# Patient Record
Sex: Male | Born: 1955
Health system: Southern US, Community
[De-identification: ages and names within clinical notes are randomized; demographics above are authoritative.]

## PROBLEM LIST (undated history)

## (undated) DIAGNOSIS — K219 Gastro-esophageal reflux disease without esophagitis: Secondary | ICD-10-CM

## (undated) DIAGNOSIS — B1920 Unspecified viral hepatitis C without hepatic coma: Secondary | ICD-10-CM

## (undated) DIAGNOSIS — F17200 Nicotine dependence, unspecified, uncomplicated: Secondary | ICD-10-CM

## (undated) DIAGNOSIS — F1010 Alcohol abuse, uncomplicated: Secondary | ICD-10-CM

## (undated) DIAGNOSIS — Z87892 Personal history of anaphylaxis: Secondary | ICD-10-CM

## (undated) DIAGNOSIS — I5042 Chronic combined systolic (congestive) and diastolic (congestive) heart failure: Secondary | ICD-10-CM

## (undated) DIAGNOSIS — F1410 Cocaine abuse, uncomplicated: Secondary | ICD-10-CM

## (undated) DIAGNOSIS — G4730 Sleep apnea, unspecified: Secondary | ICD-10-CM

## (undated) DIAGNOSIS — I428 Other cardiomyopathies: Secondary | ICD-10-CM

## (undated) DIAGNOSIS — I10 Essential (primary) hypertension: Secondary | ICD-10-CM

## (undated) HISTORY — DX: Morbid (severe) obesity due to excess calories: E66.01

## (undated) HISTORY — DX: Cocaine abuse, uncomplicated: F14.10

## (undated) HISTORY — DX: Nicotine dependence, unspecified, uncomplicated: F17.200

## (undated) HISTORY — DX: Personal history of anaphylaxis: Z87.892

## (undated) HISTORY — DX: Unspecified viral hepatitis C without hepatic coma: B19.20

## (undated) HISTORY — DX: Alcohol abuse, uncomplicated: F10.10

## (undated) HISTORY — PX: NO PAST SURGERIES: SHX2092

## (undated) HISTORY — DX: Other cardiomyopathies: I42.8

## (undated) HISTORY — DX: Chronic combined systolic (congestive) and diastolic (congestive) heart failure: I50.42

---

## 2000-11-13 ENCOUNTER — Emergency Department (HOSPITAL_COMMUNITY): Admission: EM | Admit: 2000-11-13 | Discharge: 2000-11-13 | Payer: Self-pay | Admitting: Emergency Medicine

## 2001-06-13 ENCOUNTER — Emergency Department (HOSPITAL_COMMUNITY): Admission: EM | Admit: 2001-06-13 | Discharge: 2001-06-13 | Payer: Self-pay

## 2002-04-21 ENCOUNTER — Emergency Department (HOSPITAL_COMMUNITY): Admission: EM | Admit: 2002-04-21 | Discharge: 2002-04-21 | Payer: Self-pay | Admitting: Emergency Medicine

## 2002-04-21 ENCOUNTER — Encounter: Payer: Self-pay | Admitting: Emergency Medicine

## 2002-12-11 ENCOUNTER — Emergency Department (HOSPITAL_COMMUNITY): Admission: AD | Admit: 2002-12-11 | Discharge: 2002-12-11 | Payer: Self-pay | Admitting: Family Medicine

## 2003-03-17 ENCOUNTER — Emergency Department (HOSPITAL_COMMUNITY): Admission: EM | Admit: 2003-03-17 | Discharge: 2003-03-17 | Payer: Self-pay | Admitting: Emergency Medicine

## 2003-04-17 ENCOUNTER — Emergency Department (HOSPITAL_COMMUNITY): Admission: EM | Admit: 2003-04-17 | Discharge: 2003-04-17 | Payer: Self-pay | Admitting: Emergency Medicine

## 2003-05-04 ENCOUNTER — Emergency Department (HOSPITAL_COMMUNITY): Admission: EM | Admit: 2003-05-04 | Discharge: 2003-05-04 | Payer: Self-pay | Admitting: Emergency Medicine

## 2003-05-21 ENCOUNTER — Emergency Department (HOSPITAL_COMMUNITY): Admission: EM | Admit: 2003-05-21 | Discharge: 2003-05-21 | Payer: Self-pay

## 2003-09-22 ENCOUNTER — Emergency Department (HOSPITAL_COMMUNITY): Admission: EM | Admit: 2003-09-22 | Discharge: 2003-09-22 | Payer: Self-pay | Admitting: Emergency Medicine

## 2004-02-20 ENCOUNTER — Emergency Department (HOSPITAL_COMMUNITY): Admission: EM | Admit: 2004-02-20 | Discharge: 2004-02-20 | Payer: Self-pay | Admitting: Emergency Medicine

## 2004-05-25 ENCOUNTER — Emergency Department (HOSPITAL_COMMUNITY): Admission: EM | Admit: 2004-05-25 | Discharge: 2004-05-25 | Payer: Self-pay | Admitting: Emergency Medicine

## 2005-05-07 ENCOUNTER — Emergency Department (HOSPITAL_COMMUNITY): Admission: EM | Admit: 2005-05-07 | Discharge: 2005-05-07 | Payer: Self-pay | Admitting: Emergency Medicine

## 2005-05-31 ENCOUNTER — Emergency Department (HOSPITAL_COMMUNITY): Admission: EM | Admit: 2005-05-31 | Discharge: 2005-05-31 | Payer: Self-pay | Admitting: Emergency Medicine

## 2005-06-06 ENCOUNTER — Emergency Department (HOSPITAL_COMMUNITY): Admission: EM | Admit: 2005-06-06 | Discharge: 2005-06-06 | Payer: Self-pay | Admitting: Emergency Medicine

## 2005-08-18 ENCOUNTER — Emergency Department (HOSPITAL_COMMUNITY): Admission: EM | Admit: 2005-08-18 | Discharge: 2005-08-18 | Payer: Self-pay | Admitting: Emergency Medicine

## 2005-12-12 ENCOUNTER — Emergency Department (HOSPITAL_COMMUNITY): Admission: EM | Admit: 2005-12-12 | Discharge: 2005-12-12 | Payer: Self-pay | Admitting: Emergency Medicine

## 2006-08-20 ENCOUNTER — Emergency Department (HOSPITAL_COMMUNITY): Admission: EM | Admit: 2006-08-20 | Discharge: 2006-08-20 | Payer: Self-pay | Admitting: Emergency Medicine

## 2006-08-28 ENCOUNTER — Emergency Department (HOSPITAL_COMMUNITY): Admission: EM | Admit: 2006-08-28 | Discharge: 2006-08-28 | Payer: Self-pay | Admitting: Emergency Medicine

## 2006-09-26 ENCOUNTER — Emergency Department (HOSPITAL_COMMUNITY): Admission: EM | Admit: 2006-09-26 | Discharge: 2006-09-26 | Payer: Self-pay | Admitting: Emergency Medicine

## 2006-12-19 ENCOUNTER — Emergency Department (HOSPITAL_COMMUNITY): Admission: EM | Admit: 2006-12-19 | Discharge: 2006-12-19 | Payer: Self-pay | Admitting: Emergency Medicine

## 2007-07-11 ENCOUNTER — Emergency Department (HOSPITAL_COMMUNITY): Admission: EM | Admit: 2007-07-11 | Discharge: 2007-07-11 | Payer: Self-pay | Admitting: Emergency Medicine

## 2008-02-07 ENCOUNTER — Emergency Department (HOSPITAL_COMMUNITY): Admission: EM | Admit: 2008-02-07 | Discharge: 2008-02-07 | Payer: Self-pay | Admitting: Emergency Medicine

## 2008-02-11 ENCOUNTER — Emergency Department (HOSPITAL_COMMUNITY): Admission: EM | Admit: 2008-02-11 | Discharge: 2008-02-11 | Payer: Self-pay | Admitting: Emergency Medicine

## 2008-10-03 ENCOUNTER — Emergency Department (HOSPITAL_COMMUNITY): Admission: EM | Admit: 2008-10-03 | Discharge: 2008-10-03 | Payer: Self-pay | Admitting: Emergency Medicine

## 2008-11-03 ENCOUNTER — Emergency Department (HOSPITAL_COMMUNITY): Admission: EM | Admit: 2008-11-03 | Discharge: 2008-11-03 | Payer: Self-pay | Admitting: Emergency Medicine

## 2009-02-20 ENCOUNTER — Observation Stay (HOSPITAL_COMMUNITY): Admission: EM | Admit: 2009-02-20 | Discharge: 2009-02-22 | Payer: Self-pay | Admitting: Emergency Medicine

## 2009-02-21 ENCOUNTER — Encounter (INDEPENDENT_AMBULATORY_CARE_PROVIDER_SITE_OTHER): Payer: Self-pay | Admitting: Internal Medicine

## 2009-02-22 ENCOUNTER — Encounter (INDEPENDENT_AMBULATORY_CARE_PROVIDER_SITE_OTHER): Payer: Self-pay | Admitting: Family Medicine

## 2009-05-15 ENCOUNTER — Encounter (INDEPENDENT_AMBULATORY_CARE_PROVIDER_SITE_OTHER): Payer: Self-pay | Admitting: Emergency Medicine

## 2009-05-15 ENCOUNTER — Inpatient Hospital Stay (HOSPITAL_COMMUNITY): Admission: EM | Admit: 2009-05-15 | Discharge: 2009-05-22 | Payer: Self-pay | Admitting: Emergency Medicine

## 2009-05-15 ENCOUNTER — Ambulatory Visit: Payer: Self-pay | Admitting: Cardiology

## 2009-05-15 DIAGNOSIS — I428 Other cardiomyopathies: Secondary | ICD-10-CM

## 2009-05-15 LAB — CONVERTED CEMR LAB
Cholesterol: 191 mg/dL
HDL: 33 mg/dL
LDL Cholesterol: 104 mg/dL
TSH: 1.073 microintl units/mL
Total CHOL/HDL Ratio: 5.8
Triglycerides: 271 mg/dL
VLDL: 54 mg/dL

## 2009-05-20 LAB — CONVERTED CEMR LAB
HCT: 38.7 %
Hemoglobin: 13.1 g/dL
MCHC: 33.9 g/dL
MCV: 94.3 fL
Platelets: 286 10*3/uL
RBC: 4.1 M/uL
RDW: 14.1 %
WBC: 6.9 10*3/uL

## 2009-05-21 LAB — CONVERTED CEMR LAB
BUN: 14 mg/dL
CO2: 26 meq/L
Calcium: 10.1 mg/dL
Chloride: 101 meq/L
Creatinine, Ser: 1.23 mg/dL
Glucose, Bld: 101 mg/dL
Potassium: 4.4 meq/L
Sodium: 135 meq/L

## 2009-05-23 DIAGNOSIS — I5021 Acute systolic (congestive) heart failure: Secondary | ICD-10-CM

## 2009-06-04 ENCOUNTER — Ambulatory Visit: Payer: Self-pay | Admitting: Internal Medicine

## 2009-06-04 ENCOUNTER — Encounter (INDEPENDENT_AMBULATORY_CARE_PROVIDER_SITE_OTHER): Payer: Self-pay | Admitting: Nurse Practitioner

## 2009-06-04 DIAGNOSIS — F1010 Alcohol abuse, uncomplicated: Secondary | ICD-10-CM

## 2009-06-04 DIAGNOSIS — I10 Essential (primary) hypertension: Secondary | ICD-10-CM

## 2009-06-04 DIAGNOSIS — F17200 Nicotine dependence, unspecified, uncomplicated: Secondary | ICD-10-CM

## 2009-06-04 DIAGNOSIS — N179 Acute kidney failure, unspecified: Secondary | ICD-10-CM | POA: Insufficient documentation

## 2009-06-08 ENCOUNTER — Ambulatory Visit: Payer: Self-pay | Admitting: Cardiology

## 2009-06-09 ENCOUNTER — Emergency Department (HOSPITAL_COMMUNITY): Admission: EM | Admit: 2009-06-09 | Discharge: 2009-06-10 | Payer: Self-pay | Admitting: Emergency Medicine

## 2009-06-10 ENCOUNTER — Telehealth: Payer: Self-pay | Admitting: Cardiology

## 2009-06-15 ENCOUNTER — Encounter (INDEPENDENT_AMBULATORY_CARE_PROVIDER_SITE_OTHER): Payer: Self-pay | Admitting: Nurse Practitioner

## 2009-06-21 ENCOUNTER — Telehealth (INDEPENDENT_AMBULATORY_CARE_PROVIDER_SITE_OTHER): Payer: Self-pay | Admitting: *Deleted

## 2009-06-21 ENCOUNTER — Encounter: Payer: Self-pay | Admitting: Cardiology

## 2009-06-22 ENCOUNTER — Encounter: Payer: Self-pay | Admitting: Cardiology

## 2009-06-25 ENCOUNTER — Encounter (INDEPENDENT_AMBULATORY_CARE_PROVIDER_SITE_OTHER): Payer: Self-pay | Admitting: Nurse Practitioner

## 2009-06-29 ENCOUNTER — Ambulatory Visit: Payer: Self-pay | Admitting: Cardiology

## 2009-07-06 LAB — CONVERTED CEMR LAB
Calcium: 9.8 mg/dL (ref 8.4–10.5)
Creatinine, Ser: 1.4 mg/dL (ref 0.4–1.5)
GFR calc non Af Amer: 70.01 mL/min (ref >60)
Glucose, Bld: 91 mg/dL (ref 70–99)
Sodium: 141 meq/L (ref 135–145)

## 2009-07-08 ENCOUNTER — Ambulatory Visit: Payer: Self-pay | Admitting: Cardiology

## 2009-07-09 ENCOUNTER — Telehealth: Payer: Self-pay | Admitting: Cardiology

## 2009-07-13 ENCOUNTER — Telehealth (INDEPENDENT_AMBULATORY_CARE_PROVIDER_SITE_OTHER): Payer: Self-pay | Admitting: *Deleted

## 2009-08-03 ENCOUNTER — Ambulatory Visit: Payer: Self-pay | Admitting: Cardiology

## 2009-08-03 DIAGNOSIS — I5042 Chronic combined systolic (congestive) and diastolic (congestive) heart failure: Secondary | ICD-10-CM

## 2009-08-06 LAB — CONVERTED CEMR LAB
BUN: 20 mg/dL (ref 6–23)
Calcium: 9.6 mg/dL (ref 8.4–10.5)
GFR calc non Af Amer: 97.26 mL/min (ref >60)
Potassium: 4.8 meq/L (ref 3.5–5.1)
Pro B Natriuretic peptide (BNP): 52.6 pg/mL (ref 0.0–100.0)
Sodium: 138 meq/L (ref 135–145)

## 2009-09-06 ENCOUNTER — Ambulatory Visit: Payer: Self-pay | Admitting: Cardiology

## 2009-10-01 ENCOUNTER — Telehealth: Payer: Self-pay | Admitting: Cardiology

## 2009-10-17 ENCOUNTER — Emergency Department (HOSPITAL_COMMUNITY): Admission: EM | Admit: 2009-10-17 | Discharge: 2009-10-17 | Payer: Self-pay | Admitting: Emergency Medicine

## 2009-10-19 ENCOUNTER — Ambulatory Visit: Payer: Self-pay | Admitting: Cardiology

## 2009-10-19 LAB — CONVERTED CEMR LAB
BUN: 28 mg/dL — ABNORMAL HIGH (ref 6–23)
Calcium: 9.5 mg/dL (ref 8.4–10.5)
GFR calc non Af Amer: 67.64 mL/min (ref >60)
Glucose, Bld: 98 mg/dL (ref 70–99)
Potassium: 4.8 meq/L (ref 3.5–5.1)

## 2009-11-05 ENCOUNTER — Ambulatory Visit: Payer: Self-pay | Admitting: Cardiology

## 2009-11-05 DIAGNOSIS — I5023 Acute on chronic systolic (congestive) heart failure: Secondary | ICD-10-CM

## 2009-11-10 LAB — CONVERTED CEMR LAB
CO2: 26 meq/L (ref 19–32)
Calcium: 9.5 mg/dL (ref 8.4–10.5)
Creatinine, Ser: 1.3 mg/dL (ref 0.4–1.5)
GFR calc non Af Amer: 77.71 mL/min (ref >60)
Sodium: 136 meq/L (ref 135–145)

## 2009-12-09 ENCOUNTER — Ambulatory Visit: Payer: Self-pay | Admitting: Cardiology

## 2009-12-21 ENCOUNTER — Ambulatory Visit (HOSPITAL_COMMUNITY)
Admission: RE | Admit: 2009-12-21 | Discharge: 2009-12-21 | Payer: Self-pay | Source: Home / Self Care | Attending: Cardiology | Admitting: Cardiology

## 2009-12-21 ENCOUNTER — Ambulatory Visit: Payer: Self-pay | Admitting: Cardiology

## 2009-12-21 ENCOUNTER — Ambulatory Visit: Payer: Self-pay | Admitting: Internal Medicine

## 2009-12-21 ENCOUNTER — Encounter: Payer: Self-pay | Admitting: Physician Assistant

## 2009-12-21 ENCOUNTER — Ambulatory Visit: Payer: Self-pay

## 2009-12-21 ENCOUNTER — Encounter: Payer: Self-pay | Admitting: Cardiology

## 2009-12-21 DIAGNOSIS — R079 Chest pain, unspecified: Secondary | ICD-10-CM

## 2009-12-27 LAB — CONVERTED CEMR LAB
BUN: 20 mg/dL (ref 6–23)
Chloride: 100 meq/L (ref 96–112)
GFR calc non Af Amer: 83.83 mL/min (ref >60.00)
Potassium: 4.9 meq/L (ref 3.5–5.1)
Pro B Natriuretic peptide (BNP): 25.1 pg/mL (ref 0.0–100.0)
Sodium: 138 meq/L (ref 135–145)

## 2010-01-07 ENCOUNTER — Ambulatory Visit: Payer: Self-pay | Admitting: Internal Medicine

## 2010-01-17 ENCOUNTER — Encounter (INDEPENDENT_AMBULATORY_CARE_PROVIDER_SITE_OTHER): Payer: Self-pay | Admitting: *Deleted

## 2010-02-06 LAB — CONVERTED CEMR LAB
BUN: 19 mg/dL (ref 6–23)
Creatinine, Ser: 1.2 mg/dL (ref 0.4–1.5)
GFR calc non Af Amer: 80.05 mL/min (ref >60)
Glucose, Bld: 104 mg/dL — ABNORMAL HIGH (ref 70–99)

## 2010-02-08 NOTE — Medication Information (Signed)
Summary: Prescription Refill   Prescription Refill   Imported By: Roderic Ovens 10/01/2009 15:22:45  _____________________________________________________________________  External Attachment:    Type:   Image     Comment:   External Document

## 2010-02-08 NOTE — Assessment & Plan Note (Signed)
Summary: 1 MONTH/D.MILLER   Visit Type:  1 month follow up Primary Provider:  [name]  CC:  fatigued, a lot of gas -CP, dizziness, and swelling in hands.  History of Present Illness: 55 yo with history of nonischemic cardiomyopathy presents for cardiology followup.  Mr. Koors had a prolonged admission in 5/11 for CHF.  Echo showed EF 15-20% with diffuse hypokinesis.  Left and right heart cath showed no angiographic CAD and Fick CI 1.47.  Patient became hypotensive with ARF (cardiogenic shock) requiring milrinone and dopamine.  He was gradually titrated off these medications and begun on an oral regimen.   Unfortunately, he is still smoking 1/2 ppd.  He has only drunk ETOH once since I last saw him though (at Thanksgiving had about 1/2 pint liquor).  He has stable dyspnea after walking 1 block and mild shortness of breath with 1 flight of steps.  He is able to vacuum the house without problems.  He has gained 10 lbs since I last saw him and says that he has been eating more and has not been very active.   Labs (5/11): K 4.4, creatinine 1.23, LDL 104, HDL 33 Labs (6/11): BNP 97, K 4.8, creatinine 1.4 Labs (7/11): K 4.8, creatinine 1.0, BNP 53 Labs (10/11): BNP 25, K 4.8 => 4.3, creatinine 1.4 => 1.3  ECG: NSR, nonspecific T wave flattening  Current Medications (verified): 1)  Aspir-Low 81 Mg Tbec (Aspirin) .... Take One Tablet By Mouth Daily 2)  Coreg 12.5 Mg Tabs (Carvedilol) .... One Twice A Day 3)  Multivitamins  Tabs (Multiple Vitamin) .... Take One Tablet By Mouth Daily 4)  Spironolactone 25 Mg Tabs (Spironolactone) .... One -Half Tablet Daily 5)  Acetaminophen 325 Mg  Tabs (Acetaminophen) .... As Needed-Pt Taking 3 Tablets At A Time and Has Been Taking Every Day 6)  Proventil Hfa 108 (90 Base) Mcg/act Aers (Albuterol Sulfate) .... As Needed 7)  Ambien 5 Mg Tabs (Zolpidem Tartrate) .... One At Bedtime As Needed For Sleep 8)  Chantix Starting Month Pak 0.5 Mg X 11 & 1 Mg X 42  Tabs (Varenicline Tartrate) .... Take As Directed 9)  Chantix Continuing Month Pak 1 Mg Tabs (Varenicline Tartrate) .... Take As Directed 10)  Meclizine Hcl 25 Mg Tabs (Meclizine Hcl) .... Take One Tablet As Needed For Dizziness 11)  Lasix 40 Mg Tabs (Furosemide) .... Once Daily 12)  Lisinopril 2.5 Mg Tabs (Lisinopril) .... One Twice A Day 13)  Ibuprofen 800 Mg Tabs (Ibuprofen) .... Three Times A Day, As Needed 14)  Qc Stool Softener 100 Mg Caps (Docusate Sodium) .... Two Times A Day, As Needed 15)  Potassium Chloride Cr 10 Meq Cr-Caps (Potassium Chloride) .... Take One Tablet By Mouth Daily  Allergies (verified): 1)  ! Penicillin  Past History:  Past Medical History: Reviewed history from 06/08/2009 and no changes required. 1. Nonischemic Cardiomyopathy: Admitted 5/11 with acute systolic CHF.  LHC showed no angiographic CAD.  Echo (5/11) showed EF 15-20% with diffuse severe hypokinesis, moderate MR.  RHC showed PCWP 10, CI 1.47 (Fick).  Cause of CMP thought to be heavy ETOH and cocaine.  2. ETOH abuse: Quit drinking after 5/11 hospitalization 3. Cocaine abuse: Quit in 2011 4. Active smoker: 1 ppd 5. Obesity  Family History: Reviewed history from 06/08/2009 and no changes required. 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  Prior heavy ETOH but states he  quit after 5/11 hospitalization.  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   Review of Systems       All systems reviewed and negative except as per HPI.   Vital Signs:  Patient profile:   55 year old male Height:      68 inches Weight:      238 pounds BMI:     36.32 Pulse rate:   76 / minute BP sitting:   118 / 78  (left arm) Cuff size:   regular  Vitals Entered By: Caralee Ates CMA (December 09, 2009 10:55 AM)  Physical Exam  General:  Well developed, well nourished, in no acute distress. Obese.  Neck:  Neck thick, no JVD. No  masses, thyromegaly or abnormal cervical nodes. Lungs:  Clear bilaterally to auscultation and percussion. Heart:  Non-displaced PMI, chest non-tender; regular rate and rhythm, S1, S2. +S4. 1/6 HSM apex.  Carotid upstroke normal, no bruit.  Pedals normal pulses. No edema, no varicosities. Abdomen:  Bowel sounds positive; abdomen soft and non-tender without masses, organomegaly, or hernias noted. No hepatosplenomegaly. Extremities:  No clubbing or cyanosis. Neurologic:  Alert and oriented x 3. Psych:  Normal affect.   Impression & Recommendations:  Problem # 1:  CHRONIC SYSTOLIC HEART FAILURE (ICD-428.22) Nonischemic CMP, likely from substance abuse (ETOH, cocaine).  He continues to remain essentially abstinent from ETOH and cocaine.  He appears euvolemic; I suspect that his weight gain is due to a sedentary lifestyle with overeating.  He has NYHA class II-III symptoms.   - Continue current Lasix and Coreg doses.  - Increase spironolactone to 25 mg daily and stop KCl.   - Increase lisinopril to 5 mg two times a day.  - Repeat echo later this month.  If EF is < 35% and he continues to be abstinent from ETOH and cocaine, will refer for ICD.  - He needs to be more active.  I recommended walking 30-30 minutes 4-5 times a week.  - BMET/BNP in 10 days.   Problem # 2:  TOBACCO ABUSE (ICD-305.1) Advised to quit. He plans to try nicotine patches.   Patient Instructions: 1)  Your physician has recommended you make the following change in your medication:  2)  Increase Lisinopril to 5mg  twice a day--you can take two 2.5mg  tablets twice a day. 3)  Increase Spironolactone to 25mg  daily. 4)  Stop KCL(potassium). 5)  Lab on December 13 when you have your echocardiogram---BNP/BMP 428.22 6)  Your physician recommends that you schedule a follow-up appointment in: 3 months with Dr Shirlee Latch. Prescriptions: LISINOPRIL 5 MG TABS (LISINOPRIL) one twice a day  #60 x 6   Entered by:   Katina Dung, RN, BSN    Authorized by:   [name], MD   Signed by:   Katina Dung, RN, BSN on 12/09/2009   Method used:   Electronically to        [facility]. [address] [phone]* (retail)       [address]       [place], [place]  [address]       Ph: [phone] or [phone]       Fax: [phone]   RxID:   [id number] SPIRONOLACTONE 25 MG TABS (SPIRONOLACTONE) one  tablet daily  #30 x 6   Entered by:   Katina Dung, RN, BSN   Authorized by:   [name], MD   Signed by:   Katina Dung, RN,  BSN on 12/09/2009   Method used:   Electronically to        [facility]. [address] [phone]* (retail)       [address]       [place], [place]  [address]       Ph: [phone] or [phone]       Fax: [phone]   RxID:   [id number]

## 2010-02-08 NOTE — Assessment & Plan Note (Signed)
Summary: 6wk f/u sl   Primary Provider:  [name]   History of Present Illness: 55 yo with history of nonischemic cardiomyopathy and recent admission with acute systolic CHF presentsfor cardiology followup.  Mr. Muehl had a prolonged admission in 5/11 for CHF.  Echo showed EF 15-20% with diffuse hypokinesis.  Left and right heart cath showed no angiographic CAD and Fick CI 1.47.  Patient became hypotensive with ARF (cardiogenic shock) requiring milrinone and dopamine.  He was gradually titrated off these medications and begun on a by mouth regimen.   He is doing fairly well.  Unfortunately, he continues to smoke 1/2 ppd (Chantix did not help much).  He is stably short of breath after walking about 1 block.  He can climb a flight of steps without problem.  No orthopnea or PND.  No chest pain.  No cocaine or ETOH use.  He is only taking lisinopril once a day.   Labs (5/11): K 4.4, creatinine 1.23, LDL 104, HDL 33 Labs (6/11): BNP 97, K 4.8, creatinine 1.4 Labs (7/11): K 4.8, creatinine 1.0, BNP 53 Labs (10/11): BNP 25, K 4.8, creatinine 1.4  ECG: NSR, mildly prolonged QTc  Current Medications (verified): 1)  Aspir-Low 81 Mg Tbec (Aspirin) .... Take One Tablet By Mouth Daily 2)  Coreg 6.25 Mg Tabs (Carvedilol) .... One and One-Half Twice A Day 3)  Multivitamins  Tabs (Multiple Vitamin) .... Take One Tablet By Mouth Daily 4)  Spironolactone 25 Mg Tabs (Spironolactone) .... One -Half Tablet Daily 5)  Acetaminophen 325 Mg  Tabs (Acetaminophen) .... As Needed-Pt Taking 3 Tablets At A Time and Has Been Taking Every Day 6)  Proventil Hfa 108 (90 Base) Mcg/act Aers (Albuterol Sulfate) .... As Needed 7)  Ambien 5 Mg Tabs (Zolpidem Tartrate) .... One At Bedtime As Needed For Sleep 8)  Chantix Starting Month Pak 0.5 Mg X 11 & 1 Mg X 42 Tabs (Varenicline Tartrate) .... Take As Directed 9)  Chantix Continuing Month Pak 1 Mg Tabs (Varenicline Tartrate) .... Take As Directed 10)  Meclizine Hcl 25  Mg Tabs (Meclizine Hcl) .... Take One Tablet As Needed For Dizziness 11)  Furosemide 20 Mg Tabs (Furosemide) .... Take One Tablet Once Daily 12)  Lisinopril 2.5 Mg Tabs (Lisinopril) .... One Twice A Day  Allergies (verified): 1)  ! Penicillin  Past History:  Past Medical History: Reviewed history from 06/08/2009 and no changes required. 1. Nonischemic Cardiomyopathy: Admitted 5/11 with acute systolic CHF.  LHC showed no angiographic CAD.  Echo (5/11) showed EF 15-20% with diffuse severe hypokinesis, moderate MR.  RHC showed PCWP 10, CI 1.47 (Fick).  Cause of CMP thought to be heavy ETOH and cocaine.  2. ETOH abuse: Quit drinking after 5/11 hospitalization 3. Cocaine abuse: Quit in 2011 4. Active smoker: 1 ppd 5. Obesity  Family History: Reviewed history from 06/08/2009 and no changes required. 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  Prior heavy ETOH but states he quit after 5/11 hospitalization.  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   Review of Systems       All systems reviewed and negative except as per HPI.   Vital Signs:  Patient profile:   55 year old male Height:      68 inches Weight:      228 pounds BMI:     34.79 Pulse rate:   92 / minute Resp:     18 per minute BP sitting:  142 / 78  (left arm)  Vitals Entered By: Marrion Coy, CNA (October 19, 2009 10:32 AM)  Physical Exam  General:  Well developed, well nourished, in no acute distress. Obese.  Neck:  Neck thick, no JVD. No masses, thyromegaly or abnormal cervical nodes. Lungs:  Clear bilaterally to auscultation and percussion. Heart:  Non-displaced PMI, chest non-tender; regular rate and rhythm, S1, S2. +S4. 1/6 HSM apex.  Carotid upstroke normal, no bruit.  Pedals normal pulses. No edema, no varicosities. Abdomen:  Bowel sounds positive; abdomen soft and non-tender without masses, organomegaly, or hernias noted. No hepatosplenomegaly. Extremities:  No clubbing or  cyanosis. Neurologic:  Alert and oriented x 3. Psych:  Normal affect.   Impression & Recommendations:  Problem # 1:  CHRONIC SYSTOLIC HEART FAILURE (ICD-428.22) Nonischemic CMP, likely from substance abuse (ETOH, cocaine).  He continues to remain abstinent from ETOH and cocaine.  His volume status looks ok, BNP is not elevated.  He has NYHA class II-III symptoms.   - Continue current Lasix and spironolactone doses - Increase Coreg to 12.5 mg two times a day.  - Increase lisinopril  to 2.5 mg two times a day, BMET in 2 wks - Repeat Echo in 12/11.  I will see him again in 4 weeks and will hopefully again increase his Coreg and lisinopril.  If EF is < 35% in 12/11 and he continues to be abstinent from ETOH and cocaine, will refer for ICD.   Problem # 2:  TOBACCO ABUSE (ICD-305.1) Advised to quit.  Failed Chantix.  Will consider nicotine patches.   Other Orders: Echocardiogram (Echo) TLB-BNP (B-Natriuretic Peptide) (83880-BNPR) TLB-BMP (Basic Metabolic Panel-BMET) (80048-METABOL)  Patient Instructions: 1)  Your physician has recommended you make the following change in your medication:  2)  Increase Coreg(carvedilol) to 12.5mg  twice a day--you can take two  6.25mg  tablets twice a day. 3)  Increase Lisinopril to 2.5mg  twice a day. 4)  Lab today--BMP/BNP 428.23 5)  Lab in 2 weeks---BMP 428.23 6)  Your physician recommends that you schedule a follow-up appointment in: 1 month with Dr Shirlee Latch. 7)  Your physician has requested that you have an echocardiogram.  Echocardiography is a painless test that uses sound waves to create images of your heart. It provides your doctor with information about the size and shape of your heart and how well your heart's chambers and valves are working.  This procedure takes approximately one hour. There are no restrictions for this procedure. DECEMBER 2011 Prescriptions: LISINOPRIL 2.5 MG TABS (LISINOPRIL) one twice a day  #60 x 6   Entered by:   Katina Dung,  RN, BSN   Authorized by:   [name], MD   Signed by:   Katina Dung, RN, BSN on 10/19/2009   Method used:   Electronically to        [facility]. [address] [phone]* (retail)       [address]       [place], [place]  [address]       Ph: [phone] or [phone]       Fax: [phone]   RxID:   [id number] COREG 12.5 MG TABS (CARVEDILOL) one twice a day  #60 x 6   Entered by:   Katina Dung, RN, BSN   Authorized by:   [name], MD   Signed by:   Katina Dung, RN, BSN on 10/19/2009   Method used:   Electronically to  [facility] [address] [phone]* (retail)       [address]       [place], [place]  [address]       Ph: [phone] or [phone]       Fax: [phone]   RxID:   [id number]

## 2010-02-08 NOTE — Progress Notes (Signed)
Summary: QUESTION ABOUT B/P MEDICATION  Phone Note Call from Patient Call back at [phone]   Caller: Patient Summary of Call: PT HAVE QUESTION ABOUT B/P MEDICATION Initial call taken by: Judie Grieve,  July 09, 2009 2:24 PM  Follow-up for Phone Call        Spoke with pt. Patient would like to know if he can take  Benicar HCT 40/12.5 mg instead of filling the new prescription given on 07/08/09. Pt. was taken this medication  prior going to hospital in May/11 and he has the medication at home. I let pt. know Benicar Hct was discontinue on dischage from the  hospital. He needs to fill the prescriptions given to him  at the last  clinic  visit. Pt. verbalized understanding. Follow-up by: Ollen Gross, RN, BSN,  July 09, 2009 3:05 PM

## 2010-02-08 NOTE — Assessment & Plan Note (Signed)
Summary: NEW - Hospital F/u   Vital Signs:  Patient profile:   55 year old male Height:      65.25 inches Weight:      218.1 pounds BMI:     36.15 BSA:     2.06 Temp:     98.3 degrees F oral Pulse rate:   69 / minute Pulse rhythm:   regular Resp:     20 per minute BP sitting:   104 / 78  (left arm) Cuff size:   regular  Vitals Entered By: Levon Hedger (Jun 04, 2009 9:43 AM) CC: follow-up visit [facility] hospital..Marland KitchenMarland KitchenMarland Kitchenpt states when he takes all of his medication he gets a real bad head, CHF Management Is Patient Diabetic? No Pain Assessment Patient in pain? yes     Location: head Intensity: 9  Does patient need assistance? Functional Status Self care Ambulation Normal   CC:  follow-up visit [facility] hospital..Marland KitchenMarland KitchenMarland Kitchenpt states when he takes all of his medication he gets a real bad head and CHF Management.  History of Present Illness:  Pt into the office to establish care Pt was previously a pt at [facility] - Pt stopped going due to lapse in Medicaid  Hospitalized from 05/15/2009 to 05/22/2009  1.Nonischemic Cardiomyopathy s/p episode of cardiogneic shock and alcholol withdrawl  2.  Systolic congeistive heart failure - admitted with BMP of 632. Echo of 15-20% with diffuse hypokinesis and grade 1 diastolic dysfunction as well as moderate mitral regurgitation with eccentric jet. Right and left cardiac cath done by Dr. Gala Romney - normal coronary arteries but during cath developed severe cardiogenic shock with acute decompensated heart failure.  Dopamine and milrinone drip started.    3.  Acute renal failure - likely secondary to cardiogenic shock and contrast dye.  Creatine trended down after drips d/c'd.  4.  ETOH abuse - started on CIWA for ETOG withdrawls.  He did hoave some visual hallucinations.  slight agitation on day of d/c  Pt presents today with all his medications. Provider reviewed meds with pt      CHF History:      He complains of headache, but denies  chest pain, palpitations, dyspnea with exertion, and peripheral edema.  Daily weights are not being checked.  He understands fluid management and sodium restriction and is following this regimen.  The patient expresses understanding of the treatment plan and his medications.  He admits to being compliant with his medications.  ADL's are being done without symptoms or restrictions.  The patient has not been enrolled in the CHF Eduction Program.  Other comments include: Pt has been complaint with meds since recent hospital d/c.     Habits & Providers  Alcohol-Tobacco-Diet     Alcohol drinks/day: 1     Alcohol Counseling: to decrease amount and/or frequency of alcohol intake     Alcohol type: all     Tobacco Status: current     Tobacco Counseling: to quit use of tobacco products     Cigarette Packs/Day: 0.25     Year Started: age 30  Exercise-Depression-Behavior     Does Patient Exercise: no     Drug Use: past- cocaine  Allergies (verified): 1)  ! Penicillin swxx Family History:  mother - htn father - htn  Social History:  Tobacco - 1 ppd ETOH -  Drug - Smoking Status:  current Packs/Day:  0.25 Drug Use:  past- cocaine Does Patient Exercise:  no  Review of Systems CV:  Denies chest pain  or discomfort. Resp:  Denies cough and shortness of breath. GI:  Denies abdominal pain, nausea, and vomiting. Neuro:  Complains of headaches; usually after he takes his medications he has a headaches that last for 2 hours.  Physical Exam  General:  alert.   Head:  normocephalic.   Lungs:  normal breath sounds.   Heart:  normal rate and regular rhythm.   Msk:  normal ROM.   Neurologic:  alert & oriented X3.   Skin:  color normal.   Psych:  Oriented X3.     Impression & Recommendations:  Problem # 1:  HYPERTENSION, BENIGN ESSENTIAL (ICD-401.1)  His updated medication list for this problem includes:    Carvedilol 3.125 Mg Tabs (Carvedilol) .[name]... Take one tablet by mouth twice a  day    Hydralazine Hcl 10 Mg Tabs (Hydralazine hcl) .[name]... Take one tablet by mouth three times a day    Lasix 40 Mg Tabs (Furosemide) .[name]... Take one tablet by mouth daily    Spironolactone 25 Mg Tabs (Spironolactone) .[name]... Take 1/2 tablet by mouth daily  Problem # 2:  CONGESTIVE HEART FAILURE, ACUTE, SYSTOLIC (ICD-428.21)  His updated medication list for this problem includes:    Aspir-low 81 Mg Tbec (Aspirin) .[name]... Take one tablet by mouth daily    Carvedilol 3.125 Mg Tabs (Carvedilol) .[name]... Take one tablet by mouth twice a day    Lasix 40 Mg Tabs (Furosemide) .[name]... Take one tablet by mouth daily    Spironolactone 25 Mg Tabs (Spironolactone) .[name]... Take 1/2 tablet by mouth daily  Problem # 3:  RENAL FAILURE, ACUTE (ICD-584.9) resolved at hospital d/c  Problem # 4:  TOBACCO ABUSE (ICD-305.1) advised cessation  Problem # 5:  ALCOHOL ABUSE (ICD-305.00) advised cessation  Complete Medication List: 1)  Aspir-low 81 Mg Tbec (Aspirin) .... Take one tablet by mouth daily 2)  Carvedilol 3.125 Mg Tabs (Carvedilol) .... Take one tablet by mouth twice a day 3)  Hydralazine Hcl 10 Mg Tabs (Hydralazine hcl) .... Take one tablet by mouth three times a day 4)  Isosorbide Mononitrate Cr 60 Mg Xr24h-tab (Isosorbide mononitrate) .... Take one tablet by mouth daily 5)  Lasix 40 Mg Tabs (Furosemide) .... Take one tablet by mouth daily 6)  Multivitamins Tabs (Multiple vitamin) .... Take one tablet by mouth daily 7)  Spironolactone 25 Mg Tabs (Spironolactone) .... Take 1/2 tablet by mouth daily 8)  Potassium Chloride Cr 10 Meq Cr-tabs (Potassium chloride) .... One tablet by mouth daily 9)  Omeprazole 20 Mg Cpdr (Omeprazole) .... 2 capsule by mouth daily  CHF Assessment/Plan:      The patient's current weight is 218.1 pounds.  His last cardiac ejection fraction was 15 % on 05/15/2009.    Prior Cardiac Test Results:  Echocardiogram Report:    Date:     05/15/2009    Results:   systolic function was  severely reduced with estimated EF of 15-20%. diffuse hypokinesis.  no wall motion abnormalities.  Grade 1 diastolic dysfunction. moderate mitral regurgitation   Patient Instructions: 1)  Sign a release to get records from [facility] 2)  Keep your appointment with cardiology - it is very important that you keep this appointment.  Take all your medications with you to this appointment. 3)  Schedule an appointment for an orange card 4)  Hydralazine 25mg  - 1/2 tablet by mouth daily 5)  Isosorbide - take 1/2 tablet by mouth two times a day (this is an extended release tablet) but will  try dosing twice per day to see if this improves the headache 6)  [facility] has a pharmacy that you can use if you choose 7)  Follow up with n.martin,fnp in 6 weeks.

## 2010-02-08 NOTE — Assessment & Plan Note (Signed)
Summary: 1 MONTH ROV.SL   Primary Provider:  [name]  CC:  pt states he has been tired and has not been able to sleep at night.  He also reports that he has had aching in right arm that throbs like a toothache.  He has been getting boils all over.  He is taking his chantix but has not quit smoking.  Pt states he needs medication for constipation and ASA.  History of Present Illness: 55 yo with history of nonischemic cardiomyopathy and recent admission with acute systolic CHF presents to establish cardiology care.  Mr. Mccauley a prolonged admission in 5/11 for CHF.  Echo showed EF 15-20% with diffuse hypokinesis.  Left and right heart cath showed no angiographic CAD and Fick CI 1.47.  Patient became hypotensive with ARF (cardiogenic shock) requiring milrinone and dopamine.  He was gradually titrated off these medications and begun on a by mouth regimen.   Patient continues to have problems with medications.  He never started lisinopril.  He is taking Coreg, spironolactone, and Lasix.  Weight is stable.  BNP is lower.  He is still smoking.  He is short of breath after walking about 75-100 yards.  No orthopnea or PND.  He is having a lot of trouble sleeping at night, simply due to an inabililty to fall asleep.  He continues to be abstinent from ETOH and cocaine.   Labs (5/11): K 4.4, creatinine 1.23, LDL 104, HDL 33 Labs (6/11): BNP 97, K 4.8, creatinine 1.4 Labs (7/11): K 4.8, creatinine 1.0, BNP 53   Current Medications (verified): 1)  Aspir-Low 81 Mg Tbec (Aspirin) .... Take One Tablet By Mouth Daily-Pt Not Taking.  He Has Been Out For A Week 2)  Coreg 3.125 Mg Tabs (Carvedilol) .... Take 2 Tablets Two Times A Day 3)  Multivitamins  Tabs (Multiple Vitamin) .... Take One Tablet By Mouth Daily 4)  Spironolactone 25 Mg Tabs (Spironolactone) .... One -Half Tablet Daily 5)  Acetaminophen 325 Mg  Tabs (Acetaminophen) .... As Needed-Pt Taking 3 Tablets At A Time and Has Been Taking Every Day 6)   Proventil Hfa 108 (90 Base) Mcg/act Aers (Albuterol Sulfate) .... As Needed 7)  Ambien 5 Mg Tabs (Zolpidem Tartrate) .... One At Bedtime As Needed For Sleep 8)  Chantix Starting Month Pak 0.5 Mg X 11 & 1 Mg X 42 Tabs (Varenicline Tartrate) .... Take As Directed 9)  Chantix Continuing Month Pak 1 Mg Tabs (Varenicline Tartrate) .... Take As Directed 10)  Meclizine Hcl 25 Mg Tabs (Meclizine Hcl) .... Take One Tablet As Needed For Dizziness 11)  Furosemide 20 Mg Tabs (Furosemide) .... Take One Tablet Once Daily  Allergies (verified): 1)  ! Penicillin  Past History:  Past Medical History: Reviewed history from 06/08/2009 and no changes required. 1. Nonischemic Cardiomyopathy: Admitted 5/11 with acute systolic CHF.  LHC showed no angiographic CAD.  Echo (5/11) showed EF 15-20% with diffuse severe hypokinesis, moderate MR.  RHC showed PCWP 10, CI 1.47 (Fick).  Cause of CMP thought to be heavy ETOH and cocaine.  2. ETOH abuse: Quit drinking after 5/11 hospitalization 3. Cocaine abuse: Quit in 2011 4. Active smoker: 1 ppd 5. Obesity  Family History: Reviewed history from 06/08/2009 and no changes required. "Heart disease" in mother  Social History: Reviewed history from 06/08/2009 and no changes required. Works as Gaffer.  Smokes 1 ppd.  Prior heavy ETOH but states he quit after 5/11 hospitalization.  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  Review of Systems       All systems reviewed and negative except as per HPI.   Vital Signs:  Patient profile:   55 year old male Height:      68 inches Weight:      226 pounds BMI:     34.49 Pulse rate:   91 / minute Pulse rhythm:   regular BP sitting:   128 / 86  (left arm) Cuff size:   regular  Vitals Entered By: Judithe Modest CMA (September 06, 2009 11:12 AM)  Physical Exam  General:  Well developed, well nourished, in no acute distress. Obese.  Neck:  Neck thick, no JVD. No masses, thyromegaly or abnormal cervical nodes. Lungs:  Clear  bilaterally to auscultation and percussion. Heart:  Non-displaced PMI, chest non-tender; regular rate and rhythm, S1, S2. +S4. 1/6 HSM apex.  Carotid upstroke normal, no bruit.  Pedals normal pulses. No edema, no varicosities. Abdomen:  Bowel sounds positive; abdomen soft and non-tender without masses, organomegaly, or hernias noted. No hepatosplenomegaly. Extremities:  No clubbing or cyanosis. Neurologic:  Alert and oriented x 3. Psych:  Normal affect.   Impression & Recommendations:  Problem # 1:  CHRONIC SYSTOLIC HEART FAILURE (ICD-428.22) Nonischemic CMP, likely from substance abuse (ETOH, cocaine).  He continues to remain abstinent from ETOH and cocaine, this is corroborated by his wife.  His volume status looks ok, BNP is not elevated.  He has NYHA class II-III symptoms.   - Continue current Lasix and spironolactone doses - Increase Coreg to 9.375 mg two times a day. - Start lisinopril 2.5 mg two times a day, BMET in 2 wks - Repeat Echo moved back to 12/11 as he has not been on adequate medical therapy long enough to do this in 9/11.  I will see him again in 6 weeks and will hopefully again increase his Coreg and lisinopril.  If EF is < 35% in 12/11 and he continues to be abstinent from ETOH and cocaine, will refer for ICD.   Problem # 2:  TOBACCO ABUSE (ICD-305.1) Again counselled to quit smoking.  He is actually taking Chantix.   Problem # 3:  INSOMNIA I will let him try a low dose of Ambien as needed.   Patient Instructions: 1)  Your physician has recommended you make the following change in your medication:  2)  Increase Coreg(carvedilol) to 9.375mg  twice a day--this will be one and one-half 6.25mg  tablets twice a day. 3)  Start Lisinopril 2.5mg  twice a day. 4)  Use Ambien 10mg  as needed to help you sleep.   5)  Use Tylenol as needed for pain. 6)  Your physician recommends that you schedule a follow-up appointment in: 6 weeks with Dr Shirlee Latch. 7)  Reschedule echo appointment  from September to December. Prescriptions: AMBIEN 10 MG TABS (ZOLPIDEM TARTRATE) one at bedtime as needed for sleep  #30 x 1   Entered by:   Katina Dung, RN, BSN   Authorized by:   [name], MD   Signed by:   Katina Dung, RN, BSN on 09/06/2009   Method used:   Printed then faxed to ...       [facility] [address] [phone]* (retail)       [address]       [place], [place]  [address]       Ph: [phone]       Fax: [phone]   RxID:   [id number] AMBIEN 10 MG TABS (ZOLPIDEM TARTRATE) one at bedtime  as needed for sleep  #30 x 1   Entered by:   Katina Dung, RN, BSN   Authorized by:   [name], MD   Signed by:   Katina Dung, RN, BSN on 09/06/2009   Method used:   Print then Give to Patient   RxID:   [id number] LISINOPRIL 2.5 MG TABS (LISINOPRIL) one twice a day  #60 x 11   Entered by:   Katina Dung, RN, BSN   Authorized by:   [name], MD   Signed by:   Katina Dung, RN, BSN on 09/06/2009   Method used:   Electronically to        [facility] [phone]* (retail)       [address]       [place], [place]  [address]       Ph: [phone]       Fax: [phone]   RxID:   [id number] COREG 6.25 MG TABS (CARVEDILOL) one and one-half twice a day  #90 x 11   Entered by:   Katina Dung, RN, BSN   Authorized by:   [name], MD   Signed by:   Katina Dung, RN, BSN on 09/06/2009   Method used:   Electronically to        [facility] [phone]* (retail)       [address]       [place], [place]  [address]       Ph: [phone]       Fax: [phone]   RxID:   [id number]

## 2010-02-08 NOTE — Letter (Signed)
Summary: PT INFORMATION SHEET  PT INFORMATION SHEET   Imported By: Arta Bruce 07/19/2009 12:11:05  _____________________________________________________________________  External Attachment:    Type:   Image     Comment:   External Document

## 2010-02-08 NOTE — Progress Notes (Signed)
  Request recieved from DDS sent to [facility]  June 21, 2009 11:18 AM

## 2010-02-08 NOTE — Miscellaneous (Signed)
Summary: Home Health Certification/Care Plan  Home Health Certification/Care Plan   Imported By: Roderic Ovens 07/08/2009 10:03:51  _____________________________________________________________________  External Attachment:    Type:   Image     Comment:   External Document

## 2010-02-08 NOTE — Letter (Signed)
[facility]: [facility]   Imported By: Arta Bruce 09/30/2009 10:59:38  _____________________________________________________________________  External Attachment:    Type:   Image     Comment:   External Document

## 2010-02-08 NOTE — Progress Notes (Signed)
  Request Recieved from DDS sent to [facility]  July 13, 2009 7:55 AM

## 2010-02-08 NOTE — Miscellaneous (Signed)
Summary: Discharge from [facility]  Discharge from [facility]   Imported By: Harlon Flor 06/21/2009 16:05:26  _____________________________________________________________________  External Attachment:    Type:   Image     Comment:   External Document

## 2010-02-08 NOTE — Letter (Signed)
Summary: REQUESTING RECORDS FROM [facility]   REQUESTING RECORDS FROM [facility]   Imported By: Arta Bruce 07/02/2009 12:54:06  _____________________________________________________________________  External Attachment:    Type:   Image     Comment:   External Document

## 2010-02-08 NOTE — Assessment & Plan Note (Signed)
Summary: per check out/sf   Primary Provider:  [name]  CC:  rov/ pt reports some burning in his chest when he walks.  Ethan Wilcox  History of Present Illness: 55 yo with history of nonischemic cardiomyopathy and recent admission with acute systolic CHF presents to establish cardiology care.  [name] a prolonged admission in 5/11 for CHF.  Echo showed EF 15-20% with diffuse hypokinesis.  Left and right heart cath showed no angiographic CAD and Fick CI 1.47.  Patient became hypotensive with ARF (cardiogenic shock) requiring milrinone and dopamine.  He was gradually titrated off these medications and begun on a by mouth regimen.   Patient is having some trouble getting his medications and taking them.  He is out of a number of medications.  He stopped Lasix, hydralazine, and isordil because he thought one of them was causing headaches (likely isordil).  He does not appear to have ever started lisinopril.  He is short of breath after walking about 50 yards or going up a flight of steps and also gets a burning sensation in his chest after walking that distance. He is still smoking and never started Chantix (did not realize he could get it free at [facility]).  He is not using any drugs now.   Weight is up 4 lbs since last appointment.   Labs (5/11): K 4.4, creatinine 1.23, LDL 104, HDL 33 Labs (6/11): BNP 97, K 4.8, creatinine 1.4  Current Medications (verified): 1)  Aspir-Low 81 Mg Tbec (Aspirin) .... Take One Tablet By Mouth Daily-Pt Not Taking.  He Has Been Out For A Week 2)  Coreg 6.25 Mg Tabs (Carvedilol) .... One Twice A Day 3)  Multivitamins  Tabs (Multiple Vitamin) .... Take One Tablet By Mouth Daily 4)  Spironolactone 25 Mg Tabs (Spironolactone) .... One -Half Tablet Daily-Last Taken 2 Days Ago. Pt Out 5)  Potassium Chloride Cr 10 Meq Cr-Tabs (Potassium Chloride) .... One Tablet By Mouth Daily-Pt Has Not Taken in The Last Week 6)  Omeprazole 20 Mg Cpdr (Omeprazole) .... 2 Capsule By Mouth  Daily-Pt Out 7)  Acetaminophen 325 Mg  Tabs (Acetaminophen) .... As Needed-Pt Taking 3 Tablets At A Time and Has Been Taking Every Day 8)  Proventil Hfa 108 (90 Base) Mcg/act Aers (Albuterol Sulfate) .... As Needed 9)  Ambien 5 Mg Tabs (Zolpidem Tartrate) .... One At Bedtime As Needed For Sleep 10)  Chantix Starting Month Pak 0.5 Mg X 11 & 1 Mg X 42 Tabs (Varenicline Tartrate) .... Take As Directed 11)  Chantix Continuing Month Pak 1 Mg Tabs (Varenicline Tartrate) .... Take As Directed 12)  Hydrocodone-Acetaminophen 5-325 Mg Tabs (Hydrocodone-Acetaminophen) .... Take One Tablet Every 6 Hrs As Needed For Pain.-Pt Has Not Taken in The Last Week 13)  Meclizine Hcl 25 Mg Tabs (Meclizine Hcl) .... Take One Tablet As Needed For Dizziness  Allergies (verified): 1)  ! 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  Prior heavy ETOH but states he quit after 5/11 hospitalization.  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   Past Medical History: Reviewed history from 06/08/2009 and no changes required. 1. Nonischemic Cardiomyopathy: Admitted 5/11 with acute systolic CHF.  LHC showed no angiographic CAD.  Echo (5/11) showed EF 15-20% with diffuse severe hypokinesis, moderate MR.  RHC showed PCWP 10, CI 1.47 (Fick).  Cause of  CMP thought to be heavy ETOH and cocaine.  2. ETOH abuse: Quit drinking after 5/11 hospitalization 3. Cocaine abuse: Quit in 2011 4. Active smoker: 1 ppd 5. Obesity  Family History: Reviewed history from 06/08/2009 and no changes required. "Heart disease" in mother  Social History: Reviewed history from 06/08/2009 and no changes required. Works as Gaffer.  Smokes 1 ppd.  Prior heavy ETOH but states he quit after 5/11 hospitalization.  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    Review of Systems       All systems reviewed and negative except as per HPI.   Vital Signs:  Patient profile:   55 year old male Height:      68 inches Weight:      226 pounds BMI:     34.49 Pulse rate:   90 / minute Pulse rhythm:   regular BP sitting:   129 / 90  (left arm) Cuff size:   large  Vitals Entered By: Judithe Modest CMA (July 08, 2009 8:26 AM)  Physical Exam  General:  Well developed, well nourished, in no acute distress. Obese.  Neck:  Neck thick, no JVD. No masses, thyromegaly or abnormal cervical nodes. Lungs:  Clear bilaterally to auscultation and percussion. Heart:  Non-displaced PMI, chest non-tender; regular rate and rhythm, S1, S2. +S4. 1/6 HSM apex.  Carotid upstroke normal, no bruit.  Pedals normal pulses. No edema, no varicosities. Abdomen:  Bowel sounds positive; abdomen soft and non-tender without masses, organomegaly, or hernias noted. No hepatosplenomegaly. Extremities:  No clubbing or cyanosis. Neurologic:  Alert and oriented x 3. Psych:  Normal affect.   Impression & Recommendations:  Problem # 1:  CONGESTIVE HEART FAILURE, ACUTE, SYSTOLIC (ICD-428.21) Patient is not taking his medications as ordered.  He is unsure what he is supposed to be on.  He has NYHA class III symptoms but is not significantly volume overloaded on exam.   - Restart Lasix at 20 mg daily, hold off on KCl for now (last K=4.8 and he was not taking KCl).  - No more hydralazine/isordil due to limiting headaches on isordil - Start lisinopril 2.5 mg two times a day.  - Continue Coreg and spironolactone.  - Echo in 9/11 on meds.  If EF<35%, will need ICD.  - BMET/BNP in 2 wks, f/u 1 month.   Problem # 2:  TOBACCO ABUSE (ICD-305.1) Needs to quit smoking.  He will pick up Chantix.    Other Orders: Echocardiogram (Echo)  Patient Instructions: 1)  Your physician has recommended you make the following change in your medication:  2)  Take Aspirin 81mg  daily--this should be  buffered or coated 3)  Take Lasix(furosemide) 20mg  daily--you can take one-half of a 40mg  tablet-when you get the new prescription it will be one 20mg  tablet daily 4)  Stop potassium(KCL) 5)  Take Spironolactone 12.5mg  daily--this will be one-half of a 25mg  tablet daily 6)  Start Lisinopril 2.5mg  twice a day--this is  a new medication 7)  Your physician recommends that you return for lab work in: 2 weeks--BMP/BNP 428.22 8)  Your physician recommends that you schedule a follow-up appointment in: 1 month with Dr Shirlee Latch. 9)  Your physician has requested that you have an echocardiogram.  Echocardiography is a painless test that uses sound waves to create images of your heart. It provides your doctor with information about the size and shape of your heart and how well your heart's chambers and valves are working.  This procedure takes approximately one hour.  There are no restrictions for this procedure. SEPTEMBER 2011 Prescriptions: COREG 6.25 MG TABS (CARVEDILOL) one twice a day  #60 x 11   Entered by:   Katina Dung, RN, BSN   Authorized by:   [name], MD   Signed by:   Katina Dung, RN, BSN on 07/08/2009   Method used:   Faxed to ...       [facility] (retail)       [address] [place], [place]  [address]       Ph: [phone]       Fax: [phone]   RxID:   [id number] FUROSEMIDE 20 MG TABS (FUROSEMIDE) one tablet daily  #30 x 11   Entered by:   Katina Dung, RN, BSN   Authorized by:   [name], MD   Signed by:   Katina Dung, RN, BSN on 07/08/2009   Method used:   Faxed to ...       [facility] (retail)       [address] [place], [place]  [address]       Ph: [phone]       Fax: [phone]   RxID:   [id number] LISINOPRIL 2.5 MG TABS (LISINOPRIL) one tablet twice a day  #60 x 11   Entered by:   Katina Dung, RN, BSN   Authorized by:   [name], MD    Signed by:   Katina Dung, RN, BSN on 07/08/2009   Method used:   Faxed to ...       [facility] (retail)       [address] [place], [place]  [address]       Ph: [phone]       Fax: [phone]   RxID:   [id number] SPIRONOLACTONE 25 MG TABS (SPIRONOLACTONE) one -half tablet daily  #30 x 11   Entered by:   Katina Dung, RN, BSN   Authorized by:   [name], MD   Signed by:   Katina Dung, RN, BSN on 07/08/2009   Method used:   Faxed to ...       [facility] (retail)       [address] [place], [place]  [address]       Ph: [phone]       Fax: [phone]   RxID:   [id number]

## 2010-02-08 NOTE — Assessment & Plan Note (Signed)
Summary: eph   Visit Type:  Follow-up Primary Provider:  [name]   History of Present Illness: 55 yo with history of nonischemic cardiomyopathy and recent admission with acute systolic CHF presents to establish cardiology care.  Ethan Wilcox a prolonged admission in 5/11 for CHF.  Echo showed EF 15-20% with diffuse hypokinesis.  Left and right heart cath showed no angiographic CAD and Fick CI 1.47.  Patient became hypotensive with ARF (cardiogenic shock) requiring milrinone and dopamine.  He was gradually titrated off these medications and begun on a by mouth regimen.  He is now at home and doing reasonably well.  He is short of breath after walking about 1/4 mile or climbing a flight of steps, which is an improvement compared to prior to hospitalization.  He is trying to avoid salt.  He is not taking Imdur because it gave him a headache, even at 30 mg daily.  He is not drinking at all now and has not used cocaine for several months.   He is still smoking.  He is having a lot of trouble falling asleep but denies orthopnea or PND.  No chest pain.  No lightheadedness or syncope.    ECG: sinus tachy at 102, right atrial enlargement  Labs (5/11): K 4.4, creatinine 1.23, LDL 104, HDL 33  Current Medications (verified): 1)  Aspir-Low 81 Mg Tbec (Aspirin) .... Take One Tablet By Mouth Daily 2)  Carvedilol 3.125 Mg Tabs (Carvedilol) .... Take One Tablet By Mouth Twice A Day 3)  Hydralazine Hcl 10 Mg Tabs (Hydralazine Hcl) .... Take One Tablet By Mouth Three Times A Day 4)  Isosorbide Mononitrate Cr 60 Mg Xr24h-Tab (Isosorbide Mononitrate) .... Stop 5)  Lasix 40 Mg Tabs (Furosemide) .... Take One Tablet By Mouth Daily 6)  Multivitamins  Tabs (Multiple Vitamin) .... Take One Tablet By Mouth Daily 7)  Spironolactone 25 Mg Tabs (Spironolactone) .... Out 8)  Potassium Chloride Cr 10 Meq Cr-Tabs (Potassium Chloride) .... One Tablet By Mouth Daily 9)  Omeprazole 20 Mg Cpdr (Omeprazole) .... 2 Capsule By  Mouth Daily 10)  Acetaminophen 325 Mg  Tabs (Acetaminophen) .... As Needed 11)  Proventil Hfa 108 (90 Base) Mcg/act Aers (Albuterol Sulfate) .... As Needed  Allergies (verified): 1)  ! Penicillin  Past History:  Past Medical History: 1. Nonischemic Cardiomyopathy: Admitted 5/11 with acute systolic CHF.  LHC showed no angiographic CAD.  Echo (5/11) showed EF 15-20% with diffuse severe hypokinesis, moderate MR.  RHC showed PCWP 10, CI 1.47 (Fick).  Cause of CMP thought to be heavy ETOH and cocaine.  2. ETOH abuse: Quit drinking after 5/11 hospitalization 3. Cocaine abuse: Quit in 2011 4. Active smoker: 1 ppd 5. Obesity  Family History: "Heart disease" in mother  Social History: Works as Gaffer.  Smokes 1 ppd.  Prior heavy ETOH but states he quit after 5/11 hospitalization.  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   Review of Systems       All systems reviewed and negative except as per HPI.   Vital Signs:  Patient profile:   55 year old male Height:      68 inches Weight:      222 pounds BMI:     33.88 Resp:     18 per minute BP sitting:   122 / 86  Vitals Entered By: Marrion Coy, CNA (Jun 08, 2009 8:00 AM)  Physical Exam  General:  Well developed, well nourished, in no acute distress. Obese.  Head:  normocephalic  and atraumatic Nose:  no deformity, discharge, inflammation, or lesions Mouth:  Teeth, gums and palate normal. Oral mucosa normal. Neck:  Neck thick, no JVD. No masses, thyromegaly or abnormal cervical nodes. Lungs:  Clear bilaterally to auscultation and percussion. Heart:  Non-displaced PMI, chest non-tender; regular rate and rhythm, S1, S2 without rubs or gallops. 1/6 HSM apex.  Carotid upstroke normal, no bruit.  Pedals normal pulses. No edema, no varicosities. Abdomen:  Bowel sounds positive; abdomen soft and non-tender without masses, organomegaly, or hernias noted. No hepatosplenomegaly. Msk:  Back normal, normal gait. Muscle strength and tone  normal. Extremities:  No clubbing or cyanosis. Neurologic:  Alert and oriented x 3. Skin:  Intact without lesions or rashes. Psych:  Normal affect.   Impression & Recommendations:  Problem # 1:  CARDIOMYOPATHY (ICD-425.4) Nonischemic cardiomyopathy, probably due to ETOH and cocaine.  Patient does not appear significantly volume overloaded today.  He has NYHA II-III symptoms, improved compared to prior to last hospitalization.   - Continue Lasix 40 mg daily and KCl 10 mEq daily.  Check BMET/BNP today.  - Continue hydralazine 10 mg three times a day and add isordil 10 mg three times a day to replace Imdur.  If he is unable to tolerate isordil either, I may stop both hydralazine and isordil.   - Add low dose ACEI: lisinopril 2.5 mg daily.  - Increase Coreg to 6.25 mg two times a day.  - Refill spironolactone.  - BMET in 2 wks - If EF remains low on medical therapy and he stays abstinent from cocaine and ETOH, will need ICD.   Problem # 2:  ALCOHOL ABUSE (ICD-305.00) Needs to continue to avoid ETOH completely.   Problem # 3:  TOBACCO ABUSE (ICD-305.1) I encouraged him to stop completely.  I will give him a prescription for Chantix which he can get filled at [facility].   I will give him Ambien to use as needed for insomnia.  I will see him back in 1 month.   Other Orders: TLB-BNP (B-Natriuretic Peptide) (83880-BNPR) TLB-BMP (Basic Metabolic Panel-BMET) (80048-METABOL)  Patient Instructions: 1)  Your physician has recommended you make the following change in your medication:  2)  Increase Coreg(cravedilol) to 6.25mg  twice a day-you can take two 3.125mg  tablets twice a day 3)  Start Lisinopril 2.5mg  daily 4)  Start Isordil 10mg  three times a day--this medication works with Hydralazine 5)  Continue Spironolactone 12.5 mg daily--this will be one-half of a 25mg  tablet 6)  Use Chantix to help you stop smoking.  7)  Use Ambien 5mg  as needed to help you sleep-you have the  prescription. 8)  Your physician recommends that you have lab today AND  in 2 weeks--BMP/BNP  425.4 9)  Your physician recommends that you schedule a follow-up appointment in: 78month with Dr Shirlee Latch. Prescriptions: CHANTIX CONTINUING MONTH PAK 1 MG TABS (VARENICLINE TARTRATE) take as directed  #1 x 1   Entered by:   Katina Dung, RN, BSN   Authorized by:   [name], MD   Signed by:   Katina Dung, RN, BSN on 06/08/2009   Method used:   Faxed to ...       [facility] (retail)       [address] [place], [place]  [address]       Ph: [phone]       Fax: [phone]   RxID:   [id number] CHANTIX STARTING MONTH PAK 0.5  MG X 11 & 1 MG X 42 TABS (VARENICLINE TARTRATE) take as directed  #1 x 0   Entered by:   Katina Dung, RN, BSN   Authorized by:   [name], MD   Signed by:   Katina Dung, RN, BSN on 06/08/2009   Method used:   Faxed to ...       [facility] - [facility] (retail)       [address] [place], [place]  [address]       Ph: [phone]       Fax: [phone]   RxID:   [id number] SPIRONOLACTONE 25 MG TABS (SPIRONOLACTONE) one -half tablet daily  #15 x 6   Entered by:   Katina Dung, RN, BSN   Authorized by:   [name], MD   Signed by:   Katina Dung, RN, BSN on 06/08/2009   Method used:   Faxed to ...       [facility] - [facility] (retail)       [address] [place], [place]  [address]       Ph: [phone]       Fax: [phone]   RxID:   [id number] ISOSORBIDE DINITRATE 10 MG TABS (ISOSORBIDE DINITRATE) one tablet three times a day  #90 x 6   Entered by:   Katina Dung, RN, BSN   Authorized by:   [name], MD   Signed by:   Katina Dung, RN, BSN on 06/08/2009   Method used:   Faxed to ...       [facility] - [facility] (retail)       [address] [place], [place]  [address]        Ph: [phone]       Fax: [phone]   RxID:   [id number] COREG 6.25 MG TABS (CARVEDILOL) one twice a day  #60 x 6   Entered by:   Katina Dung, RN, BSN   Authorized by:   [name], MD   Signed by:   Katina Dung, RN, BSN on 06/08/2009   Method used:   Faxed to ...       [facility] - Pharmac (retail)       [address] [place], [place]  [address]       Ph: [phone]       Fax: [phone]   RxID:   [id number]

## 2010-02-08 NOTE — Progress Notes (Signed)
Summary: Questions about medication  Phone Note Call from Patient Call back at Home Phone [phone]   Caller: Patient Complaint: Breathing Problems Summary of Call: Pt have question about medications Carvedilol 6.25mg , Isosorbide 20mg , Spironolactone 25mg , Furosemide 40mg  Initial call taken by: Judie Grieve,  June 10, 2009 11:13 AM  Follow-up for Phone Call        talked with pt by telephone--he states he took his first Isordil yesterday about the middle of the afternoon and he had a headache starting about 1 hour after he took the isordil that lasted until about 6AM this morning--he will not take anymore Isordil but will continue all his other medications-I will review with Dr Shirlee Latch. [name] --reviewed with Dr McLean--pt to remain off Isordil and continue all other medications--I discussed with pt by telephone

## 2010-02-08 NOTE — Progress Notes (Signed)
Summary: authorization for Ambien  Phone Note Outgoing Call   Call placed by: Katina Dung, RN, BSN,  October 01, 2009 2:38 PM Call placed to: pharmacy Summary of Call: Ambien for sleep  Follow-up for Phone Call        I received request for prior authorization for Ambien 10mg  -I talked with Aram Beecham at [phone] states pt could get Zolpidem 5mg  and it would not require prior authorization--I reviewed with Dr Shirlee Latch and he ordered Zolpidem 5mg  hs as needed sleep instead of Ambien 10mg  hs prn sleep [name] --I talked with Lilla at [facility] --pt can have Zolpidem 5mg  #30 as needed sleep with no refills--pt to get any future refills from his PCP [name]     New/Updated Medications: ZOLPIDEM TARTRATE 5 MG TABS (ZOLPIDEM TARTRATE) one at bedtime as needed for sleep Prescriptions: ZOLPIDEM TARTRATE 5 MG TABS (ZOLPIDEM TARTRATE) one at bedtime as needed for sleep  #30 x 0   Entered and Authorized by:   Katina Dung, RN, BSN   Signed by:   Katina Dung, RN, BSN on 10/01/2009   Method used:   Print then Give to Patient   RxID:   [id number]

## 2010-02-10 NOTE — Letter (Signed)
Summary: Appointment - Missed  [facility], Main Office  [address]   [place], [place] [address]   Phone: [phone]  Fax: [phone]     January 17, 2010 MRN: [id number]   [facility] [address] [place], [place]  [address]   Dear Mr. Falter,  Our records indicate you missed your appointment on 01-07-10 with Dr.  Ladona Ridgel.                                    It is very important that we reach you to reschedule this appointment. We look forward to participating in your health care needs. Please contact us at the number listed above at your earliest convenience to reschedule this appointment.     Sincerely,    Glass blower/designer

## 2010-02-10 NOTE — Assessment & Plan Note (Signed)
Summary: add on cramping in chest per Ethan Wilcox in echo/jr   Visit Type:  add on  Primary Provider:  Lehman Wilcox  CC:  pt was added on today from echo due to crampin in his RUQ and around his back...denies any sob or edema.  History of Present Illness: Primary Cardiologist:  Dr. Marca Wilcox   55 yo with a history of nonischemic cardiomyopathy with EF 15-20%.  Left and right heart cath in May 2011 showed no angiographic CAD.  He was seen on 12/09/2009 for f/u.  His spironolactone was increased, potassium was d/c'd, lisinopril was increased and he was set up for repeat echo to reassess his LVF.  He was having his echo today and noted chest pain to the echo tech.  He was added on to my schedule for evaluation.  BMP and BNP is pending today.  He notes periods of what he calls cramping in his chest over the last several days.  He notes that on the right side and the left side.  He has a history of chest pressure as well.  He denies exertional symptoms.  His exertional shortness of breath is unchanged.  He describes New York Heart Association class II symptoms.  He has added one extra pillow to sleep on at night recently.  He denies paroxysmal nocturnal dyspnea.  He denies increasing lower extremity edema.  He does not weigh himself at home.  He denies syncope or palpitations.  He does describe significant belching and indigestion.  TUMS and belching make his cramping feel better.  Changing position makes his symptoms better.  He questions whether or not stopping his potassium may be playing a role as well.  Of note, he has been drinking alcohol about 3 times a week.  He states he drinks about a half a bottle of vodka (1/2 pint bottle).  He also drinks about a half a beer.  He denies drug abuse.  He still smokes cigarettes.  He tells me that when he has refrained from alcohol his chest symptoms typically subside.    Cardiovascular Risk History:      Positive major cardiovascular risk factors include  male age 68 years old or older, hypertension, and current tobacco user.        Positive history for target organ damage include cardiac end organ damage (either CHF or LVH).    Current Medications (verified): 1)  Aspir-Low 81 Mg Tbec (Aspirin) .... Take One Tablet By Mouth Daily 2)  Coreg 12.5 Mg Tabs (Carvedilol) .... One Twice A Day 3)  Multivitamins  Tabs (Multiple Vitamin) .... Take One Tablet By Mouth Daily 4)  Spironolactone 25 Mg Tabs (Spironolactone) .... One  Tablet Daily 5)  Acetaminophen 325 Mg  Tabs (Acetaminophen) .... As Needed-Pt Taking 3 Tablets At A Time and Has Been Taking Every Day 6)  Proventil Hfa 108 (90 Base) Mcg/act Aers (Albuterol Sulfate) .... As Needed 7)  Ambien 5 Mg Tabs (Zolpidem Tartrate) .... One At Bedtime As Needed For Sleep 8)  Chantix Starting Month Pak 0.5 Mg X 11 & 1 Mg X 42 Tabs (Varenicline Tartrate) .... Take As Directed 9)  Chantix Continuing Month Pak 1 Mg Tabs (Varenicline Tartrate) .... Take As Directed 10)  Meclizine Hcl 25 Mg Tabs (Meclizine Hcl) .... Take One Tablet As Needed For Dizziness 11)  Lasix 40 Mg Tabs (Furosemide) .... Once Daily 12)  Lisinopril 5 Mg Tabs (Lisinopril) .... One Twice A Day 13)  Ibuprofen 800 Mg Tabs (Ibuprofen) .... Three  Times A Day, As Needed 14)  Qc Stool Softener 100 Mg Caps (Docusate Sodium) .... Two Times A Day, As Needed 15)  Omeprazole 20 Mg Cpdr (Omeprazole) .[name].. 1 Cap Two Times A Day  Allergies: 1)  ! Penicillin  Past History:  Past Medical History: Reviewed history from 06/08/2009 and no changes required. 1. Nonischemic Cardiomyopathy: Admitted 5/11 with acute systolic CHF.  LHC showed no angiographic CAD.  Echo (5/11) showed EF 15-20% with diffuse severe hypokinesis, moderate MR.  RHC showed PCWP 10, CI 1.47 (Fick).  Cause of CMP thought to be heavy ETOH and cocaine.  2. ETOH abuse: Quit drinking after 5/11 hospitalization 3. Cocaine abuse: Quit in 2011 4. Active smoker: 1 ppd 5. Obesity  Past  Surgical History: Reviewed history from 07/07/2009 and no changes required. 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  Prior heavy ETOH but states he quit after 5/11 hospitalization.  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   Vital Signs:  Patient profile:   55 year old male Height:      68 inches Weight:      230.50 pounds BMI:     35.17 Pulse rate:   82 / minute Pulse rhythm:   regular BP sitting:   102 / 84  (left arm) Cuff size:   regular  Vitals Entered By: Danielle Rankin, CMA (December 21, 2009 12:34 PM)  Physical Exam  General:  Well nourished, well developed, in no acute distress HEENT: normal Neck: no JVD Cardiac:  normal S1, S2; RRR; no murmur; no gallop Lungs:  clear to auscultation bilaterally, no wheezing, rhonchi or rales Abd: soft, nontender, no hepatomegaly Ext: no edema Vascular: no carotid  bruits Skin: warm and dry Neuro:  CNs 2-12 intact, no focal abnormalities noted    EKG  Procedure date:  12/21/2009  Findings:      Normal sinus rhythm with rate of:  82 normal axis nonspecific ST-TW changes QTc 469 ms no significant change since 12/09/2009   Impression & Recommendations:  Problem # 1:  CHEST PAIN UNSPECIFIED (ICD-786.50)  He seems to be describing symptoms of gastritis and atypical chest pain.  I suspect his gastritis is related to his continued alcohol use.  We had a long discussion about this.  I have asked him to return to his primary care provider to discuss help with complete abstinence from alcohol.  He had normal coronary arteries when he was admitted for congestive heart failure earlier this year.  He has not had any symptoms suggestive of angina.  His electrocardiogram is normal.  His symptoms are  improved by using TUMS and by belching.  He denies pleuritic symptoms.  He denies any tearing or ripping sensation in his chest.  I have placed him on  Pepcid 20 mg twice a day.  He should follow up with his primary care provider.  He has been instructed to follow up with [place] if he has continued symptoms.  Problem # 2:  CHRONIC SYSTOLIC HEART FAILURE (ICD-428.22)  He appears to be optivolemic.  He has lab work pending from today.  I do not believe the volume overload is playing a role in his chest symptoms.  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 He is on a good medical regimen seems to be tolerating this. He had a followup echocardiogram today. As noted above, he continues to drink alcohol.  I explained to him that his key to recovery his medical compliance and cessation from alcohol use.  Problem # 4:  ALCOHOL ABUSE (ICD-305.00) I have asked him to call and arrange followup with his primary care provider.  He would likely benefit from being referred to the mental health counselor located in her office to help with alcohol cessation.  As noted, I suspect he has developed some gastritis from alcohol use.  He has been placed on an H2 receptor blocker and asked to followup with his primary care provider.  Problem # 5:  HYPERTENSION, BENIGN ESSENTIAL (ICD-401.1)  Controlled  Problem # 6:  TOBACCO ABUSE (ICD-305.1) We discussed cessation.  Cardiovascular Risk Assessment/Plan:      The patient's hypertensive risk group is category C: Target organ damage and/or diabetes.  His calculated 10 year risk of coronary heart disease is 18 %.  Today's blood pressure is 102/84.     Patient Instructions: 1)  Call [facility] at [phone] to make an appointment to see your primary provider about your stomach and to discuss seeing their counselor. 2)  Your physician has recommended you make the following change in your medication:  start taking Pepcid 20 mg two times a day for your stomach. 3)  Your physician recommends that you schedule a follow-up appointment in: Keep follow up with Dr.  Shirlee Latch in 3 months.  Please call for sooner appointment if your chest symptoms do not improve with the pepcid. Prescriptions: PEPCID 20 MG TABS (FAMOTIDINE) Take 1 tablet by mouth two times a day for stomach  #60 x 2   Entered and Authorized by:   Tereso Newcomer PA-C   Signed by:   Tereso Newcomer PA-C on 12/21/2009   Method used:   Electronically to        [facility]. [address] [phone]* (retail)       [address]       [place], [place]  [address]       Ph: [phone] or [phone]       Fax: [phone]   RxID:   [id number]

## 2010-02-11 NOTE — Letter (Signed)
Summary: FAXED REQUESTED RECORDS TO [facility]  FAXED REQUESTED RECORDS TO [facility]   Imported By: Arta Bruce 06/25/2009 10:32:06  _____________________________________________________________________  External Attachment:    Type:   Image     Comment:   External Document

## 2010-03-09 ENCOUNTER — Institutional Professional Consult (permissible substitution) (INDEPENDENT_AMBULATORY_CARE_PROVIDER_SITE_OTHER): Payer: Medicaid Other | Admitting: Internal Medicine

## 2010-03-09 ENCOUNTER — Encounter: Payer: Self-pay | Admitting: Internal Medicine

## 2010-03-09 DIAGNOSIS — I10 Essential (primary) hypertension: Secondary | ICD-10-CM

## 2010-03-09 DIAGNOSIS — I5022 Chronic systolic (congestive) heart failure: Secondary | ICD-10-CM

## 2010-03-10 ENCOUNTER — Ambulatory Visit: Payer: Medicaid Other | Admitting: Cardiology

## 2010-03-15 ENCOUNTER — Ambulatory Visit: Payer: Self-pay | Admitting: Cardiology

## 2010-03-17 NOTE — Assessment & Plan Note (Signed)
Summary: to discuss ICD/ ef 25-30 %/saf/wpa   Primary Provider:  [name]   History of Present Illness: Ethan Wilcox is referred today by Dr. Shirlee Latch for consideration for prophylactic ICD implant. The patient is a pleasant 55 yo man with a h/o polysubstance abuse (ETOH and cocaine) who has had a non-ischemic CM and CHF.  He has chronic class 2-3 symptoms. He states that he stopped using cocaine several months ago but is still drinking ETOH, approximately a fifth of wiskey a week. He denies syncope. He has  occaisional periods of chest discomfort.  He denies peripheral edema.  Current Medications (verified): 1)  Aspir-Low 81 Mg Tbec (Aspirin) .... Take One Tablet By Mouth Daily 2)  Coreg 12.5 Mg Tabs (Carvedilol) .... One Twice A Day 3)  Multivitamins  Tabs (Multiple Vitamin) .... Take One Tablet By Mouth Daily 4)  Spironolactone 25 Mg Tabs (Spironolactone) .... One  Tablet Daily 5)  Proventil Hfa 108 (90 Base) Mcg/act Aers (Albuterol Sulfate) .... As Needed 6)  Zolpidem Tartrate 10 Mg Tabs (Zolpidem Tartrate) .... As Needed 7)  Meclizine Hcl 25 Mg Tabs (Meclizine Hcl) .... Take One Tablet As Needed For Dizziness 8)  Lasix 40 Mg Tabs (Furosemide) .... Once Daily 9)  Lisinopril 5 Mg Tabs (Lisinopril) .... One Twice A Day 10)  Qc Stool Softener 100 Mg Caps (Docusate Sodium) .... Two Times A Day, As Needed 11)  Omeprazole 20 Mg Cpdr (Omeprazole) .[name].. 1 Cap Two Times A Day/ Out 12)  Pepcid 20 Mg Tabs (Famotidine) .... Take 1 Tablet By Mouth Two Times A Day For Stomach/ Out 13)  Potassium Chloride Cr 10 Meq Cr-Caps (Potassium Chloride) .... Take One Tablet By Mouth Daily 14)  Tums 500 Mg Chew (Calcium Carbonate Antacid) .... Uad  Allergies: 1)  ! Penicillin  Past History:  Past Medical History: Last updated: 06/08/2009 1. Nonischemic Cardiomyopathy: Admitted 5/11 with acute systolic CHF.  LHC showed no angiographic CAD.  Echo (5/11) showed EF 15-20% with diffuse severe hypokinesis,  moderate MR.  RHC showed PCWP 10, CI 1.47 (Fick).  Cause of CMP thought to be heavy ETOH and cocaine.  2. ETOH abuse: Quit drinking after 5/11 hospitalization 3. Cocaine abuse: Quit in 2011 4. Active smoker: 1 ppd 5. 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  Prior heavy ETOH but states he quit after 5/11 hospitalization.  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   Review of Systems       all systems reviewed and negative except as noted in the HPI.  Vital Signs:  Patient profile:   55 year old male Height:      68 inches Weight:      242 pounds BMI:     36.93 Pulse rate:   60 / minute BP sitting:   120 / 80  (left arm)  Vitals Entered By: Laurance Flatten CMA (March 09, 2010 11:52 AM)  Physical Exam  General:  Well nourished, well developed, in no acute distress HEENT: normal Neck: no JVD Cardiac:  normal S1, S2; RRR; no murmur; no gallop Lungs:  clear to auscultation bilaterally, no wheezing, rhonchi or rales Abd: soft, nontender, no hepatomegaly Ext: no edema Vascular: no carotid  bruits Skin: warm and dry Neuro:  CNs 2-12 intact, no focal abnormalities noted    Impression & Recommendations:  Problem # 1:  ACUTE ON CHRONIC SYSTOLIC HEART FAILURE (ICD-428.23) His current symptoms are class 2. I have asked him to continue his current meds, maintain a low sodium diet and stop drinking alcohol completely. His ongoing ETOH abuse makes ICD Implant contraindicated. His updated medication list for this problem includes:    Aspir-low 81 Mg Tbec (Aspirin) .[name]... Take one tablet by mouth daily    Coreg 12.5 Mg Tabs (Carvedilol) ..... One twice a day    Spironolactone 25 Mg Tabs (Spironolactone) ..... One  tablet daily    Lasix 40 Mg Tabs (Furosemide) ..... Once daily    Lisinopril 5 Mg Tabs  (Lisinopril) ..... One twice a day  Problem # 2:  ALCOHOL ABUSE (ICD-305.00) He is unfortunately continuing to abuse ETOH. I have asked him to stop drinking altogether.  Problem # 3:  HYPERTENSION, BENIGN ESSENTIAL (ICD-401.1) His blood pressure is well controlled today. Will follow and maintain a low sodium diet. His updated medication list for this problem includes:    Aspir-low 81 Mg Tbec (Aspirin) .[name]... Take one tablet by mouth daily    Coreg 12.5 Mg Tabs (Carvedilol) ..... One twice a day    Spironolactone 25 Mg Tabs (Spironolactone) ..... One  tablet daily    Lasix 40 Mg Tabs (Furosemide) ..... Once daily    Lisinopril 5 Mg Tabs (Lisinopril) ..... One twice a day  Patient Instructions: 1)  Your physician recommends that you schedule a follow-up appointment in: AS NEEDED 2)  Your physician recommends that you continue on your current medications as directed. Please refer to the Current Medication list given to you today.NICOTINE PATCH AS DIRECTED 3)  Your physician discussed the hazards of tobacco use.  Tobacco use cessation is recommended and techniques and options to help you quit were discussed. 4)  STOP ALL ALCOHOL INTAKE Prescriptions: NICOTINE 14 MG/24HR PT24 (NICOTINE) APPLY 1 DAILY  #30 x 3   Entered by:   Scherrie Bateman, LPN   Authorized by:   Laren Boom, MD, [facility]   Signed by:   Scherrie Bateman, LPN on 04/54/0981   Method used:   Electronically to        [facility] [address] [phone]* (retail)       [address]       [place], [place]  [address]       Ph: [phone] or [phone]       Fax: [phone]   RxID:   [id number] OMEPRAZOLE 20 MG CPDR (OMEPRAZOLE) 1 cap two times a day/ out  #30 x 6   Entered by:   Scherrie Bateman, LPN   Authorized by:   Laren Boom, MD, [facility]   Signed by:   Scherrie Bateman, LPN on 38/75/6433   Method used:   Electronically to        [facility] [address] [phone]* (retail)       [address]        [place], [place]  [address]       Ph: [phone] or [phone]       Fax: [phone]   RxID:   [id number]

## 2010-03-23 ENCOUNTER — Encounter: Payer: Self-pay | Admitting: Cardiology

## 2010-03-23 ENCOUNTER — Ambulatory Visit (INDEPENDENT_AMBULATORY_CARE_PROVIDER_SITE_OTHER): Payer: Medicaid Other | Admitting: Cardiology

## 2010-03-23 ENCOUNTER — Other Ambulatory Visit: Payer: Self-pay | Admitting: Cardiology

## 2010-03-23 DIAGNOSIS — I5021 Acute systolic (congestive) heart failure: Secondary | ICD-10-CM

## 2010-03-23 DIAGNOSIS — I5022 Chronic systolic (congestive) heart failure: Secondary | ICD-10-CM

## 2010-03-23 LAB — BASIC METABOLIC PANEL
Calcium: 9.6 mg/dL (ref 8.4–10.5)
GFR: 81.34 mL/min (ref >60.00)
Glucose, Bld: 97 mg/dL (ref 70–99)
Potassium: 4.3 mEq/L (ref 3.5–5.1)
Sodium: 137 mEq/L (ref 135–145)

## 2010-03-23 LAB — POCT I-STAT, CHEM 8
BUN: 22 mg/dL (ref 6–23)
Chloride: 105 mEq/L (ref 96–112)
Creatinine, Ser: 1.2 mg/dL (ref 0.4–1.5)
Potassium: 4.3 mEq/L (ref 3.5–5.1)
Sodium: 139 mEq/L (ref 135–145)

## 2010-03-23 LAB — RAPID URINE DRUG SCREEN, HOSP PERFORMED
Amphetamines: NOT DETECTED
Barbiturates: NOT DETECTED
Opiates: NOT DETECTED

## 2010-03-23 LAB — CBC
HCT: 43.5 % (ref 39.0–52.0)
Hemoglobin: 14.8 g/dL (ref 13.0–17.0)
MCHC: 34 g/dL (ref 30.0–36.0)
RBC: 4.87 MIL/uL (ref 4.22–5.81)
WBC: 7.5 10*3/uL (ref 4.0–10.5)

## 2010-03-23 LAB — POCT CARDIAC MARKERS
CKMB, poc: 1.1 ng/mL (ref 1.0–8.0)
Myoglobin, poc: 52.8 ng/mL (ref 12–200)
Myoglobin, poc: 54.9 ng/mL (ref 12–200)
Troponin i, poc: <0.05 ng/mL (ref 0.00–0.09)

## 2010-03-23 LAB — BRAIN NATRIURETIC PEPTIDE: Pro B Natriuretic peptide (BNP): 93 pg/mL (ref 0.0–100.0)

## 2010-03-28 LAB — POCT I-STAT, CHEM 8
BUN: 27 mg/dL — ABNORMAL HIGH (ref 6–23)
Hemoglobin: 15.3 g/dL (ref 13.0–17.0)
Sodium: 137 mEq/L (ref 135–145)
TCO2: 24 mmol/L (ref 0–100)

## 2010-03-28 LAB — RAPID URINE DRUG SCREEN, HOSP PERFORMED
Benzodiazepines: NOT DETECTED
Cocaine: NOT DETECTED
Tetrahydrocannabinol: POSITIVE — AB

## 2010-03-29 ENCOUNTER — Encounter: Payer: Self-pay | Admitting: Cardiology

## 2010-03-29 ENCOUNTER — Telehealth: Payer: Self-pay | Admitting: *Deleted

## 2010-03-29 LAB — CBC
HCT: 35.7 % — ABNORMAL LOW (ref 39.0–52.0)
HCT: 37.5 % — ABNORMAL LOW (ref 39.0–52.0)
HCT: 38.3 % — ABNORMAL LOW (ref 39.0–52.0)
HCT: 38.7 % — ABNORMAL LOW (ref 39.0–52.0)
HCT: 40.9 % (ref 39.0–52.0)
HCT: 44.2 % (ref 39.0–52.0)
HCT: 45.3 % (ref 39.0–52.0)
Hemoglobin: 13.3 g/dL (ref 13.0–17.0)
Hemoglobin: 14.1 g/dL (ref 13.0–17.0)
MCHC: 34.1 g/dL (ref 30.0–36.0)
MCHC: 34.8 g/dL (ref 30.0–36.0)
MCHC: 34.8 g/dL (ref 30.0–36.0)
MCV: 92.7 fL (ref 78.0–100.0)
MCV: 92.9 fL (ref 78.0–100.0)
MCV: 93 fL (ref 78.0–100.0)
MCV: 93 fL (ref 78.0–100.0)
MCV: 93.5 fL (ref 78.0–100.0)
MCV: 94.2 fL (ref 78.0–100.0)
MCV: 94.3 fL (ref 78.0–100.0)
Platelets: 276 10*3/uL (ref 150–400)
Platelets: 285 10*3/uL (ref 150–400)
Platelets: 286 10*3/uL (ref 150–400)
Platelets: 308 10*3/uL (ref 150–400)
Platelets: 310 10*3/uL (ref 150–400)
Platelets: 342 10*3/uL (ref 150–400)
RBC: 4.04 MIL/uL — ABNORMAL LOW (ref 4.22–5.81)
RBC: 4.73 MIL/uL (ref 4.22–5.81)
RBC: 4.88 MIL/uL (ref 4.22–5.81)
RDW: 14 % (ref 11.5–15.5)
RDW: 14.1 % (ref 11.5–15.5)
RDW: 14.1 % (ref 11.5–15.5)
WBC: 12.1 10*3/uL — ABNORMAL HIGH (ref 4.0–10.5)
WBC: 7.1 10*3/uL (ref 4.0–10.5)
WBC: 8.4 10*3/uL (ref 4.0–10.5)
WBC: 8.6 10*3/uL (ref 4.0–10.5)
WBC: 9.1 10*3/uL (ref 4.0–10.5)
WBC: 9.8 10*3/uL (ref 4.0–10.5)

## 2010-03-29 LAB — CARBOXYHEMOGLOBIN
Carboxyhemoglobin: 1.2 % (ref 0.5–1.5)
Methemoglobin: 0.5 % (ref 0.0–1.5)
Methemoglobin: 0.7 % (ref 0.0–1.5)
O2 Saturation: 66.1 %
Total hemoglobin: 12.3 g/dL — ABNORMAL LOW (ref 13.5–18.0)
Total hemoglobin: 12.6 g/dL — ABNORMAL LOW (ref 13.5–18.0)

## 2010-03-29 LAB — POCT I-STAT 3, VENOUS BLOOD GAS (G3P V)
Acid-Base Excess: 1 mmol/L (ref 0.0–2.0)
Bicarbonate: 26.6 mEq/L — ABNORMAL HIGH (ref 20.0–24.0)
Bicarbonate: 27 mEq/L — ABNORMAL HIGH (ref 20.0–24.0)
O2 Saturation: 47 %
TCO2: 28 mmol/L (ref 0–100)
TCO2: 28 mmol/L (ref 0–100)
pCO2, Ven: 44.7 mmHg — ABNORMAL LOW (ref 45.0–50.0)
pCO2, Ven: 46.8 mmHg (ref 45.0–50.0)
pH, Ven: 7.369 — ABNORMAL HIGH (ref 7.250–7.300)
pH, Ven: 7.382 — ABNORMAL HIGH (ref 7.250–7.300)
pO2, Ven: 27 mmHg — CL (ref 30.0–45.0)

## 2010-03-29 LAB — BASIC METABOLIC PANEL
BUN: 12 mg/dL (ref 6–23)
BUN: 13 mg/dL (ref 6–23)
BUN: 14 mg/dL (ref 6–23)
BUN: 26 mg/dL — ABNORMAL HIGH (ref 6–23)
BUN: 27 mg/dL — ABNORMAL HIGH (ref 6–23)
CO2: 26 mEq/L (ref 19–32)
CO2: 26 mEq/L (ref 19–32)
CO2: 27 mEq/L (ref 19–32)
CO2: 29 mEq/L (ref 19–32)
Calcium: 9.4 mg/dL (ref 8.4–10.5)
Chloride: 100 mEq/L (ref 96–112)
Chloride: 101 mEq/L (ref 96–112)
Chloride: 101 mEq/L (ref 96–112)
Chloride: 103 mEq/L (ref 96–112)
Chloride: 105 mEq/L (ref 96–112)
Chloride: 110 mEq/L (ref 96–112)
Chloride: 98 mEq/L (ref 96–112)
Creatinine, Ser: 1.09 mg/dL (ref 0.4–1.5)
Creatinine, Ser: 1.35 mg/dL (ref 0.4–1.5)
Creatinine, Ser: 2.27 mg/dL — ABNORMAL HIGH (ref 0.4–1.5)
GFR calc Af Amer: >60 mL/min (ref >60)
GFR calc Af Amer: >60 mL/min (ref >60)
GFR calc Af Amer: >60 mL/min (ref >60)
GFR calc non Af Amer: >60 mL/min (ref >60)
GFR calc non Af Amer: >60 mL/min (ref >60)
GFR calc non Af Amer: >60 mL/min (ref >60)
Glucose, Bld: 101 mg/dL — ABNORMAL HIGH (ref 70–99)
Glucose, Bld: 112 mg/dL — ABNORMAL HIGH (ref 70–99)
Glucose, Bld: 124 mg/dL — ABNORMAL HIGH (ref 70–99)
Glucose, Bld: 162 mg/dL — ABNORMAL HIGH (ref 70–99)
Potassium: 3.9 mEq/L (ref 3.5–5.1)
Potassium: 3.9 mEq/L (ref 3.5–5.1)
Potassium: 3.9 mEq/L (ref 3.5–5.1)
Potassium: 4 mEq/L (ref 3.5–5.1)
Potassium: 4.1 mEq/L (ref 3.5–5.1)
Potassium: 4.2 mEq/L (ref 3.5–5.1)
Potassium: 4.3 mEq/L (ref 3.5–5.1)
Potassium: 4.4 mEq/L (ref 3.5–5.1)
Sodium: 135 mEq/L (ref 135–145)
Sodium: 137 mEq/L (ref 135–145)
Sodium: 138 mEq/L (ref 135–145)
Sodium: 142 mEq/L (ref 135–145)

## 2010-03-29 LAB — POCT CARDIAC MARKERS: Troponin i, poc: <0.05 ng/mL (ref 0.00–0.09)

## 2010-03-29 LAB — POCT I-STAT, CHEM 8
Calcium, Ion: 1.15 mmol/L (ref 1.12–1.32)
Glucose, Bld: 107 mg/dL — ABNORMAL HIGH (ref 70–99)
HCT: 42 % (ref 39.0–52.0)
Hemoglobin: 14.3 g/dL (ref 13.0–17.0)

## 2010-03-29 LAB — CARDIAC PANEL(CRET KIN+CKTOT+MB+TROPI)
CK, MB: 2.4 ng/mL (ref 0.3–4.0)
CK, MB: 2.4 ng/mL (ref 0.3–4.0)
CK, MB: 2.7 ng/mL (ref 0.3–4.0)
Relative Index: 1.8 (ref 0.0–2.5)
Troponin I: 0.11 ng/mL — ABNORMAL HIGH (ref 0.00–0.06)

## 2010-03-29 LAB — POCT I-STAT 3, ART BLOOD GAS (G3+)
Acid-Base Excess: 2 mmol/L (ref 0.0–2.0)
Bicarbonate: 24.8 mEq/L — ABNORMAL HIGH (ref 20.0–24.0)
O2 Saturation: 89 %
TCO2: 26 mmol/L (ref 0–100)
pH, Arterial: 7.474 — ABNORMAL HIGH (ref 7.350–7.450)
pO2, Arterial: 50 mmHg — ABNORMAL LOW (ref 80.0–100.0)

## 2010-03-29 LAB — COMPREHENSIVE METABOLIC PANEL
ALT: 34 U/L (ref 0–53)
AST: 22 U/L (ref 0–37)
AST: 25 U/L (ref 0–37)
Albumin: 3.4 g/dL — ABNORMAL LOW (ref 3.5–5.2)
Albumin: 3.5 g/dL (ref 3.5–5.2)
BUN: 11 mg/dL (ref 6–23)
Calcium: 8.8 mg/dL (ref 8.4–10.5)
Calcium: 9.2 mg/dL (ref 8.4–10.5)
Creatinine, Ser: 1.02 mg/dL (ref 0.4–1.5)
Creatinine, Ser: 4.69 mg/dL — ABNORMAL HIGH (ref 0.4–1.5)
GFR calc Af Amer: 16 mL/min — ABNORMAL LOW (ref >60)
GFR calc Af Amer: >60 mL/min (ref >60)
Sodium: 130 mEq/L — ABNORMAL LOW (ref 135–145)
Total Bilirubin: 0.7 mg/dL (ref 0.3–1.2)
Total Protein: 6.4 g/dL (ref 6.0–8.3)
Total Protein: 7 g/dL (ref 6.0–8.3)

## 2010-03-29 LAB — DIFFERENTIAL
Lymphocytes Relative: 26 % (ref 12–46)
Lymphs Abs: 2.4 10*3/uL (ref 0.7–4.0)
Neutro Abs: 5.9 10*3/uL (ref 1.7–7.7)
Neutrophils Relative %: 66 % (ref 43–77)

## 2010-03-29 LAB — MAGNESIUM: Magnesium: 2.4 mg/dL (ref 1.5–2.5)

## 2010-03-29 LAB — URINALYSIS, ROUTINE W REFLEX MICROSCOPIC
Hgb urine dipstick: NEGATIVE
Ketones, ur: 15 mg/dL — AB
Protein, ur: 30 mg/dL — AB
Urobilinogen, UA: 1 mg/dL (ref 0.0–1.0)

## 2010-03-29 LAB — URINE MICROSCOPIC-ADD ON

## 2010-03-29 LAB — TSH: TSH: 1.073 u[IU]/mL (ref 0.350–4.500)

## 2010-03-29 LAB — RAPID URINE DRUG SCREEN, HOSP PERFORMED
Amphetamines: NOT DETECTED
Barbiturates: NOT DETECTED
Benzodiazepines: NOT DETECTED
Opiates: NOT DETECTED

## 2010-03-29 LAB — BRAIN NATRIURETIC PEPTIDE: Pro B Natriuretic peptide (BNP): 642 pg/mL — ABNORMAL HIGH (ref 0.0–100.0)

## 2010-03-29 LAB — PHOSPHORUS: Phosphorus: 4.2 mg/dL (ref 2.3–4.6)

## 2010-03-29 LAB — HEMOGLOBIN A1C
Hgb A1c MFr Bld: 5.8 % — ABNORMAL HIGH (ref <5.7)
Mean Plasma Glucose: 120 mg/dL — ABNORMAL HIGH (ref <117)

## 2010-03-29 LAB — LIPID PANEL
Total CHOL/HDL Ratio: 5.8 RATIO
VLDL: 54 mg/dL — ABNORMAL HIGH (ref 0–40)

## 2010-03-29 LAB — APTT: aPTT: 30 seconds (ref 24–37)

## 2010-03-29 NOTE — Assessment & Plan Note (Signed)
Summary: 3 month rov.sl/agh rs per pt call - mb   Primary Provider:  [name]   History of Present Illness: 55 yo with history of nonischemic cardiomyopathy presents for cardiology followup.  Ethan Wilcox had a prolonged admission in 5/11 for CHF.  Echo showed EF 15-20% with diffuse hypokinesis.  Left and right heart cath showed no angiographic CAD and Fick CI 1.47.  Patient became hypotensive with ARF (cardiogenic shock) requiring milrinone and dopamine.  He was gradually titrated off these medications and begun on an oral regimen.   He saw Dr. Ladona Ridgel recently regarding an ICD but actually at that time admitted to resuming ETOH intake (about 1/2 pint/week).  Therefore, no ICD at this time.  He has stable exertional dyspnea after walking about 100 feet.  He is short of breath after climbing a flight of steps.  He continues to smoke 1/2 ppd.  No chest pain.   Labs (5/11): K 4.4, creatinine 1.23, LDL 104, HDL 33 Labs (6/11): BNP 97, K 4.8, creatinine 1.4 Labs (7/11): K 4.8, creatinine 1.0, BNP 53 Labs (10/11): BNP 25, K 4.8 => 4.3, creatinine 1.4 => 1.3 Labs (12/11): BNP 25, K 4.9, creatinine 1.2  ECG: NSR, PVC, slightly prolonged QTc  Current Medications (verified): 1)  Aspir-Low 81 Mg Tbec (Aspirin) .... Take One Tablet By Mouth Daily 2)  Coreg 12.5 Mg Tabs (Carvedilol) .... One Twice A Day 3)  Multivitamins  Tabs (Multiple Vitamin) .... Take One Tablet By Mouth Daily 4)  Spironolactone 25 Mg Tabs (Spironolactone) .... One  Tablet Daily 5)  Proventil Hfa 108 (90 Base) Mcg/act Aers (Albuterol Sulfate) .... As Needed 6)  Zolpidem Tartrate 10 Mg Tabs (Zolpidem Tartrate) .... As Needed 7)  Meclizine Hcl 25 Mg Tabs (Meclizine Hcl) .... Take One Tablet As Needed For Dizziness 8)  Lasix 40 Mg Tabs (Furosemide) .... Once Daily 9)  Lisinopril 5 Mg Tabs (Lisinopril) .... One Twice A Day 10)  Qc Stool Softener 100 Mg Caps (Docusate Sodium) .... Two Times A Day, As Needed 11)  Omeprazole 20 Mg  Cpdr (Omeprazole) .[name].. 1 Cap Two Times A Day/ Out 12)  Pepcid 20 Mg Tabs (Famotidine) .... Take 1 Tablet By Mouth Two Times A Day For Stomach/ Out 13)  Potassium Chloride Cr 10 Meq Cr-Caps (Potassium Chloride) .... Take One Tablet By Mouth Daily 14)  Tums 500 Mg Chew (Calcium Carbonate Antacid) .... Uad 15)  Ibuprofen 800 Mg Tabs (Ibuprofen) .... Take One Tablet Three Times A Day For Pain  Allergies (verified): 1)  ! Penicillin  Past History:  Family history reviewed for relevance to current acute and chronic problems.  Past Medical History: 1. Nonischemic Cardiomyopathy: Admitted 5/11 with acute systolic CHF.  LHC showed no angiographic CAD.  Echo (5/11) showed EF 15-20% with diffuse severe hypokinesis, moderate MR.  RHC showed PCWP 10, CI 1.47 (Fick).  Cause of CMP thought to be heavy ETOH and cocaine.  No ICD yet due to ongoing ETOH intake.  2. ETOH abuse: Quit drinking after 5/11 hospitalization 3. Cocaine abuse: Quit in 2011 4. Active smoker: 1/2 ppd 5. Obesity  Family History: Reviewed history from 06/08/2009 and no changes required. 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  Heavy ETOH in the past, now back to drinking 1/2 pint/week.   Review of Systems       All systems reviewed  and negative except as per HPI.   Vital Signs:  Patient profile:   55 year old male Height:      68 inches Weight:      238 pounds BMI:     36.32 Pulse rate:   95 / minute Pulse rhythm:   irregular BP sitting:   134 / 86  (left arm) Cuff size:   large  Vitals Entered By: Judithe Modest CMA (March 23, 2010 12:11 PM)  Physical Exam  General:  Well developed, well nourished, in no acute distress.  Obese.  Neck:  Neck supple, no JVD. No masses, thyromegaly or abnormal cervical nodes. Lungs:  Clear bilaterally to auscultation and percussion. Heart:  Non-displaced  PMI, chest non-tender; regular rate and rhythm, S1, S2. +S4. 1/6 HSM apex.  Carotid upstroke normal, no bruit.  Pedals normal pulses. No edema, no varicosities. Abdomen:  Bowel sounds positive; abdomen soft and non-tender without masses, organomegaly, or hernias noted. No hepatosplenomegaly. Extremities:  No clubbing or cyanosis. Neurologic:  Alert and oriented x 3. Psych:  Normal affect.   Impression & Recommendations:  Problem # 1:  CHRONIC SYSTOLIC HEART FAILURE (ICD-428.22) Nonischemic cardiomyopathy.  Possibly due to ETOH and cocaine abuse in the past. Stable NYHA class III symptoms and appears euvolemic on exam.   - Continue current doses of Lasix, spironolactone, and lisinopril.   - Increase Coreg to 18.75 mg two times a day.   - BMET today - Followup in 3 months, will try to add Bidil at that time.  - No ICD until he is abstinent from ETOH.  Patient plans to work on this.   Problem # 2:  ALCOHOL ABUSE (ICD-305.00) Strongly advised to quit drinking completely.   Problem # 3:  TOBACCO ABUSE (ICD-305.1) Advised to quit smoking.   Other Orders: TLB-BMP (Basic Metabolic Panel-BMET) (80048-METABOL)  Patient Instructions: 1)  Your physician has recommended you make the following change in your medication:  2)  Increase Coreg(carvedilol) to 18.75mg  twice a day.  This will be one and one-half  12.5mg  tablets twice a day. 3)  Lab today---BMP 428.22 4)  Your physician recommends that you schedule a follow-up appointment in: 3 months with Dr Shirlee Latch. Prescriptions: COREG 12.5 MG TABS (CARVEDILOL) one and one-half  twice a day  #90 x 6   Entered by:   Katina Dung, RN, BSN   Authorized by:   [name], MD   Signed by:   Katina Dung, RN, BSN on 03/23/2010   Method used:   Electronically to        [facility]. [address] [phone]* (retail)       [address]       [place], [place]  [address]       Ph: [phone] or [phone]       Fax: [phone]   RxID:    [id number]

## 2010-03-30 LAB — CK TOTAL AND CKMB (NOT AT ARMC)
CK, MB: 3.4 ng/mL (ref 0.3–4.0)
CK, MB: 3.6 ng/mL (ref 0.3–4.0)
CK, MB: 3.9 ng/mL (ref 0.3–4.0)
Relative Index: 1.6 (ref 0.0–2.5)
Relative Index: 1.6 (ref 0.0–2.5)
Relative Index: 1.8 (ref 0.0–2.5)
Total CK: 200 U/L (ref 7–232)
Total CK: 209 U/L (ref 7–232)
Total CK: 248 U/L — ABNORMAL HIGH (ref 7–232)

## 2010-03-30 LAB — HEMOGLOBIN A1C: Hgb A1c MFr Bld: 6 % (ref 4.6–6.1)

## 2010-03-30 LAB — DIFFERENTIAL
Basophils Absolute: 0 10*3/uL (ref 0.0–0.1)
Basophils Relative: 0 % (ref 0–1)
Eosinophils Absolute: 0.1 10*3/uL (ref 0.0–0.7)
Eosinophils Relative: 1 % (ref 0–5)

## 2010-03-30 LAB — CARDIAC PANEL(CRET KIN+CKTOT+MB+TROPI)
Relative Index: 1.7 (ref 0.0–2.5)
Troponin I: 0.11 ng/mL — ABNORMAL HIGH (ref 0.00–0.06)
Troponin I: 0.11 ng/mL — ABNORMAL HIGH (ref 0.00–0.06)

## 2010-03-30 LAB — CBC
HCT: 39.6 % (ref 39.0–52.0)
MCV: 93.7 fL (ref 78.0–100.0)
Platelets: 220 10*3/uL (ref 150–400)
RDW: 14.5 % (ref 11.5–15.5)

## 2010-03-30 LAB — URINALYSIS, ROUTINE W REFLEX MICROSCOPIC
Ketones, ur: 15 mg/dL — AB
Leukocytes, UA: NEGATIVE
Nitrite: NEGATIVE
Protein, ur: 30 mg/dL — AB
Urobilinogen, UA: 0.2 mg/dL (ref 0.0–1.0)

## 2010-03-30 LAB — TROPONIN I: Troponin I: 0.11 ng/mL — ABNORMAL HIGH (ref 0.00–0.06)

## 2010-03-30 LAB — LIPID PANEL
Cholesterol: 150 mg/dL (ref 0–200)
Total CHOL/HDL Ratio: 3.9 RATIO

## 2010-03-30 LAB — RAPID URINE DRUG SCREEN, HOSP PERFORMED
Amphetamines: NOT DETECTED
Tetrahydrocannabinol: POSITIVE — AB

## 2010-03-30 LAB — BASIC METABOLIC PANEL
BUN: 15 mg/dL (ref 6–23)
Chloride: 109 mEq/L (ref 96–112)
GFR calc non Af Amer: >60 mL/min (ref >60)
Glucose, Bld: 94 mg/dL (ref 70–99)
Potassium: 4.7 mEq/L (ref 3.5–5.1)

## 2010-03-30 LAB — BRAIN NATRIURETIC PEPTIDE: Pro B Natriuretic peptide (BNP): 796 pg/mL — ABNORMAL HIGH (ref 0.0–100.0)

## 2010-03-30 LAB — URINE MICROSCOPIC-ADD ON

## 2010-03-30 LAB — PHOSPHORUS: Phosphorus: 3.8 mg/dL (ref 2.3–4.6)

## 2010-03-30 NOTE — Telephone Encounter (Signed)
Paper refill request received for Zolpidem 10 mg.  We do not fill this medication for this patient.  Will send denial to pharmacy.  Pt to follow up with PCP.  Judithe Modest, CMA

## 2010-04-07 NOTE — Letter (Signed)
Summary: Generic Letter  Architectural technologist, Main Office  [address]   [place], [place] [address]   Phone: [phone]  Fax: [phone]        March 29, 2010 MRN: [id number]    [facility] [address] [place], [place]  [address]    Dear Mr. Halbert,  Your lab done 03/23/10 was OK. Please call our office if you have any questions.         Sincerely,     Katina Dung, RN, BSN  This letter has been electronically signed by your physician.

## 2010-04-07 NOTE — Letter (Signed)
Summary: Generic Letter  Architectural technologist, Main Office  [address]   [place], [place] [address]   Phone: [phone]  Fax: [phone]        March 29, 2010 MRN: [id number]    [facility] [address] [place], [place]  [address]    Dear Mr. Watson,    Your lab done 03/24/10 was OK. Please call our office if you have questions.        Sincerely,     Katina Dung, RN, BSN  This letter has been electronically signed by your physician.  Appended Document: Generic Letter lab was done 03/23/10--I mailed pt a corrected letter

## 2010-04-14 ENCOUNTER — Other Ambulatory Visit: Payer: Self-pay | Admitting: *Deleted

## 2010-04-14 LAB — RAPID STREP SCREEN (MED CTR MEBANE ONLY): Streptococcus, Group A Screen (Direct): NEGATIVE

## 2010-04-14 MED ORDER — FUROSEMIDE 40 MG PO TABS: 40.0000 mg | ORAL_TABLET | Freq: Every day | ORAL | Status: DC

## 2010-04-15 LAB — COMPREHENSIVE METABOLIC PANEL
Albumin: 3.8 g/dL (ref 3.5–5.2)
Alkaline Phosphatase: 81 U/L (ref 39–117)
BUN: 19 mg/dL (ref 6–23)
Creatinine, Ser: 1.57 mg/dL — ABNORMAL HIGH (ref 0.4–1.5)
GFR calc Af Amer: 57 mL/min — ABNORMAL LOW (ref >60)
Potassium: 4.1 mEq/L (ref 3.5–5.1)
Total Protein: 6.9 g/dL (ref 6.0–8.3)

## 2010-04-15 LAB — GLUCOSE, CAPILLARY

## 2010-04-18 ENCOUNTER — Telehealth: Payer: Self-pay | Admitting: Cardiology

## 2010-04-18 NOTE — Telephone Encounter (Signed)
Pt needs furosemide 40mg  to be called in to [facility] # [phone]

## 2010-04-18 NOTE — Telephone Encounter (Signed)
This has already been called into the pharmacy tried to call the pt back but the numbers has been disconnected

## 2010-05-11 ENCOUNTER — Emergency Department (HOSPITAL_COMMUNITY)
Admission: EM | Admit: 2010-05-11 | Discharge: 2010-05-11 | Disposition: A | Discharge status: Home / Self Care | Payer: Medicaid Other | Attending: Emergency Medicine | Admitting: Emergency Medicine

## 2010-05-11 ENCOUNTER — Emergency Department (HOSPITAL_COMMUNITY): Payer: Medicaid Other

## 2010-05-11 DIAGNOSIS — M79609 Pain in unspecified limb: Secondary | ICD-10-CM | POA: Insufficient documentation

## 2010-05-11 DIAGNOSIS — Z79899 Other long term (current) drug therapy: Secondary | ICD-10-CM | POA: Insufficient documentation

## 2010-05-11 DIAGNOSIS — I509 Heart failure, unspecified: Secondary | ICD-10-CM | POA: Insufficient documentation

## 2010-05-11 DIAGNOSIS — J449 Chronic obstructive pulmonary disease, unspecified: Secondary | ICD-10-CM | POA: Insufficient documentation

## 2010-05-11 DIAGNOSIS — F17200 Nicotine dependence, unspecified, uncomplicated: Secondary | ICD-10-CM | POA: Insufficient documentation

## 2010-05-11 DIAGNOSIS — I10 Essential (primary) hypertension: Secondary | ICD-10-CM | POA: Insufficient documentation

## 2010-05-11 DIAGNOSIS — J4489 Other specified chronic obstructive pulmonary disease: Secondary | ICD-10-CM | POA: Insufficient documentation

## 2010-06-09 ENCOUNTER — Encounter: Payer: Self-pay | Admitting: Cardiology

## 2010-06-24 ENCOUNTER — Ambulatory Visit: Payer: Medicaid Other | Admitting: Cardiology

## 2010-06-28 ENCOUNTER — Encounter: Payer: Self-pay | Admitting: Cardiology

## 2010-09-19 ENCOUNTER — Ambulatory Visit: Payer: Medicaid Other | Admitting: Cardiology

## 2010-10-06 LAB — CBC
MCHC: 35.8
MCV: 89.7
RBC: 4.6
RDW: 14

## 2010-10-06 LAB — POCT CARDIAC MARKERS
CKMB, poc: 3.6
Myoglobin, poc: 76.2
Operator id: 294501
Troponin i, poc: <0.05

## 2010-10-06 LAB — DIFFERENTIAL
Monocytes Absolute: 0.5
Monocytes Relative: 7
Neutro Abs: 4.9
Neutrophils Relative %: 62

## 2010-10-06 LAB — URINE MICROSCOPIC-ADD ON

## 2010-10-06 LAB — URINALYSIS, ROUTINE W REFLEX MICROSCOPIC
Glucose, UA: NEGATIVE
Leukocytes, UA: NEGATIVE
Nitrite: NEGATIVE
Specific Gravity, Urine: 1.025
pH: 5

## 2010-10-06 LAB — POCT I-STAT, CHEM 8
BUN: 31 — ABNORMAL HIGH
Creatinine, Ser: 1.3
Sodium: 135
TCO2: 21

## 2010-10-12 ENCOUNTER — Ambulatory Visit: Payer: Medicaid Other | Admitting: Cardiology

## 2010-10-20 LAB — POCT CARDIAC MARKERS
CKMB, poc: 1.3
Myoglobin, poc: 85
Operator id: 234501
Operator id: 257131
Troponin i, poc: <0.05

## 2010-10-20 LAB — CBC
RBC: 4.76
WBC: 9

## 2010-10-20 LAB — DIFFERENTIAL
Lymphocytes Relative: 26
Lymphs Abs: 2.3
Monocytes Relative: 7
Neutro Abs: 5.9
Neutrophils Relative %: 65

## 2010-10-20 LAB — BASIC METABOLIC PANEL
BUN: 15
CO2: 25
Calcium: 9.4
Chloride: 106
Creatinine, Ser: 0.96
GFR calc Af Amer: >60
GFR calc non Af Amer: >60
Glucose, Bld: 118 — ABNORMAL HIGH
Potassium: 4.2
Sodium: 141

## 2010-10-20 LAB — D-DIMER, QUANTITATIVE (NOT AT ARMC): D-Dimer, Quant: <0.22

## 2010-11-24 ENCOUNTER — Encounter: Payer: Self-pay | Admitting: Cardiology

## 2010-11-24 ENCOUNTER — Ambulatory Visit (INDEPENDENT_AMBULATORY_CARE_PROVIDER_SITE_OTHER): Payer: Medicaid Other | Admitting: Cardiology

## 2010-11-24 DIAGNOSIS — G4733 Obstructive sleep apnea (adult) (pediatric): Secondary | ICD-10-CM

## 2010-11-24 DIAGNOSIS — F17200 Nicotine dependence, unspecified, uncomplicated: Secondary | ICD-10-CM

## 2010-11-24 DIAGNOSIS — F1010 Alcohol abuse, uncomplicated: Secondary | ICD-10-CM

## 2010-11-24 DIAGNOSIS — R0989 Other specified symptoms and signs involving the circulatory and respiratory systems: Secondary | ICD-10-CM

## 2010-11-24 DIAGNOSIS — R0609 Other forms of dyspnea: Secondary | ICD-10-CM

## 2010-11-24 DIAGNOSIS — I5022 Chronic systolic (congestive) heart failure: Secondary | ICD-10-CM

## 2010-11-24 MED ORDER — ISOSORB DINITRATE-HYDRALAZINE 20-37.5 MG PO TABS: ORAL_TABLET | ORAL | Status: DC

## 2010-11-24 NOTE — Progress Notes (Signed)
PCP: Dr. Julio Sicks  55 yo with history of nonischemic cardiomyopathy presents for cardiology followup. Ethan Wilcox had a prolonged admission in 5/11 for CHF. Echo showed EF 15-20% with diffuse hypokinesis. Left and right heart cath showed no angiographic CAD and Fick CI 1.47. Patient became hypotensive with ARF (cardiogenic shock) requiring milrinone and dopamine. He was gradually titrated off these medications and begun on an oral regimen.  He saw Dr. Ladona Ridgel this year regarding an ICD but actually at that time admitted to resuming ETOH intake. Therefore, no ICD at this time.   Ethan Wilcox has missed several followup appointments.  He has gained 14 lbs since I last saw him and admits to significant dietary indiscretion.  He is short of breath after walking about 50 feet.  This seems to have steadily worsened over time.  He has chronic orthopnea.  He is fatigued always during the day, sleeps poorly, and snores.  He is drinking about 1/2 pint ETOH daily . He is still smoking.    Labs (5/11): K 4.4, creatinine 1.23, LDL 104, HDL 33  Labs (6/11): BNP 97, K 4.8, creatinine 1.4  Labs (7/11): K 4.8, creatinine 1.0, BNP 53  Labs (10/11): BNP 25, K 4.8 => 4.3, creatinine 1.4 => 1.3  Labs (12/11): BNP 25, K 4.9, creatinine 1.2  Labs (3/12): K 4.3, creatinine 1.2  ECG: NSR, QTc 500 msec   Allergies (verified):  1) ! Penicillin   Past Medical History:  1. Nonischemic Cardiomyopathy: Admitted 5/11 with acute systolic CHF. LHC showed no angiographic CAD. Echo (5/11) showed EF 15-20% with diffuse severe hypokinesis, moderate Ethan. RHC showed PCWP 10, CI 1.47 (Fick). Cause of CMP thought to be heavy ETOH and cocaine. No ICD yet due to ongoing ETOH intake.  2. ETOH abuse 3. Cocaine abuse: Quit in 2011  4. Active smoker: 1/2 ppd  5. 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 Heavy ETOH in the past, now back to  drinking 1/2 pint daily.   Review of Systems  All systems reviewed and negative except as per HPI.   Current Outpatient Prescriptions  Medication Sig Dispense Refill  . albuterol (PROVENTIL HFA) 108 (90 BASE) MCG/ACT inhaler Inhale 2 puffs into the lungs as needed.        [name] aspirin (ASPIR-LOW) 81 MG EC tablet Take 81 mg by mouth daily.        . calcium carbonate (TUMS) 500 MG chewable tablet Chew 1 tablet by mouth as directed.        . carvedilol (COREG) 12.5 MG tablet Take 12.5 mg by mouth 2 (two) times daily. Take 1 1/2 tabs      . docusate sodium (QC STOOL SOFTENER) 100 MG capsule Take 100 mg by mouth 2 (two) times daily as needed.        . famotidine (PEPCID) 20 MG tablet Take 20 mg by mouth 2 (two) times daily. For stomach/  out       . fenofibrate (TRICOR) 145 MG tablet Take 145 mg by mouth daily.        . furosemide (LASIX) 40 MG tablet Take 1 tablet (40 mg total) by mouth daily.  30 tablet  11  . lisinopril (PRINIVIL,ZESTRIL) 5 MG tablet Take 5 mg by mouth 2 (two) times daily.        . meclizine (ANTIVERT) 25 MG tablet Take 25 mg by  mouth as needed. For dizziness       . Multiple Vitamins-Minerals (MULTIVITAL) tablet Take 1 tablet by mouth daily.        . Omeprazole 20 MG TBEC Take by mouth. 1 cap two times a day/  out       . potassium chloride (MICRO-K) 10 MEQ CR capsule Take 10 mEq by mouth daily.        [name] spironolactone (ALDACTONE) 25 MG tablet Take 25 mg by mouth daily.        [name] zolpidem (AMBIEN) 10 MG tablet Take 10 mg by mouth as needed.        . isosorbide-hydrALAZINE (BIDIL) 20-37.5 MG per tablet 1/2 tablet twice a day  30 tablet  6    BP 123/88  Pulse 95  Ht 5\' 7"  (1.702 m)  Wt 114.306 kg (252 lb)  BMI 39.47 kg/m2 General: NAD, obese Neck: Thick, JVP difficult, no thyromegaly or thyroid nodule.  Lungs: Clear to auscultation bilaterally with normal respiratory effort. CV: Nondisplaced PMI.  Heart regular S1/S2, no S3/S4, no murmur.  No peripheral edema.  No carotid  bruit.  Normal pedal pulses.  Abdomen: Soft, nontender, no hepatosplenomegaly, no distention.  Neurologic: Alert and oriented x 3.  Psych: Normal affect. Extremities: No clubbing or cyanosis.

## 2010-11-24 NOTE — Patient Instructions (Addendum)
Start Bidil 20/37.5mg  one-half tablet twice a day. Let me know if you cannot get this medication. [name] [phone]  Your physician recommends that you return for a FASTING lipid profile/BMP/BNP/Magnesium tomorrow--428.22  Your physician has recommended that you have a sleep study. This test records several body functions during sleep, including: brain activity, eye movement, oxygen and carbon dioxide blood levels, heart rate and rhythm, breathing rate and rhythm, the flow of air through your mouth and nose, snoring, body muscle movements, and chest and belly movement.  Schedule an appointment in the Heart Failure Clinic at the [facility] in 1 month.  Your physician recommends that you schedule a follow-up appointment in: 3 months with Dr Shirlee Latch.

## 2010-11-25 ENCOUNTER — Other Ambulatory Visit: Payer: Self-pay | Admitting: Cardiology

## 2010-11-25 ENCOUNTER — Other Ambulatory Visit (INDEPENDENT_AMBULATORY_CARE_PROVIDER_SITE_OTHER): Payer: Medicaid Other | Admitting: *Deleted

## 2010-11-25 DIAGNOSIS — I5022 Chronic systolic (congestive) heart failure: Secondary | ICD-10-CM

## 2010-11-25 LAB — BASIC METABOLIC PANEL
CO2: 27 mEq/L (ref 19–32)
Calcium: 9.6 mg/dL (ref 8.4–10.5)
Chloride: 102 mEq/L (ref 96–112)
Creatinine, Ser: 1.3 mg/dL (ref 0.4–1.5)
Glucose, Bld: 106 mg/dL — ABNORMAL HIGH (ref 70–99)

## 2010-11-25 LAB — LIPID PANEL
Cholesterol: 168 mg/dL (ref 0–200)
HDL: 39.1 mg/dL (ref >39.00)
Total CHOL/HDL Ratio: 4
VLDL: 103.6 mg/dL — ABNORMAL HIGH (ref 0.0–40.0)

## 2010-11-27 DIAGNOSIS — G4733 Obstructive sleep apnea (adult) (pediatric): Secondary | ICD-10-CM | POA: Insufficient documentation

## 2010-11-27 NOTE — Assessment & Plan Note (Signed)
Probable ETOH cardiomyopathy.  I strongly encouraged Ethan Wilcox to quit drinking.

## 2010-11-27 NOTE — Assessment & Plan Note (Addendum)
Symptoms concerning for OSA.  I will set him up for a sleep study.   Patient's QT interval is prolonged.  I will check K and Mg today.

## 2010-11-27 NOTE — Assessment & Plan Note (Signed)
Nonischemic CMP, suspect it may be due to ETOH.  Patient unfortunately is still drinking ETOH.  NYHA class III symptoms.  Exam is difficult for volume, but he does not appear to be severely volume overloaded.   - Continue Coreg and lisinopril. - Continue spironolactone for now, but if he cannot followup regularly for labs , etc, he really should not be on spironolactone.   - Start Bidil 1/2 tab tid.  - BMET/BNP today.  - Long discussion today about how he needs to stop drinking.  If he is able to stop drinking and EF still remains low, he will need an ICD.  - Followup 1 month in CHF clinic and 3 months with me.

## 2010-11-27 NOTE — Assessment & Plan Note (Signed)
I encouraged him to quit smoking

## 2010-12-02 ENCOUNTER — Encounter: Payer: Self-pay | Admitting: *Deleted

## 2010-12-21 ENCOUNTER — Encounter (HOSPITAL_BASED_OUTPATIENT_CLINIC_OR_DEPARTMENT_OTHER): Payer: Medicaid Other

## 2010-12-26 ENCOUNTER — Inpatient Hospital Stay (HOSPITAL_COMMUNITY): Admission: RE | Admit: 2010-12-26 | Payer: Medicaid Other | Source: Ambulatory Visit

## 2010-12-28 ENCOUNTER — Telehealth: Payer: Self-pay | Admitting: *Deleted

## 2010-12-28 NOTE — Telephone Encounter (Signed)
   Chriscoe, Glenna L - no show for Dr. Gala Romney More Detail >>      no show for Dr. Kae Heller Chriscoe        Sent: Tue December 27, 2010  2:08 PM    To: Jacqlyn Krauss, RN        CAMDIN HEGNER    MRN: [id number] DOB: [birth year]     Pt Work: [phone] Pt Home: [phone]           Message     This patient was a no show for Dr. Gala Romney. Info only Thanks, [employer]

## 2011-01-17 ENCOUNTER — Ambulatory Visit (HOSPITAL_BASED_OUTPATIENT_CLINIC_OR_DEPARTMENT_OTHER): Payer: Medicaid Other

## 2011-01-31 ENCOUNTER — Encounter (HOSPITAL_COMMUNITY): Payer: Self-pay | Admitting: Emergency Medicine

## 2011-01-31 ENCOUNTER — Emergency Department (HOSPITAL_COMMUNITY)
Admission: EM | Admit: 2011-01-31 | Discharge: 2011-01-31 | Disposition: A | Discharge status: Home / Self Care | Payer: Medicaid Other | Attending: Emergency Medicine | Admitting: Emergency Medicine

## 2011-01-31 DIAGNOSIS — L723 Sebaceous cyst: Secondary | ICD-10-CM | POA: Insufficient documentation

## 2011-01-31 DIAGNOSIS — E669 Obesity, unspecified: Secondary | ICD-10-CM | POA: Insufficient documentation

## 2011-01-31 DIAGNOSIS — I509 Heart failure, unspecified: Secondary | ICD-10-CM | POA: Insufficient documentation

## 2011-01-31 DIAGNOSIS — K219 Gastro-esophageal reflux disease without esophagitis: Secondary | ICD-10-CM | POA: Insufficient documentation

## 2011-01-31 DIAGNOSIS — Z79899 Other long term (current) drug therapy: Secondary | ICD-10-CM | POA: Insufficient documentation

## 2011-01-31 DIAGNOSIS — I10 Essential (primary) hypertension: Secondary | ICD-10-CM | POA: Insufficient documentation

## 2011-01-31 DIAGNOSIS — F17200 Nicotine dependence, unspecified, uncomplicated: Secondary | ICD-10-CM | POA: Insufficient documentation

## 2011-01-31 HISTORY — DX: Gastro-esophageal reflux disease without esophagitis: K21.9

## 2011-01-31 HISTORY — DX: Essential (primary) hypertension: I10

## 2011-01-31 MED ORDER — IBUPROFEN 800 MG PO TABS: 800.0000 mg | ORAL_TABLET | Freq: Once | ORAL | Status: DC

## 2011-01-31 NOTE — ED Notes (Signed)
PT. REPORTS PROGRESSING LUMP "BOIL" AT LEFT LATERAL FOREHEAD  FOR 3 DAYS , DENIES INJURY OR FALL , NO DRAINAGE.

## 2011-01-31 NOTE — ED Provider Notes (Signed)
History     CSN: [id number]  Arrival date & time 01/31/11  7829   First MD Initiated Contact with Patient 01/31/11 2143      Chief Complaint  Patient presents with  . Mass    (Consider location/radiation/quality/duration/timing/severity/associated sxs/prior treatment) HPI Comments: Patient noticed an increase in the, nodule.  He's had on his left temporal area that is now sore for the past 3 days.  No drainage.  No trauma to the area  The history is provided by the patient.    Past Medical History  Diagnosis Date  . Nonischemic cardiomyopathy     admitted 5/11 w acute systolic CHF. LHC showed no angiographic CAD. Echo (5/11) showed EF 15-20% w diffuse sever hypokinesis, moderate MR> RHC showed PCWP 10, CI 1.47 (Fick.) cause of CMP thought to be heavy ETOH and cocaine. No ICD yet due to ongoing ETOH intake  . ETOH abuse     quit drinking after 5/11 hospitalization  . Cocaine abuse   . Active smoker /    1/2 ppd  . Obesity   . Hypertension   . CHF (congestive heart failure)   . Gout   . GERD (gastroesophageal reflux disease)     History reviewed. No pertinent past surgical history.  Family History  Problem Relation Age of Onset  . Heart disease Mother     History  Substance Use Topics  . Smoking status: Current Everyday Smoker  . Smokeless tobacco: Not on file  . Alcohol Use: Yes      Review of Systems  Constitutional: Negative for fever.  HENT: Negative for ear pain, neck pain, neck stiffness and ear discharge.   Musculoskeletal: Negative for myalgias and back pain.  Neurological: Negative for dizziness, weakness and headaches.    Allergies  Penicillins  Home Medications   Current Outpatient Rx  Name Route Sig Dispense Refill  . ALBUTEROL SULFATE HFA 108 (90 BASE) MCG/ACT IN AERS Inhalation Inhale 2 puffs into the lungs every 4 (four) hours as needed. For shortness of breath    . ASPIRIN 81 MG PO TBEC Oral Take 81 mg by mouth daily.     [name] CARVEDILOL  12.5 MG PO TABS Oral Take 12.5 mg by mouth 2 (two) times daily. Take 1 1/2 tabs    . FAMOTIDINE 20 MG PO TABS Oral Take 20 mg by mouth 2 (two) times daily. For stomach/  out     . FUROSEMIDE 40 MG PO TABS Oral Take 40 mg by mouth daily.    . ISOSORB DINITRATE-HYDRALAZINE 20-37.5 MG PO TABS Oral Take 0.5 tablets by mouth 2 (two) times daily. 1/2 tablet twice a day    . LISINOPRIL 5 MG PO TABS Oral Take 5 mg by mouth 2 (two) times daily.      . MULTIVITAL PO TABS Oral Take 1 tablet by mouth daily.      [name] OMEPRAZOLE 20 MG PO TBEC Oral Take by mouth. 1 cap two times a day/  out     . POTASSIUM CHLORIDE 10 MEQ PO CPCR Oral Take 10 mEq by mouth daily.      [name] SPIRONOLACTONE 25 MG PO TABS Oral Take 25 mg by mouth daily.        BP 134/86  Pulse 74  Temp(Src) 98 F (36.7 C) (Oral)  Resp 22  SpO2 96%  Physical Exam  Constitutional: He is oriented to person, place, and time. He appears well-developed and well-nourished.  HENT:  Head: Normocephalic.  Neck:  Normal range of motion.  Cardiovascular: Normal rate.   Pulmonary/Chest: Effort normal.  Musculoskeletal: Normal range of motion.  Neurological: He is alert and oriented to person, place, and time.  Skin: Skin is warm and dry.       Firm mobile nodule approximately a half a centimeter in circumference  Psychiatric: He has a normal mood and affect.    ED Course  Procedures (including critical care time)  Labs Reviewed - No data to display No results found.   1. Sebaceous cyst       MDM  Sebaceous cyst        Arman Filter, NP 01/31/11 2147  Arman Filter, NP 01/31/11 2150

## 2011-01-31 NOTE — ED Provider Notes (Signed)
Medical screening examination/treatment/procedure(s) were performed by non-physician practitioner and as supervising physician I was immediately available for consultation/collaboration.  Quetzal Meany R. Lezli Danek, MD 01/31/11 2329 

## 2011-02-02 ENCOUNTER — Telehealth (INDEPENDENT_AMBULATORY_CARE_PROVIDER_SITE_OTHER): Payer: Self-pay

## 2011-02-02 NOTE — Telephone Encounter (Signed)
Pt called yesterday to schedule and appointment for I&D of an abscess on the temple. He was previously seen in the ER.  He had tried to schedule one with ENT, but was told to call general surgery.  I called today and spoke with his wife who said she would relay the message to her husband.

## 2011-02-28 ENCOUNTER — Emergency Department (HOSPITAL_COMMUNITY): Payer: Medicaid Other

## 2011-02-28 ENCOUNTER — Other Ambulatory Visit (HOSPITAL_COMMUNITY): Payer: Medicaid Other

## 2011-02-28 ENCOUNTER — Emergency Department (HOSPITAL_COMMUNITY)
Admission: EM | Admit: 2011-02-28 | Discharge: 2011-02-28 | Disposition: A | Discharge status: Home / Self Care | Payer: Medicaid Other | Attending: Emergency Medicine | Admitting: Emergency Medicine

## 2011-02-28 ENCOUNTER — Ambulatory Visit (INDEPENDENT_AMBULATORY_CARE_PROVIDER_SITE_OTHER): Payer: Medicaid Other | Admitting: Cardiology

## 2011-02-28 ENCOUNTER — Encounter (HOSPITAL_COMMUNITY): Payer: Self-pay | Admitting: Emergency Medicine

## 2011-02-28 ENCOUNTER — Other Ambulatory Visit: Payer: Self-pay

## 2011-02-28 ENCOUNTER — Encounter: Payer: Self-pay | Admitting: Cardiology

## 2011-02-28 VITALS — BP 122/70 | HR 76 | Ht 67.0 in | Wt 244.0 lb

## 2011-02-28 DIAGNOSIS — I5022 Chronic systolic (congestive) heart failure: Secondary | ICD-10-CM

## 2011-02-28 DIAGNOSIS — G4733 Obstructive sleep apnea (adult) (pediatric): Secondary | ICD-10-CM

## 2011-02-28 DIAGNOSIS — I10 Essential (primary) hypertension: Secondary | ICD-10-CM | POA: Insufficient documentation

## 2011-02-28 DIAGNOSIS — I517 Cardiomegaly: Secondary | ICD-10-CM | POA: Insufficient documentation

## 2011-02-28 DIAGNOSIS — R0609 Other forms of dyspnea: Secondary | ICD-10-CM

## 2011-02-28 DIAGNOSIS — IMO0001 Reserved for inherently not codable concepts without codable children: Secondary | ICD-10-CM | POA: Insufficient documentation

## 2011-02-28 DIAGNOSIS — R51 Headache: Secondary | ICD-10-CM | POA: Insufficient documentation

## 2011-02-28 DIAGNOSIS — F17200 Nicotine dependence, unspecified, uncomplicated: Secondary | ICD-10-CM | POA: Insufficient documentation

## 2011-02-28 DIAGNOSIS — Z79899 Other long term (current) drug therapy: Secondary | ICD-10-CM | POA: Insufficient documentation

## 2011-02-28 DIAGNOSIS — R55 Syncope and collapse: Secondary | ICD-10-CM

## 2011-02-28 DIAGNOSIS — F1010 Alcohol abuse, uncomplicated: Secondary | ICD-10-CM

## 2011-02-28 DIAGNOSIS — R42 Dizziness and giddiness: Secondary | ICD-10-CM | POA: Insufficient documentation

## 2011-02-28 LAB — COMPREHENSIVE METABOLIC PANEL
ALT: 96 U/L — ABNORMAL HIGH (ref 0–53)
Albumin: 4.3 g/dL (ref 3.5–5.2)
BUN: 20 mg/dL (ref 6–23)
Calcium: 10.4 mg/dL (ref 8.4–10.5)
Chloride: 98 mEq/L (ref 96–112)
GFR calc Af Amer: 58 mL/min — ABNORMAL LOW (ref >90)
Glucose, Bld: 120 mg/dL — ABNORMAL HIGH (ref 70–99)
Sodium: 138 mEq/L (ref 135–145)
Total Protein: 8.2 g/dL (ref 6.0–8.3)

## 2011-02-28 LAB — URINALYSIS, ROUTINE W REFLEX MICROSCOPIC
Glucose, UA: NEGATIVE mg/dL
Protein, ur: >300 mg/dL — AB
Specific Gravity, Urine: 1.027 (ref 1.005–1.030)
pH: 5.5 (ref 5.0–8.0)

## 2011-02-28 LAB — URINE MICROSCOPIC-ADD ON

## 2011-02-28 LAB — PRO B NATRIURETIC PEPTIDE: Pro B Natriuretic peptide (BNP): 368.2 pg/mL — ABNORMAL HIGH (ref 0–125)

## 2011-02-28 LAB — DIFFERENTIAL
Lymphocytes Relative: 30 % (ref 12–46)
Lymphs Abs: 3 10*3/uL (ref 0.7–4.0)
Monocytes Relative: 6 % (ref 3–12)
Neutro Abs: 6.1 10*3/uL (ref 1.7–7.7)
Neutrophils Relative %: 63 % (ref 43–77)

## 2011-02-28 LAB — POTASSIUM: Potassium: 5 mEq/L (ref 3.5–5.1)

## 2011-02-28 LAB — BASIC METABOLIC PANEL
BUN: 19 mg/dL (ref 6–23)
Calcium: 9.9 mg/dL (ref 8.4–10.5)
GFR: 67.85 mL/min (ref >60.00)
Potassium: 5.2 mEq/L — ABNORMAL HIGH (ref 3.5–5.1)
Sodium: 136 mEq/L (ref 135–145)

## 2011-02-28 LAB — CBC
HCT: 47.1 % (ref 39.0–52.0)
Hemoglobin: 16.4 g/dL (ref 13.0–17.0)
WBC: 9.9 10*3/uL (ref 4.0–10.5)

## 2011-02-28 LAB — POCT I-STAT TROPONIN I: Troponin i, poc: 0.03 ng/mL (ref 0.00–0.08)

## 2011-02-28 MED ORDER — OMEGA-3-ACID ETHYL ESTERS 1 G PO CAPS: 2.0000 g | ORAL_CAPSULE | Freq: Two times a day (BID) | ORAL | Status: DC

## 2011-02-28 MED ORDER — ISOSORB DINITRATE-HYDRALAZINE 20-37.5 MG PO TABS: ORAL_TABLET | ORAL | Status: DC

## 2011-02-28 MED ORDER — LISINOPRIL 10 MG PO TABS: 10.0000 mg | ORAL_TABLET | Freq: Two times a day (BID) | ORAL | Status: DC

## 2011-02-28 MED ORDER — ALUM & MAG HYDROXIDE-SIMETH 200-200-20 MG/5ML PO SUSP
30.0000 mL | Freq: Once | ORAL | Status: AC
  Administered 2011-02-28: 30 mL via ORAL
  Filled 2011-02-28: qty 30

## 2011-02-28 NOTE — ED Notes (Addendum)
Pt was at an Internet cafe when he suddenly became dizzy, nauseous, and diaphoretic. Pt also c/o intermittent cramping in upper abd over the past few days.  Pt states he hasn't eaten anything today.  Pt states all of his symptoms have resolved except for the upper abd cramping.

## 2011-02-28 NOTE — ED Notes (Signed)
[id number]<JY> Expected date:<BR> Expected time:<BR> Means of arrival:<BR> Comments:<BR> MEDIC 40- Weakness

## 2011-02-28 NOTE — ED Notes (Signed)
Pt reminded of need for urine specimen 

## 2011-02-28 NOTE — ED Provider Notes (Signed)
History     CSN: [id number]  Arrival date & time 02/28/11  1313   First MD Initiated Contact with Patient 02/28/11 1334      Chief Complaint  Patient presents with  . Dizziness    (Consider location/radiation/quality/duration/timing/severity/associated sxs/prior treatment) HPI Patient is a 56 year old male who presents today complaining of near-syncope. Patient was sitting at an Internet cafe using a computer when he suddenly felt lightheaded and laid down on the floor.  He has a history of congestive heart failure as well as cardiomyopathy but denies any chest pain with this. Patient not have any shortness of breath or recent fevers or cough. He denies any urinary symptoms. Patient has no neurologic symptoms at this time. He does report having cramping in his muscles. He has this normally with hypokalemia and is treated with potassium for this. There are no other associated or modifying factors.  Past Medical History  Diagnosis Date  . Nonischemic cardiomyopathy     admitted 5/11 w acute systolic CHF. LHC showed no angiographic CAD. Echo (5/11) showed EF 15-20% w diffuse sever hypokinesis, moderate MR> RHC showed PCWP 10, CI 1.47 (Fick.) cause of CMP thought to be heavy ETOH and cocaine. No ICD yet due to ongoing ETOH intake  . ETOH abuse     quit drinking after 5/11 hospitalization  . Cocaine abuse   . Active smoker /    1/2 ppd  . Obesity   . Hypertension   . CHF (congestive heart failure)   . Gout   . GERD (gastroesophageal reflux disease)     History reviewed. No pertinent past surgical history.  Family History  Problem Relation Age of Onset  . Heart disease Mother     History  Substance Use Topics  . Smoking status: Current Everyday Smoker -- 0.5 packs/day  . Smokeless tobacco: Not on file  . Alcohol Use: Yes      Review of Systems  Constitutional: Negative.   HENT: Negative.   Eyes: Negative.   Respiratory: Negative.   Cardiovascular: Negative.     Gastrointestinal: Negative.   Genitourinary: Negative.   Musculoskeletal: Positive for myalgias.  Skin: Negative.   Neurological: Positive for light-headedness.       Near syncope  Hematological: Negative.   Psychiatric/Behavioral: Negative.   All other systems reviewed and are negative.    Allergies  Penicillins  Home Medications   Current Outpatient Rx  Name Route Sig Dispense Refill  . ALBUTEROL SULFATE HFA 108 (90 BASE) MCG/ACT IN AERS Inhalation Inhale 2 puffs into the lungs every 4 (four) hours as needed. For shortness of breath    . ASPIRIN 81 MG PO TBEC Oral Take 81 mg by mouth daily.     [name] CARVEDILOL 12.5 MG PO TABS Oral Take 12.5 mg by mouth 2 (two) times daily. Take 1 1/2 tabs    . FAMOTIDINE 20 MG PO TABS Oral Take 20 mg by mouth 2 (two) times daily. For stomach/  out     . FUROSEMIDE 40 MG PO TABS Oral Take 40 mg by mouth daily.    . ISOSORB DINITRATE-HYDRALAZINE 20-37.5 MG PO TABS  1/2 tablet three times a day 45 tablet 6  . LISINOPRIL 10 MG PO TABS Oral Take 1 tablet (10 mg total) by mouth 2 (two) times daily. 60 tablet 6  . MAGNESIUM OXIDE 400 MG PO TABS Oral Take 1 tablet (400 mg total) by mouth daily.    . MULTIVITAL PO TABS Oral Take 1 tablet  by mouth daily.      . OMEGA-3-ACID ETHYL ESTERS 1 G PO CAPS Oral Take 2 capsules (2 g total) by mouth 2 (two) times daily. 120 capsule 6  . OMEPRAZOLE 20 MG PO TBEC Oral Take by mouth. 1 cap two times a day/  out     . POTASSIUM CHLORIDE 10 MEQ PO CPCR Oral Take 10 mEq by mouth daily.      [name] SPIRONOLACTONE 25 MG PO TABS Oral Take 25 mg by mouth daily.        BP 103/78  Pulse 61  Temp(Src) 97.4 F (36.3 C) (Oral)  Resp 18  SpO2 99%  Physical Exam  Nursing note and vitals reviewed. GEN: Well-developed, well-nourished male in no distress HEENT: Atraumatic, normocephalic. Oropharynx clear without erythema EYES: PERRLA BL, no scleral icterus. NECK: Trachea midline, no meningismus CV: regular rate and rhythm. No  murmurs, rubs, or gallops PULM: No respiratory distress.  No crackles, wheezes, or rales. GI: soft, non-tender. No guarding, rebound, or tenderness. + bowel sounds  Neuro: cranial nerves 2-12 intact, no abnormalities of strength or sensation, A and O x 3 MSK: Patient moves all 4 extremities symmetrically, no deformity, edema, or injury noted Psych: no abnormality of mood   ED Course  Procedures (including critical care time)   Date: 02/28/2011  Rate: 95  Rhythm: normal sinus rhythm  QRS Axis: normal  Intervals: normal  ST/T Wave abnormalities: nonspecific T wave changes  Conduction Disutrbances:none  Narrative Interpretation:   Old EKG Reviewed: unchanged   Labs Reviewed  COMPREHENSIVE METABOLIC PANEL - Abnormal; Notable for the following:    Potassium 5.6 (*) SLIGHT HEMOLYSIS   Glucose, Bld 120 (*)    Creatinine, Ser 1.53 (*)    AST 72 (*) SLIGHT HEMOLYSIS   ALT 96 (*)    GFR calc non Af Amer 50 (*)    GFR calc Af Amer 58 (*)    All other components within normal limits  URINALYSIS, ROUTINE W REFLEX MICROSCOPIC - Abnormal; Notable for the following:    Color, Urine ORANGE (*) BIOCHEMICALS MAY BE AFFECTED BY COLOR   APPearance TURBID (*)    Bilirubin Urine SMALL (*)    Ketones, ur TRACE (*)    Protein, ur >300 (*)    Leukocytes, UA SMALL (*)    All other components within normal limits  PRO B NATRIURETIC PEPTIDE - Abnormal; Notable for the following:    Pro B Natriuretic peptide (BNP) 368.2 (*)    All other components within normal limits  GLUCOSE, CAPILLARY - Abnormal; Notable for the following:    Glucose-Capillary 124 (*)    All other components within normal limits  URINE MICROSCOPIC-ADD ON - Abnormal; Notable for the following:    Squamous Epithelial / LPF FEW (*)    Bacteria, UA FEW (*)    Casts GRANULAR CAST (*) HYALINE CASTS   All other components within normal limits  CBC  TROPONIN I  DIFFERENTIAL  MAGNESIUM  POTASSIUM  POCT I-STAT TROPONIN I   Dg  Chest 2 View  02/28/2011  *RADIOLOGY REPORT*  Clinical Data: Dizziness.  Smoker with history of CHF.  CHEST - 2 VIEW 02/28/2011:  Comparison: Portable chest x-ray 10/17/2009, 05/20/2009 and two- view chest x-ray 05/15/2009 [facility].  Findings: Cardiac silhouette enlarged but stable.  Hilar and mediastinal contours otherwise unremarkable.  Lungs clear. Bronchovascular markings normal.  No pleural effusions.  Mild degenerative changes involving the thoracic spine.  IMPRESSION: Stable cardiomegaly.  No  acute cardiopulmonary disease.  Original Report Authenticated By: Arnell Sieving, M.D.   Ct Head Wo Contrast  02/28/2011  *RADIOLOGY REPORT*  Clinical Data: Weakness and dizziness.  Acute left-sided facial pain.  Nausea and diaphoresis.  CT HEAD WITHOUT CONTRAST  Technique:  Contiguous axial images were obtained from the base of the skull through the vertex without contrast.  Comparison: CT head without contrast 06/10/2009.  Findings:  No acute intracranial abnormality is present.  Specifically, there is no evidence for acute infarct, hemorrhage, mass, hydrocephalus, or extra-axial fluid collection.  The paranasal sinuses and mastoid air cells are clear.  The globes and orbits are intact.  The osseous skull is intact.  IMPRESSION: Negative CT of the head.  Original Report Authenticated By: Jamesetta Orleans. MATTERN, M.D.     1. Near syncope       MDM  Patient was evaluated and was hemodynamically stable with no neurologic complaints at this time.  Patient had work-up for near-syncope with only a K of 5.6 as his abnormal finding.  ECG showed no changes associated with hyperkalemia and patient had this repeated with normal result.  Patient had negative work-up including negative CT head as well as negative troponin at 0 and 3 hours. Patient and I discussed the findings and he felt at that time that his symptoms may have just been due to gas.  He was given simethicone and discharged in good  condition with instructions to follow-up with his regular doctor.        Cyndra Numbers, MD 03/02/11 1134

## 2011-02-28 NOTE — Discharge Instructions (Signed)
Near-Syncope °Near-syncope is sudden weakness, dizziness, or feeling like you might pass out (faint). This may occur when getting up after sitting or while standing for a long period of time. Near-syncope can be caused by a drop in blood pressure. This is a common reaction, but it may occur to a greater degree in people taking medicines to control their blood pressure. Fainting often occurs when the blood pressure or pulse is too low to provide enough blood flow to the brain to keep you conscious. Fainting and near-syncope are not usually due to serious medical problems. However, certain people should be more cautious in the event of near-syncope, including elderly patients, patients with diabetes, and patients with a history of heart conditions (especially irregular rhythms).  °CAUSES  °· Drop in blood pressure.  °· Physical pain.  °· Dehydration.  °· Heat exhaustion.  °· Emotional distress.  °· Low blood sugar.  °· Internal bleeding.  °· Heart and circulatory problems.  °· Infections.  °SYMPTOMS  °· Dizziness.  °· Feeling sick to your stomach (nauseous).  °· Nearly fainting.  °· Body numbness.  °· Turning pale.  °· Tunnel vision.  °· Weakness.  °HOME CARE INSTRUCTIONS  °· Lie down right away if you start feeling like you might faint. Breathe deeply and steadily. Wait until all the symptoms have passed. Most of these episodes last only a few minutes. You may feel tired for several hours.  °· Drink enough fluids to keep your urine clear or pale yellow.  °· If you are taking blood pressure or heart medicine, get up slowly, taking several minutes to sit and then stand. This can reduce dizziness that is caused by a drop in blood pressure.  °SEEK IMMEDIATE MEDICAL CARE IF:  °· You have a severe headache.  °· Unusual pain develops in the chest, abdomen, or back.  °· There is bleeding from the mouth or rectum, or you have black or tarry stool.  °· An irregular heartbeat or a very rapid pulse develops.  °· You have  repeated fainting or seizure-like jerking during an episode.  °· You faint when sitting or lying down.  °· You develop confusion.  °· You have difficulty walking.  °· Severe weakness develops.  °· Vision problems develop.  °MAKE SURE YOU:  °· Understand these instructions.  °· Will watch your condition.  °· Will get help right away if you are not doing well or get worse.  °Document Released: 12/26/2004 Document Revised: 09/07/2010 Document Reviewed: 02/11/2010 °Exit[employer]® Patient Information ©2012 [employer]. °

## 2011-02-28 NOTE — ED Notes (Signed)
Pt is still unable to give urine sample at this time 

## 2011-02-28 NOTE — Patient Instructions (Signed)
Increase Bidil to 1/2 tablet three times a day.  Increase lisinopril to 10mg  two times a day.  Start Lovaza 2G two times a day. This will be two 1G capsules two times a day.  Your physician recommends that you have lab work today--BMET/BNP/MAgnesium level.  Your physician recommends that you return for lab work in: 2 weeks--BMET   Your physician has recommended that you have a sleep study. This test records several body functions during sleep, including: brain activity, eye movement, oxygen and carbon dioxide blood levels, heart rate and rhythm, breathing rate and rhythm, the flow of air through your mouth and nose, snoring, body muscle movements, and chest and belly movement. Reschedule the appointment for the sleep study.  Your physician recommends that you schedule a follow-up appointment in: 1 month with Dr Shirlee Latch

## 2011-03-01 ENCOUNTER — Telehealth: Payer: Self-pay | Admitting: *Deleted

## 2011-03-01 ENCOUNTER — Other Ambulatory Visit: Payer: Self-pay | Admitting: *Deleted

## 2011-03-01 DIAGNOSIS — N179 Acute kidney failure, unspecified: Secondary | ICD-10-CM

## 2011-03-01 DIAGNOSIS — I5023 Acute on chronic systolic (congestive) heart failure: Secondary | ICD-10-CM

## 2011-03-01 LAB — BRAIN NATRIURETIC PEPTIDE: Pro B Natriuretic peptide (BNP): 47 pg/mL (ref 0.0–100.0)

## 2011-03-01 NOTE — Telephone Encounter (Signed)
Message copied by Theda Belfast on Wed Mar 01, 2011 11:46 AM ------      Message from: Laurey Morale      Created: Wed Mar 01, 2011  9:57 AM       Peggye Form was in the ER yesterday with K 5.6 and elevated creatinine.  Please call him ASAP and tell him to stop supplemental K and decrease lisinopril to 5 mg bid.  He needs a BMET on Friday morning.

## 2011-03-01 NOTE — Progress Notes (Signed)
Pt was notified.  He will stop his K+, not increase lisinopril and will come in on Friday for repeat bmet.

## 2011-03-01 NOTE — Progress Notes (Signed)
Patient's labs showed K 5.6 and creatinine 1.5.  He will need to stop supplemental K.  He should not increase lisinopril to 10 mg bid (keep at 5 mg bid).  Needs BMET on Friday.

## 2011-03-01 NOTE — Telephone Encounter (Signed)
I spoke with pt after receiving Dr. Alford Highland message. Pt will stop K supplement and decrease Lisinopril to 5mg .  Bmp ordered for 03/03/11. Mylo Red RN

## 2011-03-01 NOTE — Assessment & Plan Note (Signed)
Will schedule sleep study

## 2011-03-01 NOTE — Progress Notes (Signed)
PCP: Dr. Julio Sicks  56 yo with history of nonischemic cardiomyopathy presents for cardiology followup. Mr. Spratlin had a prolonged admission in 5/11 for CHF. Echo showed EF 15-20% with diffuse hypokinesis. Left and right heart cath showed no angiographic CAD and Fick CI 1.47. Patient became hypotensive with ARF (cardiogenic shock) requiring milrinone and dopamine. He was gradually titrated off these medications and begun on an oral regimen.  He saw Dr. Ladona Ridgel regarding an ICD but actually at that time admitted to resuming ETOH intake. Therefore, no ICD at this time.  He continues to drink 2 vodkas a night.    Mr Zeien missed his CHF clinic appt.  Weight is down 8 lbs since last appointment and he has been more compliant with his medication regimen.  He is still short of breath after walking 50-100 feet or walking up a flight of steps.  He chronically sleeps on 2 large pillows.  No chest pain.  He has been having a lot of cramping lately and does not think he is taking the right dose of potassium.  He never scheduled his sleep study.   He is still smoking.    Labs (5/11): K 4.4, creatinine 1.23, LDL 104, HDL 33  Labs (6/11): BNP 97, K 4.8, creatinine 1.4  Labs (7/11): K 4.8, creatinine 1.0, BNP 53  Labs (10/11): BNP 25, K 4.8 => 4.3, creatinine 1.4 => 1.3  Labs (12/11): BNP 25, K 4.9, creatinine 1.2  Labs (3/12): K 4.3, creatinine 1.2 Labs (11/12): K 4, creatinine 1.3, BNP 82, TGs 518, HDL 39, HDL 60  Allergies (verified):  1) ! Penicillin   Past Medical History:  1. Nonischemic Cardiomyopathy: Admitted 5/11 with acute systolic CHF. LHC showed no angiographic CAD. Echo (5/11) showed EF 15-20% with diffuse severe hypokinesis, moderate MR. RHC showed PCWP 10, CI 1.47 (Fick). Cause of CMP thought to be heavy ETOH and cocaine. No ICD yet due to ongoing ETOH intake.  2. ETOH abuse 3. Cocaine abuse: Quit in 2011  4. Active smoker: 1/2 ppd  5. Obesity   Family History:  "Heart disease" in mother    Social History:  Works as [employer]. Smokes 1/2 ppd. 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 Heavy ETOH in the past, now back to drinking 1/2 pint daily.   Review of Systems  All systems reviewed and negative except as per HPI.   Current Outpatient Prescriptions  Medication Sig Dispense Refill  . albuterol (PROVENTIL HFA) 108 (90 BASE) MCG/ACT inhaler Inhale 2 puffs into the lungs every 4 (four) hours as needed. For shortness of breath      . aspirin (ASPIR-LOW) 81 MG EC tablet Take 81 mg by mouth daily.       . carvedilol (COREG) 12.5 MG tablet Take 12.5 mg by mouth 2 (two) times daily. Take 1 1/2 tabs      . famotidine (PEPCID) 20 MG tablet Take 20 mg by mouth 2 (two) times daily. For stomach/  out       . furosemide (LASIX) 40 MG tablet Take 40 mg by mouth daily.      . Multiple Vitamins-Minerals (MULTIVITAL) tablet Take 1 tablet by mouth daily.        . Omeprazole 20 MG TBEC Take by mouth. 1 cap two times a day/  out       . potassium chloride (MICRO-K) 10 MEQ CR capsule Take 10 mEq by mouth daily.        [name] spironolactone (ALDACTONE) 25 MG tablet Take  25 mg by mouth daily.        . isosorbide-hydrALAZINE (BIDIL) 20-37.5 MG per tablet 1/2 tablet three times a day  45 tablet  6  . lisinopril (PRINIVIL,ZESTRIL) 10 MG tablet Take 1 tablet (10 mg total) by mouth 2 (two) times daily.  60 tablet  6  . magnesium oxide (MAG-OX 400) 400 MG tablet Take 1 tablet (400 mg total) by mouth daily.      [name] omega-3 acid ethyl esters (LOVAZA) 1 G capsule Take 2 capsules (2 g total) by mouth 2 (two) times daily.  120 capsule  6   No current facility-administered medications for this visit.   Facility-Administered Medications Ordered in Other Visits  Medication Dose Route Frequency Provider Last Rate Last Dose  . alum & mag hydroxide-simeth (MAALOX/MYLANTA) 200-200-20 MG/5ML suspension 30 mL  30 mL Oral Once Cyndra Numbers, MD   30 mL at 02/28/11 1715    BP 122/70  Pulse 76  Ht 5\' 7"  (1.702 m)  Wt 244 lb  (110.678 kg)  BMI 38.22 kg/m2  SpO2 97% General: NAD, obese Neck: Thick, JVP difficult, no thyromegaly or thyroid nodule.  Lungs: Clear to auscultation bilaterally with normal respiratory effort. CV: Nondisplaced PMI.  Heart regular S1/S2, no S3/S4, no murmur.  No peripheral edema.  No carotid bruit.  Normal pedal pulses.  Abdomen: Soft, nontender, no hepatosplenomegaly, no distention.  Neurologic: Alert and oriented x 3.  Psych: Normal affect. Extremities: No clubbing or cyanosis.

## 2011-03-01 NOTE — Assessment & Plan Note (Signed)
Needs abstinence from ETOH.  His cardiomyopathy may be related to ETOH.

## 2011-03-01 NOTE — Assessment & Plan Note (Signed)
Volume appears better today.  He still has NYHA class III symptoms.   - Increase Bidil to tid (will keep dose at 1/2 tab as he gets a mild headache with it.  - Increase lisinopril to 10 mg bid.   - Continue current Coreg and spironolactone doses.  - Continue current Lasix dose.   - BMET/BNP/Mg today and in 2 wks.

## 2011-03-01 NOTE — Assessment & Plan Note (Signed)
I counselled him to quit smoking.

## 2011-03-03 ENCOUNTER — Other Ambulatory Visit (INDEPENDENT_AMBULATORY_CARE_PROVIDER_SITE_OTHER): Payer: Medicaid Other

## 2011-03-03 DIAGNOSIS — I5022 Chronic systolic (congestive) heart failure: Secondary | ICD-10-CM

## 2011-03-03 DIAGNOSIS — I5023 Acute on chronic systolic (congestive) heart failure: Secondary | ICD-10-CM

## 2011-03-03 DIAGNOSIS — N179 Acute kidney failure, unspecified: Secondary | ICD-10-CM

## 2011-03-03 LAB — BASIC METABOLIC PANEL
BUN: 19 mg/dL (ref 6–23)
Chloride: 99 mEq/L (ref 96–112)
Potassium: 4.4 mEq/L (ref 3.5–5.1)

## 2011-03-14 ENCOUNTER — Ambulatory Visit (INDEPENDENT_AMBULATORY_CARE_PROVIDER_SITE_OTHER): Payer: Medicaid Other | Admitting: *Deleted

## 2011-03-14 DIAGNOSIS — I10 Essential (primary) hypertension: Secondary | ICD-10-CM

## 2011-03-14 LAB — BASIC METABOLIC PANEL
CO2: 27 mEq/L (ref 19–32)
Calcium: 9.3 mg/dL (ref 8.4–10.5)
Glucose, Bld: 118 mg/dL — ABNORMAL HIGH (ref 70–99)
Sodium: 133 mEq/L — ABNORMAL LOW (ref 135–145)

## 2011-04-06 ENCOUNTER — Ambulatory Visit: Payer: Medicaid Other | Admitting: Cardiology

## 2011-04-12 ENCOUNTER — Emergency Department (HOSPITAL_COMMUNITY): Payer: Medicaid Other

## 2011-04-12 ENCOUNTER — Other Ambulatory Visit: Payer: Self-pay

## 2011-04-12 ENCOUNTER — Emergency Department (HOSPITAL_COMMUNITY)
Admission: EM | Admit: 2011-04-12 | Discharge: 2011-04-12 | Disposition: A | Discharge status: Home / Self Care | Payer: Medicaid Other | Attending: Emergency Medicine | Admitting: Emergency Medicine

## 2011-04-12 ENCOUNTER — Encounter (HOSPITAL_COMMUNITY): Payer: Self-pay | Admitting: Emergency Medicine

## 2011-04-12 DIAGNOSIS — R05 Cough: Secondary | ICD-10-CM | POA: Insufficient documentation

## 2011-04-12 DIAGNOSIS — Z7982 Long term (current) use of aspirin: Secondary | ICD-10-CM | POA: Insufficient documentation

## 2011-04-12 DIAGNOSIS — R1110 Vomiting, unspecified: Secondary | ICD-10-CM | POA: Insufficient documentation

## 2011-04-12 DIAGNOSIS — Z8639 Personal history of other endocrine, nutritional and metabolic disease: Secondary | ICD-10-CM | POA: Insufficient documentation

## 2011-04-12 DIAGNOSIS — Z79899 Other long term (current) drug therapy: Secondary | ICD-10-CM | POA: Insufficient documentation

## 2011-04-12 DIAGNOSIS — R0602 Shortness of breath: Secondary | ICD-10-CM | POA: Insufficient documentation

## 2011-04-12 DIAGNOSIS — I509 Heart failure, unspecified: Secondary | ICD-10-CM | POA: Insufficient documentation

## 2011-04-12 DIAGNOSIS — K219 Gastro-esophageal reflux disease without esophagitis: Secondary | ICD-10-CM | POA: Insufficient documentation

## 2011-04-12 DIAGNOSIS — R079 Chest pain, unspecified: Secondary | ICD-10-CM | POA: Insufficient documentation

## 2011-04-12 DIAGNOSIS — R059 Cough, unspecified: Secondary | ICD-10-CM | POA: Insufficient documentation

## 2011-04-12 DIAGNOSIS — I10 Essential (primary) hypertension: Secondary | ICD-10-CM | POA: Insufficient documentation

## 2011-04-12 DIAGNOSIS — R002 Palpitations: Secondary | ICD-10-CM | POA: Insufficient documentation

## 2011-04-12 DIAGNOSIS — F17200 Nicotine dependence, unspecified, uncomplicated: Secondary | ICD-10-CM | POA: Insufficient documentation

## 2011-04-12 DIAGNOSIS — Z862 Personal history of diseases of the blood and blood-forming organs and certain disorders involving the immune mechanism: Secondary | ICD-10-CM | POA: Insufficient documentation

## 2011-04-12 LAB — BASIC METABOLIC PANEL
BUN: 17 mg/dL (ref 6–23)
CO2: 23 mEq/L (ref 19–32)
Calcium: 9.3 mg/dL (ref 8.4–10.5)
Chloride: 99 mEq/L (ref 96–112)
Creatinine, Ser: 0.95 mg/dL (ref 0.50–1.35)
GFR calc Af Amer: >90 mL/min (ref >90)
GFR calc non Af Amer: >90 mL/min (ref >90)
Glucose, Bld: 112 mg/dL — ABNORMAL HIGH (ref 70–99)
Potassium: 4.4 mEq/L (ref 3.5–5.1)
Sodium: 136 mEq/L (ref 135–145)

## 2011-04-12 LAB — MAGNESIUM: Magnesium: 2.1 mg/dL (ref 1.5–2.5)

## 2011-04-12 LAB — RAPID URINE DRUG SCREEN, HOSP PERFORMED
Barbiturates: NOT DETECTED
Tetrahydrocannabinol: POSITIVE — AB

## 2011-04-12 LAB — PRO B NATRIURETIC PEPTIDE: Pro B Natriuretic peptide (BNP): 185.4 pg/mL — ABNORMAL HIGH (ref 0–125)

## 2011-04-12 LAB — CBC
HCT: 40.8 % (ref 39.0–52.0)
Hemoglobin: 14.2 g/dL (ref 13.0–17.0)
MCH: 31.1 pg (ref 26.0–34.0)
MCHC: 34.8 g/dL (ref 30.0–36.0)
MCV: 89.3 fL (ref 78.0–100.0)
Platelets: 280 10*3/uL (ref 150–400)
RBC: 4.57 MIL/uL (ref 4.22–5.81)
RDW: 14.2 % (ref 11.5–15.5)
WBC: 9.5 10*3/uL (ref 4.0–10.5)

## 2011-04-12 LAB — CK TOTAL AND CKMB (NOT AT ARMC)
CK, MB: 3.2 ng/mL (ref 0.3–4.0)
Relative Index: 1.3 (ref 0.0–2.5)
Total CK: 254 U/L — ABNORMAL HIGH (ref 7–232)

## 2011-04-12 LAB — TROPONIN I
Troponin I: <0.3 ng/mL (ref <0.30)
Troponin I: <0.3 ng/mL (ref <0.30)

## 2011-04-12 LAB — ETHANOL: Alcohol, Ethyl (B): <11 mg/dL (ref 0–11)

## 2011-04-12 MED ORDER — ISOSORB DINITRATE-HYDRALAZINE 20-37.5 MG PO TABS
0.5000 | ORAL_TABLET | Freq: Once | ORAL | Status: AC
  Administered 2011-04-12: 06:00:00 via ORAL
  Filled 2011-04-12: qty 0.5

## 2011-04-12 MED ORDER — ASPIRIN 81 MG PO CHEW
324.0000 mg | CHEWABLE_TABLET | Freq: Once | ORAL | Status: AC
  Administered 2011-04-12: 324 mg via ORAL
  Filled 2011-04-12: qty 4

## 2011-04-12 MED ORDER — LISINOPRIL 10 MG PO TABS
10.0000 mg | ORAL_TABLET | Freq: Once | ORAL | Status: AC
  Administered 2011-04-12: 10 mg via ORAL
  Filled 2011-04-12: qty 1

## 2011-04-12 MED ORDER — CARVEDILOL 12.5 MG PO TABS
12.5000 mg | ORAL_TABLET | Freq: Once | ORAL | Status: AC
  Administered 2011-04-12: 12.5 mg via ORAL
  Filled 2011-04-12: qty 1

## 2011-04-12 NOTE — Discharge Instructions (Signed)
Palpitations  A palpitation is the feeling that your heartbeat is irregular or is faster than normal. Although this is frightening, it usually is not serious. Palpitations may be caused by excesses of smoking, caffeine, or alcohol. They are also brought on by stress and anxiety. Sometimes, they are caused by heart disease. Unless otherwise noted, your caregiver did not find any signs of serious illness at this time. HOME CARE INSTRUCTIONS  To help prevent palpitations:  Drink decaffeinated coffee, tea, and soda pop. Avoid chocolate.   If you smoke or drink alcohol, quit or cut down as much as possible.   Reduce your stress or anxiety level. Biofeedback, yoga, or meditation will help you relax. Physical activity such as swimming, jogging, or walking also may be helpful.  SEEK MEDICAL CARE IF:   You continue to have a fast heartbeat.   Your palpitations occur more often.  SEEK IMMEDIATE MEDICAL CARE IF: You develop chest pain, shortness of breath, severe headache, dizziness, or fainting. Document Released: 12/24/1999 Document Revised: 12/15/2010 Document Reviewed: 02/22/2007 Exit[employer] Patient Information 2012 [employer]. 

## 2011-04-12 NOTE — ED Notes (Signed)
PT. REPORTS INTERMITTENT LEFT CHEST PAIN / PALPITATIONS WITH SLIGHT SOB AND OCCASIONAL DRY COUGH AND VOMITTING ONSET 2 DAYS AGO.

## 2011-04-12 NOTE — Consult Note (Signed)
Cardiology IP Consult  Reason for Consult: palpitations Referring Physician: Juleen China  HPI: Ethan Wilcox is a 56 y.o.male with Nonischemic cardiomyopathy secondary to ETOH abuse presents to the ED with palpitations that began at rest last night.  The patient smells of ETOH and admits to drinking significantly last night.  He took some medication that ended in "PM" and shortly after developed palpitations. In the ED he had occasional episodes of bigeminy but no other significant arrhythmias.  He has continued to drink but states he is no longer using cocaine.  He last saw Dr. Shirlee Latch in February and was doing better at that time.  He had lost approximately 8 lbs.  He states he has not gained any more weight and his breathing is unchanged.  He has no orthopnea or PND.  He has not had any chest pain and he reports compliance with all of his medications. He now feels much better and is asking to go home.  His cardiac history is significant for an admission in 2011 with acute CHF and shock. He underwent LHC at that time and did not have significant CAD.    Past Medical History  Diagnosis Date  . Nonischemic cardiomyopathy     admitted 5/11 w acute systolic CHF. LHC showed no angiographic CAD. Echo (5/11) showed EF 15-20% w diffuse sever hypokinesis, moderate MR> RHC showed PCWP 10, CI 1.47 (Fick.) cause of CMP thought to be heavy ETOH and cocaine. No ICD yet due to ongoing ETOH intake  . ETOH abuse     quit drinking after 5/11 hospitalization  . Cocaine abuse   . Active smoker /    1/2 ppd  . Obesity   . Hypertension   . CHF (congestive heart failure)   . Gout   . GERD (gastroesophageal reflux disease)     History reviewed. No pertinent past surgical history.  Family History  Problem Relation Age of Onset  . Heart disease Mother     Social History:  reports that he has been smoking.  He does not have any smokeless tobacco history on file. He reports that he drinks alcohol. He reports that he uses  illicit drugs.  Allergies:  Allergies  Allergen Reactions  . Penicillins Other (See Comments)    unknow    Current Facility-Administered Medications  Medication Dose Route Frequency Provider Last Rate Last Dose  . aspirin chewable tablet 324 mg  324 mg Oral Once Raeford Razor, MD   324 mg at 04/12/11 0124  . carvedilol (COREG) tablet 12.5 mg  12.5 mg Oral Once Raeford Razor, MD      . isosorbide-hydrALAZINE (BIDIL) 20-37.5 MG per tablet 0.5 tablet  0.5 tablet Oral Once Raeford Razor, MD      . lisinopril (PRINIVIL,ZESTRIL) tablet 10 mg  10 mg Oral Once Raeford Razor, MD       Current Outpatient Prescriptions  Medication Sig Dispense Refill  . albuterol (PROVENTIL HFA) 108 (90 BASE) MCG/ACT inhaler Inhale 2 puffs into the lungs every 4 (four) hours as needed. For shortness of breath      . aspirin (ASPIR-LOW) 81 MG EC tablet Take 81 mg by mouth daily.       . carvedilol (COREG) 12.5 MG tablet Take 12.5 mg by mouth 2 (two) times daily.       . famotidine (PEPCID) 20 MG tablet Take 20 mg by mouth 2 (two) times daily. For stomach/  out       . furosemide (LASIX) 40 MG tablet  Take 40 mg by mouth daily.      . isosorbide-hydrALAZINE (BIDIL) 20-37.5 MG per tablet 1/2 tablet three times a day  45 tablet  6  . lisinopril (PRINIVIL,ZESTRIL) 10 MG tablet Take 1 tablet (10 mg total) by mouth 2 (two) times daily.  60 tablet  6  . magnesium oxide (MAG-OX 400) 400 MG tablet Take 1 tablet (400 mg total) by mouth daily.      . Multiple Vitamins-Minerals (MULTIVITAL) tablet Take 1 tablet by mouth daily.        [name] omega-3 acid ethyl esters (LOVAZA) 1 G capsule Take 2 capsules (2 g total) by mouth 2 (two) times daily.  120 capsule  6  . Omeprazole 20 MG TBEC Take by mouth. 1 cap two times a day/  out       . potassium chloride (MICRO-K) 10 MEQ CR capsule Take 10 mEq by mouth daily.        [name] spironolactone (ALDACTONE) 25 MG tablet Take 25 mg by mouth daily.          ROS: A full review of systems is  obtained and is negative except as noted in the HPI.  Physical Exam: Blood pressure 123/101, pulse 96, temperature 97.9 F (36.6 C), temperature source Oral, resp. rate 24, SpO2 99.00%.  GENERAL: no acute distress.  EYES: Extra ocular movements are intact. There is no lid lag. Sclera is anicteric.  ENT: Oropharynx is clear. Dentition is within normal limits.  NECK: Supple. The thyroid is not enlarged.  LYMPH: There are no masses or lymphadenopathy present.  HEART: Regular rate and rhythm with s4 present, no mumur, minimal JVD LUNGS: Clear to auscultation There are no rales, rhonchi, or wheezes.  ABDOMEN: Soft, non-tender, and non-distended with normoactive bowel sounds. Central obesity  EXTREMITIES: No clubbing, cyanosis, or edema.  PULSES: Carotids were +2 and equal bilaterally with no bruits. Femoral pulses were +2 and equal bilaterally. DP/PT pulses were +2 and equal bilaterally.  SKIN: Warm, dry, and intact.  NEUROLOGIC: The patient was oriented to person, place, and time. No overt neurologic deficits were detected.  PSYCH: Normal judgment and insight, mood is appropriate.    Results: Results for orders placed during the hospital encounter of 04/12/11 (from the past 24 hour(s))  CBC     Status: Normal   Collection Time   04/12/11 12:50 AM      Component Value Range   WBC 9.5  4.0 - 10.5 (K/uL)   RBC 4.57  4.22 - 5.81 (MIL/uL)   Hemoglobin 14.2  13.0 - 17.0 (g/dL)   HCT 16.1  09.6 - 04.5 (%)   MCV 89.3  78.0 - 100.0 (fL)   MCH 31.1  26.0 - 34.0 (pg)   MCHC 34.8  30.0 - 36.0 (g/dL)   RDW 40.9  81.1 - 91.4 (%)   Platelets 280  150 - 400 (K/uL)  BASIC METABOLIC PANEL     Status: Abnormal   Collection Time   04/12/11 12:50 AM      Component Value Range   Sodium 136  135 - 145 (mEq/L)   Potassium 4.4  3.5 - 5.1 (mEq/L)   Chloride 99  96 - 112 (mEq/L)   CO2 23  19 - 32 (mEq/L)   Glucose, Bld 112 (*) 70 - 99 (mg/dL)   BUN 17  6 - 23 (mg/dL)   Creatinine, Ser 7.82  0.50 - 1.35  (mg/dL)   Calcium 9.3  8.4 - 95.6 (mg/dL)  GFR calc non Af Amer >90  >90 (mL/min)   GFR calc Af Amer >90  >90 (mL/min)  TROPONIN I     Status: Normal   Collection Time   04/12/11 12:58 AM      Component Value Range   Troponin I <0.30  <0.30 (ng/mL)  TROPONIN I     Status: Normal   Collection Time   04/12/11  3:32 AM      Component Value Range   Troponin I <0.30  <0.30 (ng/mL)  MAGNESIUM     Status: Normal   Collection Time   04/12/11  3:32 AM      Component Value Range   Magnesium 2.1  1.5 - 2.5 (mg/dL)    CXR: clear, no pulm edema EKG: NSR without STT changes, PVC's Telemetry reviewed  Assessment/Plan: 56 yo AAM with nonischemic cardiomyopathy and polysubstance abuse here with brief palpitations in the setting of ETOH and and unknown over-the-counter pain/cold medication.  Pt feels better and is asking to go home.  1. Palpitations: possibly symptomatic PVC's that have now resolved.   - suspect exacerbated by ETOH and cold med - no evidence of significant arrhythmia on telemetry  2. Nonischemic CM: fairly well compensated - continue current meds - give AM medications now, check full cardiac biomarker profile (CKMB and trop) and BNP - check ETOH level and UDS  3. HTN: suspect OSA may be contributing to HTN - he was recently referred for sleep study, encouraged to followup for this  4. Disposition: assuming remaining labwork is unremarkable, will plan to d/c home from ED.  Pt has followup scheduled with Dr. Shirlee Latch in 1 month. Discussed with ED physician Dr. Juleen China.    Ethan Wilcox 04/12/2011, 5:30 AM

## 2011-04-12 NOTE — ED Notes (Signed)
Pt skin dark, warm, moist; sitting upright on stretcher stating "I just don't feel good"; ccm showing SR rate 94 with runs of bijeminy, trigeminy and couplets; placed on Zoll

## 2011-04-12 NOTE — ED Notes (Signed)
PT. SEEN AND EXAMINED BY CARDIOLOGIST, ADVISED PT. THAT HE WILL SEE HIM IN HIS OFFICE.

## 2011-04-12 NOTE — ED Notes (Signed)
Patient transported to X-ray 

## 2011-04-12 NOTE — ED Provider Notes (Signed)
History    56 year old male with palpitations. Gradual onset about 2 days ago. Intermittent. Sometimes associated with chest pain. Mild shortness of breath. Nonproductive cough. No fevers or chills. No unusual leg pain or swelling. Patient with significant heart history. Patient also has a history of cocaine abuse, but currently denies. Reports compliance with his medications. CSN: [id number]  Arrival date & time 04/12/11  4098   First MD Initiated Contact with Patient 04/12/11 [phone]      Chief Complaint  Patient presents with  . Chest Pain    (Consider location/radiation/quality/duration/timing/severity/associated sxs/prior treatment) HPI  Past Medical History  Diagnosis Date  . Nonischemic cardiomyopathy     admitted 5/11 w acute systolic CHF. LHC showed no angiographic CAD. Echo (5/11) showed EF 15-20% w diffuse sever hypokinesis, moderate MR> RHC showed PCWP 10, CI 1.47 (Fick.) cause of CMP thought to be heavy ETOH and cocaine. No ICD yet due to ongoing ETOH intake  . ETOH abuse     quit drinking after 5/11 hospitalization  . Cocaine abuse   . Active smoker /    1/2 ppd  . Obesity   . Hypertension   . CHF (congestive heart failure)   . Gout   . GERD (gastroesophageal reflux disease)     History reviewed. No pertinent past surgical history.  Family History  Problem Relation Age of Onset  . Heart disease Mother     History  Substance Use Topics  . Smoking status: Current Everyday Smoker -- 0.5 packs/day  . Smokeless tobacco: Not on file  . Alcohol Use: Yes      Review of Systems   Review of symptoms negative unless otherwise noted in HPI.   Allergies  Penicillins  Home Medications   Current Outpatient Rx  Name Route Sig Dispense Refill  . ALBUTEROL SULFATE HFA 108 (90 BASE) MCG/ACT IN AERS Inhalation Inhale 2 puffs into the lungs every 4 (four) hours as needed. For shortness of breath    . ASPIRIN 81 MG PO TBEC Oral Take 81 mg by mouth daily.     [name]  CARVEDILOL 12.5 MG PO TABS Oral Take 12.5 mg by mouth 2 (two) times daily.     [name] FAMOTIDINE 20 MG PO TABS Oral Take 20 mg by mouth 2 (two) times daily. For stomach/  out     . FUROSEMIDE 40 MG PO TABS Oral Take 40 mg by mouth daily.    . ISOSORB DINITRATE-HYDRALAZINE 20-37.5 MG PO TABS  1/2 tablet three times a day 45 tablet 6  . LISINOPRIL 10 MG PO TABS Oral Take 1 tablet (10 mg total) by mouth 2 (two) times daily. 60 tablet 6  . MAGNESIUM OXIDE 400 MG PO TABS Oral Take 1 tablet (400 mg total) by mouth daily.    . MULTIVITAL PO TABS Oral Take 1 tablet by mouth daily.      . OMEGA-3-ACID ETHYL ESTERS 1 G PO CAPS Oral Take 2 capsules (2 g total) by mouth 2 (two) times daily. 120 capsule 6  . OMEPRAZOLE 20 MG PO TBEC Oral Take by mouth. 1 cap two times a day/  out     . POTASSIUM CHLORIDE 10 MEQ PO CPCR Oral Take 10 mEq by mouth daily.      [name] SPIRONOLACTONE 25 MG PO TABS Oral Take 25 mg by mouth daily.        BP 123/101  Pulse 96  Temp(Src) 97.9 F (36.6 C) (Oral)  Resp 24  SpO2 99%  Physical Exam  Nursing note and vitals reviewed. Constitutional: He appears well-developed and well-nourished. No distress.  HENT:  Head: Normocephalic and atraumatic.  Eyes: Conjunctivae are normal. Right eye exhibits no discharge. Left eye exhibits no discharge.  Neck: Neck supple.  Cardiovascular: Normal rate, regular rhythm and normal heart sounds.  Exam reveals no gallop and no friction rub.   No murmur heard. Pulmonary/Chest: Effort normal and breath sounds normal. No respiratory distress.  Abdominal: Soft. He exhibits no distension. There is no tenderness.  Musculoskeletal: He exhibits no edema and no tenderness.  Neurological: He is alert.  Skin: Skin is warm and dry.  Psychiatric: He has a normal mood and affect. His behavior is normal. Thought content normal.    ED Course  Procedures (including critical care time)  Labs Reviewed  BASIC METABOLIC PANEL - Abnormal; Notable for the  following:    Glucose, Bld 112 (*)    All other components within normal limits  CK TOTAL AND CKMB - Abnormal; Notable for the following:    Total CK 254 (*)    All other components within normal limits  PRO B NATRIURETIC PEPTIDE - Abnormal; Notable for the following:    Pro B Natriuretic peptide (BNP) 185.4 (*)    All other components within normal limits  URINE RAPID DRUG SCREEN (HOSP PERFORMED) - Abnormal; Notable for the following:    Tetrahydrocannabinol POSITIVE (*)    All other components within normal limits  CBC  TROPONIN I  TROPONIN I  MAGNESIUM  ETHANOL  LAB REPORT - SCANNED   Dg Chest 2 View  04/12/2011  *RADIOLOGY REPORT*  Clinical Data: Intermittent left chest pain and palpitations. Shortness of breath and dry cough.  Vomiting.  Onset 2 days ago.  CHEST - 2 VIEW  Comparison: 02/28/2011  Findings: Cardiac enlargement with normal pulmonary vascularity similar previous study.  No focal airspace consolidation in the lungs.  No blunting of costophrenic angles.  No pneumothorax.  Mild degenerative changes in the spine.  No significant change since previous study.  IMPRESSION: No evidence of active pulmonary disease.  Original Report Authenticated By: Marlon Pel, M.D.     1. Heart palpitations       MDM  56 year old male with palpitations and intermittent chest pain. EKG showed no acute ischemic changes. Troponin repeat troponin were normal. Consider ACS but doubt. Feel that patient is appropriate for discharge at this time. Outpatient followup.        Raeford Razor, MD 04/18/11 2248

## 2011-04-12 NOTE — ED Notes (Signed)
Patient transported from xray 

## 2011-04-14 ENCOUNTER — Other Ambulatory Visit: Payer: Self-pay | Admitting: *Deleted

## 2011-04-14 MED ORDER — FUROSEMIDE 40 MG PO TABS: 40.0000 mg | ORAL_TABLET | Freq: Every day | ORAL | Status: DC

## 2011-05-01 ENCOUNTER — Telehealth: Payer: Self-pay | Admitting: Cardiology

## 2011-05-01 DIAGNOSIS — I5022 Chronic systolic (congestive) heart failure: Secondary | ICD-10-CM

## 2011-05-01 MED ORDER — SPIRONOLACTONE 25 MG PO TABS: 25.0000 mg | ORAL_TABLET | Freq: Every day | ORAL | Status: DC

## 2011-05-01 MED ORDER — OMEPRAZOLE 20 MG PO TBEC: DELAYED_RELEASE_TABLET | ORAL | Status: DC

## 2011-05-01 MED ORDER — OMEGA-3-ACID ETHYL ESTERS 1 G PO CAPS: 2.0000 g | ORAL_CAPSULE | Freq: Two times a day (BID) | ORAL | Status: DC

## 2011-05-01 MED ORDER — LISINOPRIL 10 MG PO TABS: 10.0000 mg | ORAL_TABLET | Freq: Two times a day (BID) | ORAL | Status: DC

## 2011-05-01 NOTE — Telephone Encounter (Signed)
New Problem:     Patient called in needing a refill of his Omeprazole 20 MG TBEC, spironolactone (ALDACTONE) 25 MG tablet, lisinopril (PRINIVIL,ZESTRIL) 10 MG tablet and omega-3 acid ethyl esters (LOVAZA) 1 G capsule.

## 2011-05-03 ENCOUNTER — Telehealth: Payer: Self-pay | Admitting: *Deleted

## 2011-05-03 NOTE — Telephone Encounter (Signed)
Received authorization for lovaza 1G two bid from [facility] [place] prior authorization  at [phone] for 1 year. Pt's pharmacy [facility] [phone] notified.

## 2011-05-11 ENCOUNTER — Encounter: Payer: Self-pay | Admitting: Cardiology

## 2011-05-11 ENCOUNTER — Ambulatory Visit (INDEPENDENT_AMBULATORY_CARE_PROVIDER_SITE_OTHER): Payer: Medicaid Other | Admitting: Cardiology

## 2011-05-11 VITALS — BP 118/70 | HR 53 | Resp 18 | Ht 67.0 in | Wt 246.8 lb

## 2011-05-11 DIAGNOSIS — F17200 Nicotine dependence, unspecified, uncomplicated: Secondary | ICD-10-CM

## 2011-05-11 DIAGNOSIS — I5022 Chronic systolic (congestive) heart failure: Secondary | ICD-10-CM

## 2011-05-11 DIAGNOSIS — R5381 Other malaise: Secondary | ICD-10-CM

## 2011-05-11 DIAGNOSIS — G4733 Obstructive sleep apnea (adult) (pediatric): Secondary | ICD-10-CM

## 2011-05-11 DIAGNOSIS — F1010 Alcohol abuse, uncomplicated: Secondary | ICD-10-CM

## 2011-05-11 DIAGNOSIS — R002 Palpitations: Secondary | ICD-10-CM

## 2011-05-11 DIAGNOSIS — R5383 Other fatigue: Secondary | ICD-10-CM

## 2011-05-11 DIAGNOSIS — R0602 Shortness of breath: Secondary | ICD-10-CM

## 2011-05-11 LAB — BASIC METABOLIC PANEL
CO2: 27 mEq/L (ref 19–32)
Calcium: 9.8 mg/dL (ref 8.4–10.5)
Chloride: 100 mEq/L (ref 96–112)
Sodium: 138 mEq/L (ref 135–145)

## 2011-05-11 LAB — HEPATIC FUNCTION PANEL
ALT: 51 U/L (ref 0–53)
AST: 40 U/L — ABNORMAL HIGH (ref 0–37)
Bilirubin, Direct: 0.1 mg/dL (ref 0.0–0.3)
Total Bilirubin: 0.8 mg/dL (ref 0.3–1.2)

## 2011-05-11 LAB — BRAIN NATRIURETIC PEPTIDE: Pro B Natriuretic peptide (BNP): 33 pg/mL (ref 0.0–100.0)

## 2011-05-11 MED ORDER — OMEPRAZOLE 20 MG PO TBEC: DELAYED_RELEASE_TABLET | ORAL | Status: DC

## 2011-05-11 NOTE — Assessment & Plan Note (Signed)
Nonischemic cardiomyopathy, possibly ETOH-related.  He does not appear grossly volume overloaded.  He still has NYHA class III symptoms.   - Continue current doses of Coreg, lisinopril, Lasix, spironolactone, and Bidil.  He has not tolerated uptitration of Bidil due to headache.  - Not candidate for ICD until he can quit drinking.   - BMET/BNP today, followup in 2 months.

## 2011-05-11 NOTE — Progress Notes (Signed)
PCP: Dr. Julio Sicks  56 yo with history of nonischemic cardiomyopathy presents for cardiology followup. Ethan Wilcox had a prolonged admission in 5/11 for CHF. Echo showed EF 15-20% with diffuse hypokinesis. Left and right heart cath showed no angiographic CAD and Fick CI 1.47. Patient became hypotensive with ARF (cardiogenic shock) requiring milrinone and dopamine. He was gradually titrated off these medications and begun on an oral regimen.  He saw Dr. Ladona Ridgel regarding an ICD but actually at that time admitted to resuming ETOH intake. Therefore, no ICD at this time.  He continues to drink 2 vodkas a night.    Since I last saw him, he has been stable.  He is still short of breath after walking "long distances" or going up steps.  He fatigues easily.  He continues to snore loudly and is sleepy during the day.  He has not yet had a sleep study.  He went to the ER on 4/3 with palpitations.  Per the ER note, he had been drinking heavily that night and had taken some cold medicine.  In the ER, evaluation was negative and he was sent home.  He has had no further palpitations since that time.  I had asked him to cut back on lisinopril to 5 mg bid because of elevated creatinine but he thinks he is still taking 10 mg bid and his last creatinine was 0.95.  Weight is up 2 lbs compared to prior appointment.   Labs (5/11): K 4.4, creatinine 1.23, LDL 104, HDL 33  Labs (6/11): BNP 97, K 4.8, creatinine 1.4  Labs (7/11): K 4.8, creatinine 1.0, BNP 53  Labs (10/11): BNP 25, K 4.8 => 4.3, creatinine 1.4 => 1.3  Labs (12/11): BNP 25, K 4.9, creatinine 1.2  Labs (3/12): K 4.3, creatinine 1.2 Labs (11/12): K 4, creatinine 1.3, BNP 82, TGs 518, HDL 39, HDL 60 Labs (2/13): AST 72, ALT 96 Labs (4/13): K 4.4, creatinine 0.95, proBNP 185  Allergies (verified):  1) ! Penicillin   Past Medical History:  1. Nonischemic Cardiomyopathy: Admitted 5/11 with acute systolic CHF. LHC showed no angiographic CAD. Echo (5/11) showed EF  15-20% with diffuse severe hypokinesis, moderate MR. RHC showed PCWP 10, CI 1.47 (Fick). Cause of CMP thought to be heavy ETOH and cocaine. No ICD yet due to ongoing ETOH intake.  2. ETOH abuse 3. Cocaine abuse: Quit in 2011  4. Active smoker: 1/2 ppd  5. 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 Heavy ETOH in the past, now back to drinking 1/2 pint daily.    Current Outpatient Prescriptions  Medication Sig Dispense Refill  . albuterol (PROVENTIL HFA) 108 (90 BASE) MCG/ACT inhaler Inhale 2 puffs into the lungs every 4 (four) hours as needed. For shortness of breath      . aspirin (ASPIR-LOW) 81 MG EC tablet Take 81 mg by mouth daily.       . carvedilol (COREG) 12.5 MG tablet Take 12.5 mg by mouth 2 (two) times daily.       . famotidine (PEPCID) 20 MG tablet Take 20 mg by mouth 2 (two) times daily. For stomach/  out       . furosemide (LASIX) 40 MG tablet Take 1 tablet (40 mg total) by mouth daily.  30 tablet  12  . isosorbide-hydrALAZINE (BIDIL) 20-37.5 MG per tablet 1/2 tablet three times a day  45 tablet  6  . lisinopril (PRINIVIL,ZESTRIL) 10 MG tablet Take 1 tablet (10 mg total) by mouth 2 (two) times daily.  60 tablet  6  . magnesium oxide (MAG-OX 400) 400 MG tablet Take 1 tablet (400 mg total) by mouth daily.      . Multiple Vitamins-Minerals (MULTIVITAL) tablet Take 1 tablet by mouth daily.        [name] omega-3 acid ethyl esters (LOVAZA) 1 G capsule Take 2 capsules (2 g total) by mouth 2 (two) times daily.  120 capsule  6  . Omeprazole 20 MG TBEC Take 1 tablet by month twice daily  180 each  3  . spironolactone (ALDACTONE) 25 MG tablet Take 1 tablet (25 mg total) by mouth daily.  30 tablet  6  . DISCONTD: Omeprazole 20 MG TBEC Take 1 tablet by month twice daily  30 each  6    BP 118/70  Pulse 53  Resp 18  Ht 5\' 7"  (1.702 m)  Wt 246 lb 12.8 oz (111.948 kg)  BMI 38.65 kg/m2  SpO2  98% General: NAD, obese Neck: Thick, JVP difficult but does not appear elevated, no thyromegaly or thyroid nodule.  Lungs: Clear to auscultation bilaterally with normal respiratory effort. CV: Nondisplaced PMI.  Heart regular S1/S2, no S3/S4, no murmur.  No peripheral edema.  No carotid bruit.  Normal pedal pulses.  Abdomen: Soft, nontender, no hepatosplenomegaly, no distention.  Neurologic: Alert and oriented x 3.  Psych: Normal affect. Extremities: No clubbing or cyanosis.

## 2011-05-11 NOTE — Assessment & Plan Note (Signed)
I counselled him again to quit smoking.  

## 2011-05-11 NOTE — Assessment & Plan Note (Signed)
Needs abstinence from ETOH.  His cardiomyopathy may be related to ETOH.

## 2011-05-11 NOTE — Assessment & Plan Note (Signed)
Strongly suspect OSA.  Will arrange for sleep study.

## 2011-05-11 NOTE — Patient Instructions (Signed)
Your physician recommends that you have lab work today: BMP,BNP,lft.  We will call you with your results.  Your physician has recommended that you have a sleep study. This test records several body functions during sleep, including: brain activity, eye movement, oxygen and carbon dioxide blood levels, heart rate and rhythm, breathing rate and rhythm, the flow of air through your mouth and nose, snoring, body muscle movements, and chest and belly movement.  Your physician recommends that you schedule a follow-up appointment in: 2 months with Dr. Shirlee Latch

## 2011-05-20 IMAGING — CT CT HEAD W/O CM
1 of 2 series · 16 of 30 positions shown, 20 images · non-contrast
Comparison: CT 08/28/2006.

CLINICAL DATA: Rule out CVA.

CT HEAD WITHOUT CONTRAST
TECHNIQUE: Contiguous axial images were obtained from the base of
the skull through the vertex without contrast.

[Series 2: head routine 4.8 h37s · axial · 0.46mm/px · z∈[-152,+5]mm · 16 of 36 slices shown, 20 images]
[im 2/36  brain]
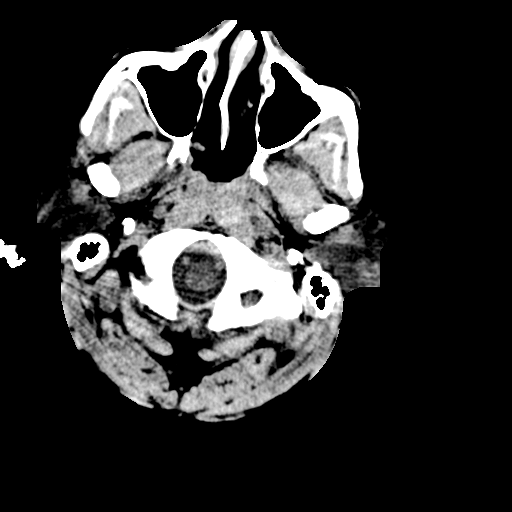
[im 2/36  bone]
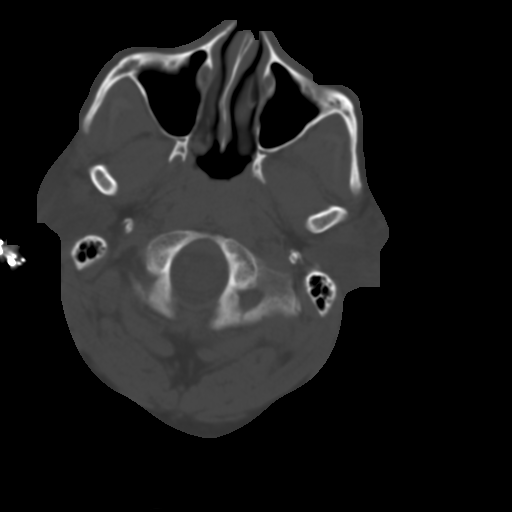
[im 4/36  brain]
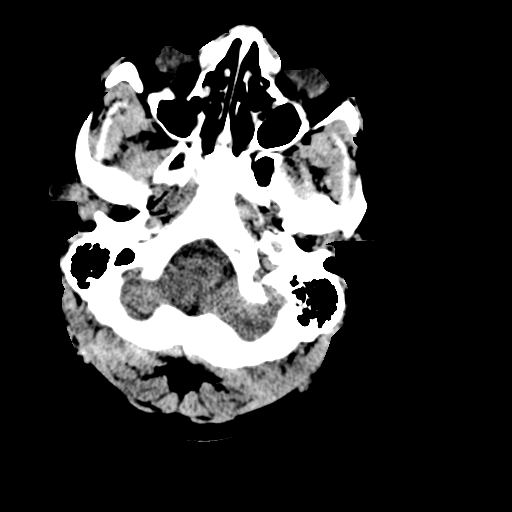
[im 7/36  brain]
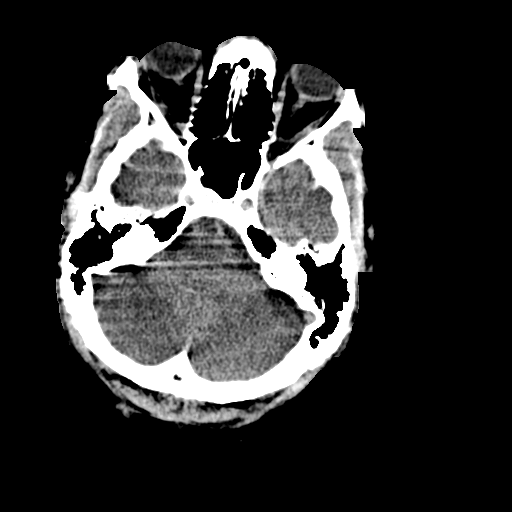
[im 9/36  brain]
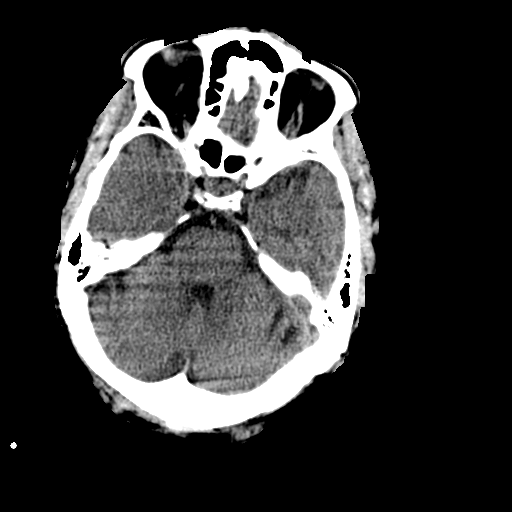
[im 11/36  brain]
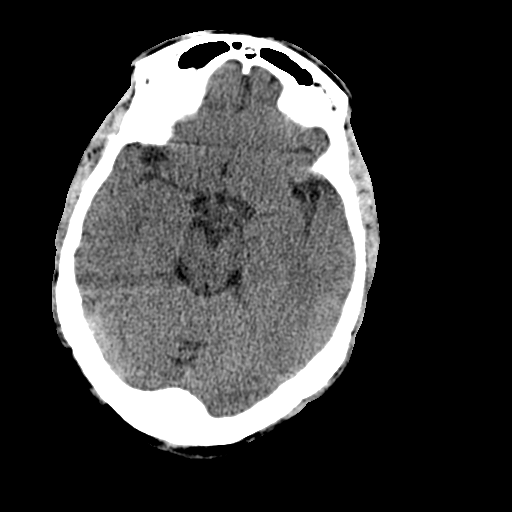
[im 11/36  bone]
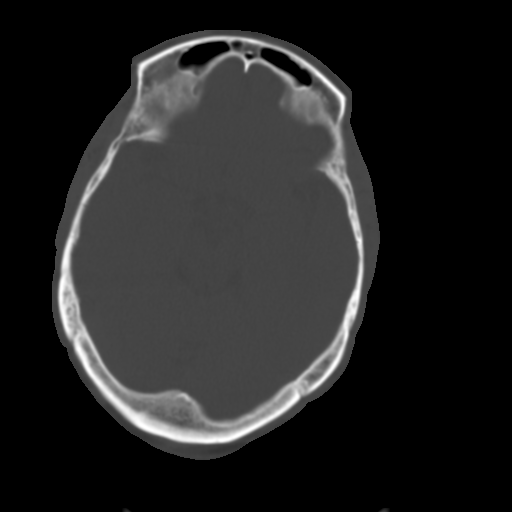
[im 12/36  brain]
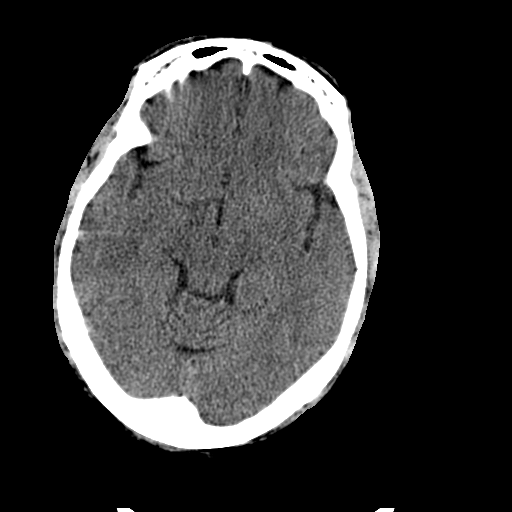
[im 16/36  brain]
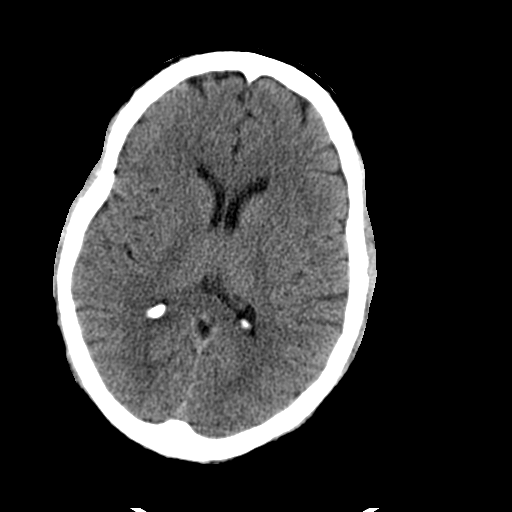
[im 17/36  brain]
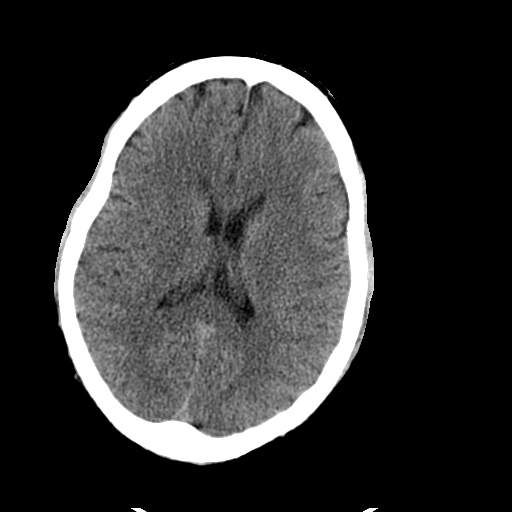
[im 19/36  brain]
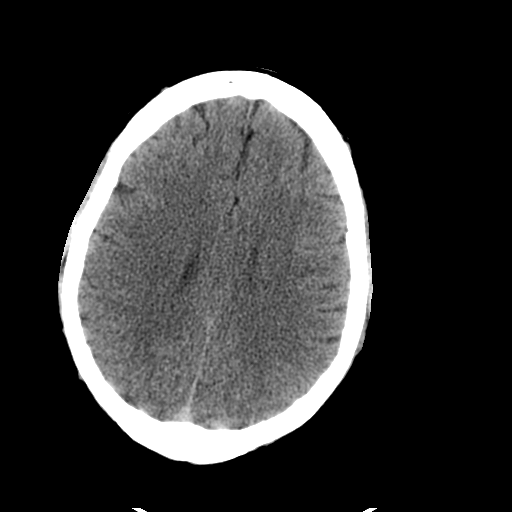
[im 19/36  bone]
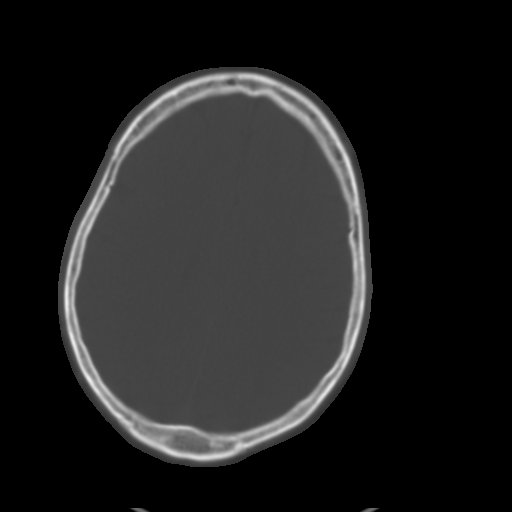
[im 21/36  brain]
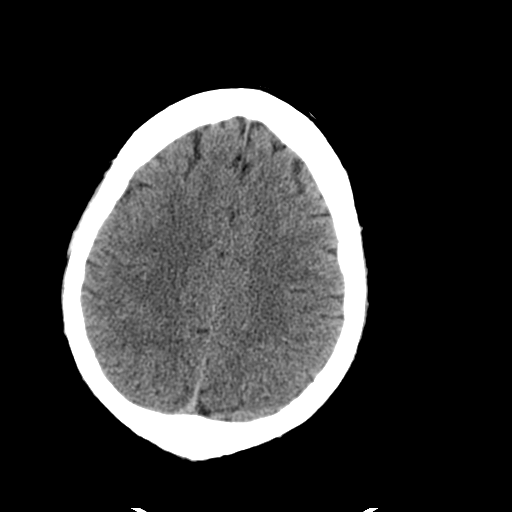
[im 24/36  brain]
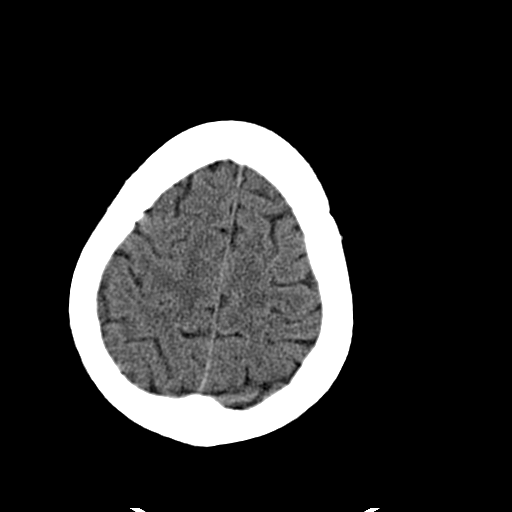
[im 26/36  brain]
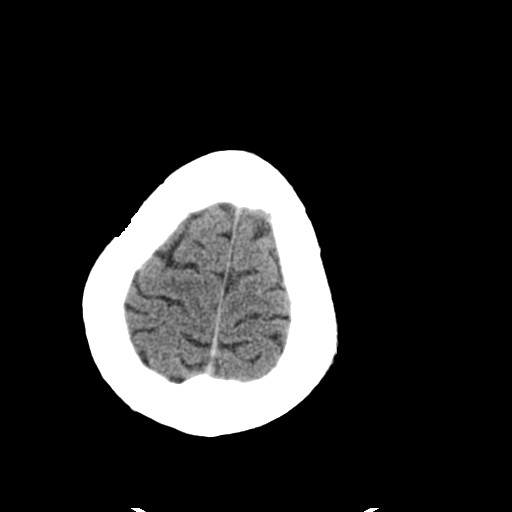
[im 27/36  brain]
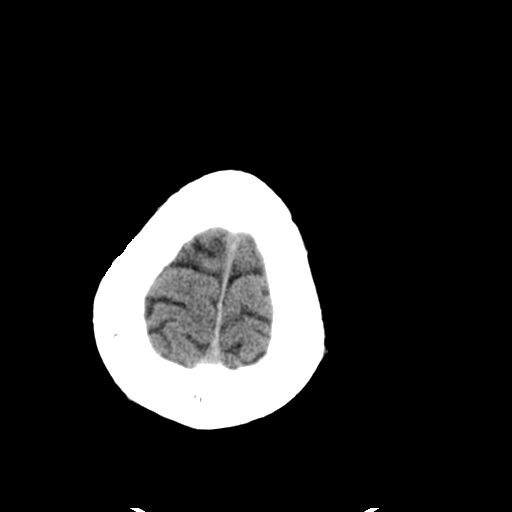
[im 27/36  bone]
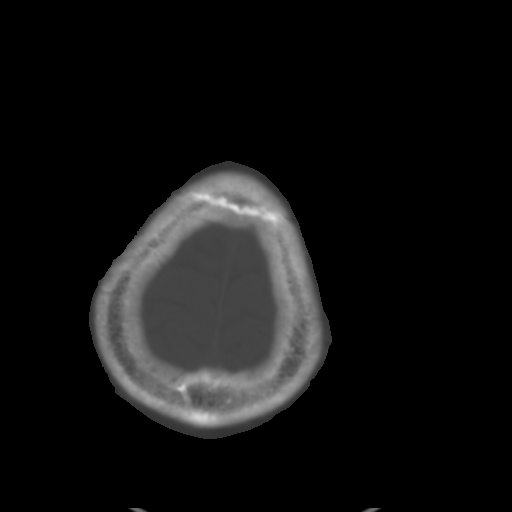
[im 29/36  brain]
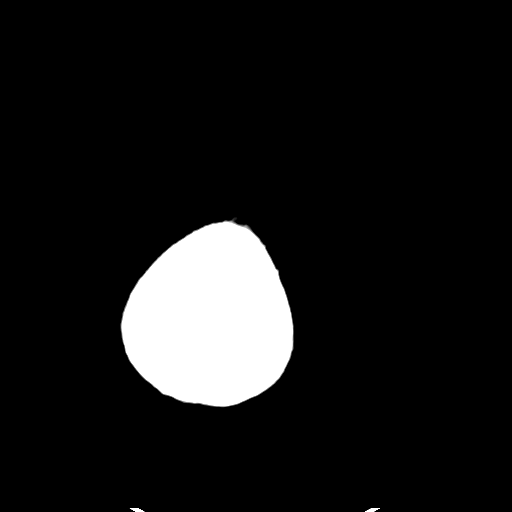
[im 32/36  brain]
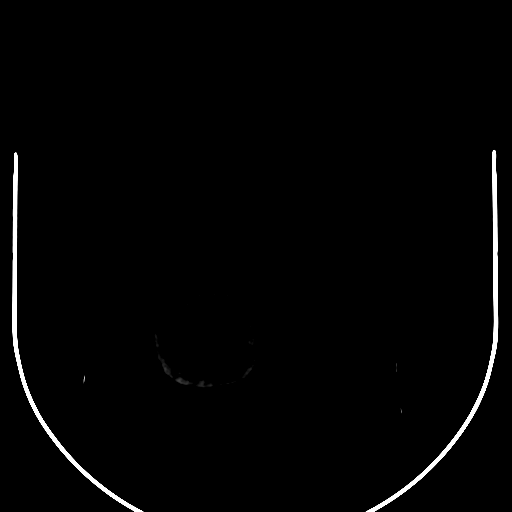
[im 34/36  brain]
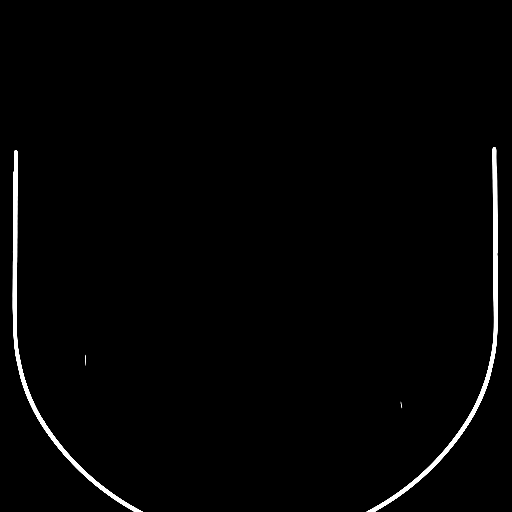

[16 of 30 positions shown; findings below may reference images not displayed]

FINDINGS: Mild motion degrades image quality.

Ventricle size is normal.  Negative for intracranial hemorrhage.
Negative for infarct or mass lesion.  There is no edema.  Calvarium
is intact.
IMPRESSION: Negative

## 2011-05-31 ENCOUNTER — Encounter (HOSPITAL_BASED_OUTPATIENT_CLINIC_OR_DEPARTMENT_OTHER): Payer: Medicaid Other

## 2011-06-05 ENCOUNTER — Encounter (HOSPITAL_COMMUNITY): Payer: Self-pay | Admitting: *Deleted

## 2011-06-05 ENCOUNTER — Emergency Department (HOSPITAL_COMMUNITY): Payer: Medicaid Other

## 2011-06-05 ENCOUNTER — Emergency Department (HOSPITAL_COMMUNITY)
Admission: EM | Admit: 2011-06-05 | Discharge: 2011-06-05 | Disposition: A | Discharge status: Home / Self Care | Payer: Medicaid Other | Attending: Emergency Medicine | Admitting: Emergency Medicine

## 2011-06-05 DIAGNOSIS — I10 Essential (primary) hypertension: Secondary | ICD-10-CM | POA: Insufficient documentation

## 2011-06-05 DIAGNOSIS — R079 Chest pain, unspecified: Secondary | ICD-10-CM | POA: Insufficient documentation

## 2011-06-05 DIAGNOSIS — E669 Obesity, unspecified: Secondary | ICD-10-CM | POA: Insufficient documentation

## 2011-06-05 DIAGNOSIS — I509 Heart failure, unspecified: Secondary | ICD-10-CM | POA: Insufficient documentation

## 2011-06-05 DIAGNOSIS — X500XXA Overexertion from strenuous movement or load, initial encounter: Secondary | ICD-10-CM | POA: Insufficient documentation

## 2011-06-05 DIAGNOSIS — F17200 Nicotine dependence, unspecified, uncomplicated: Secondary | ICD-10-CM | POA: Insufficient documentation

## 2011-06-05 DIAGNOSIS — K219 Gastro-esophageal reflux disease without esophagitis: Secondary | ICD-10-CM | POA: Insufficient documentation

## 2011-06-05 DIAGNOSIS — R059 Cough, unspecified: Secondary | ICD-10-CM | POA: Insufficient documentation

## 2011-06-05 DIAGNOSIS — J3489 Other specified disorders of nose and nasal sinuses: Secondary | ICD-10-CM | POA: Insufficient documentation

## 2011-06-05 DIAGNOSIS — R05 Cough: Secondary | ICD-10-CM | POA: Insufficient documentation

## 2011-06-05 MED ORDER — IBUPROFEN 800 MG PO TABS: 800.0000 mg | ORAL_TABLET | Freq: Three times a day (TID) | ORAL | Status: AC

## 2011-06-05 MED ORDER — CYCLOBENZAPRINE HCL 10 MG PO TABS: 10.0000 mg | ORAL_TABLET | Freq: Three times a day (TID) | ORAL | Status: AC | PRN

## 2011-06-05 NOTE — Discharge Instructions (Signed)
Ethan Wilcox the x-ray today does not show pneumonia. Treat musculoskeletal pain with ibuprofen 800 and you can use the muscle relaxers are prescribed but do not drive with them. Continue your inhalers. Try to quit smoking as much as possible.  Followup with your primary care physician or one from the list. RESOURCE GUIDE  Dental Problems  Patients with Medicaid: [facility] [phone] [address]                                           [phone] [address] Phone:  [phone]                                                  Phone:  [phone]  If unable to pay or uninsured, contact:  [facility] or [facility]. to become qualified for the adult dental clinic.  Chronic Pain Problems Contact [facility]  [phone] Patients need to be referred by their primary care doctor.  Insufficient Money for Medicine Contact [employer]:  call "211" or [facility] [phone].  No Primary Care Doctor Call [facility]  [phone] Other agencies that provide inexpensive medical care    [name]  [phone]    [facility]  [phone]    [facility]  [phone]    [facility]  [phone]    [facility]  [phone]    [facility]  [phone]  Psychological Services [facility]al [facility]  [phone] [facility]  [phone] [facility]   [phone] (emergency services [phone])  Substance Abuse Resources [facility]  [phone] [facility] [phone] [facility] [phone] Floydene Flock [phone] Residential & Outpatient Substance Abuse Program  ([phone]  Abuse/Neglect [facility] Child Abuse Hotline (229)760-6788 [facility] Child Abuse Hotline 805-704-2503 (After Hours)  Emergency Shelter [facility] [phone]  [facility]  [facility] ([phone] [facility] [phone]  MRSA Hotline #:   (571)291-7920    [facility] Resources  [facility]     [employer]                          [facility] Dept. [address] [place]                       [address]      [address]  [place]                                                Cristobal Goldmann Phone:  [phone]  Phone:  [phone]                 Phone:  [phone]  [facility] Phone:  [phone]  [facility] Child Abuse Hotline (269)139-7452 660-748-6697 (After Hours) Cough, Adult  A cough is a reflex. It helps you clear your throat and airways. A cough can help heal your body. A cough can last 2 or 3 weeks (acute) or may last more than 8 weeks (chronic). Some common causes of a cough can include an infection, allergy, or a cold. HOME CARE  Only take medicine as told by your doctor.   If given, take your medicines (antibiotics) as told. Finish them even if you start to feel better.   Use a cold steam vaporizer or humidier in your home. This can help loosen thick spit (secretions).   Sleep so you are almost sitting up (semi-upright). Use pillows to do this. This helps reduce coughing.   Rest as needed.   Stop smoking if you smoke.  GET HELP RIGHT AWAY IF:  You have yellowish-white fluid (pus) in your thick spit.   Your cough gets worse.   Your medicine does not reduce coughing, and you are losing sleep.   You cough up blood.   You have trouble breathing.   Your pain gets worse and medicine does not help.   You have a fever.  MAKE SURE YOU:   Understand these instructions.   Will watch your condition.   Will get help right away if you are not doing well or get worse.  Document Released: 09/08/2010 Document Revised: 12/15/2010 Document Reviewed: 09/08/2010 [facility] Patient  Information 2012 [place], [place].Cough, Adult  A cough is a reflex. It helps you clear your throat and airways. A cough can help heal your body. A cough can last 2 or 3 weeks (acute) or may last more than 8 weeks (chronic). Some common causes of a cough can include an infection, allergy, or a cold. HOME CARE  Only take medicine as told by your doctor.   If given, take your medicines (antibiotics) as told. Finish them even if you start to feel better.   Use a cold steam vaporizer or humidier in your home. This can help loosen thick spit (secretions).   Sleep so you are almost sitting up (semi-upright). Use pillows to do this. This helps reduce coughing.   Rest as needed.   Stop smoking if you smoke.  GET HELP RIGHT AWAY IF:  You have yellowish-white fluid (pus) in your thick spit.   Your cough gets worse.   Your medicine does not reduce coughing, and you are losing sleep.   You cough up blood.   You have trouble breathing.   Your pain gets worse and medicine does not help.   You have a fever.  MAKE SURE YOU:   Understand these instructions.   Will watch your condition.   Will get help right away if you are not doing well or get worse.  Document Released: 09/08/2010 Document Revised: 12/15/2010 Document Reviewed: 09/08/2010 [facility] Patient Information 2012 [place], [place].

## 2011-06-05 NOTE — ED Notes (Signed)
Reports non productive cough x 2 days, having left side rib pain that increases with movement, cough and breathing. No acute distress noted at triage.

## 2011-06-05 NOTE — ED Provider Notes (Signed)
History     CSN: [id number]  Arrival date & time 06/05/11  0932   First MD Initiated Contact with Patient 06/05/11 [phone]      Chief Complaint  Patient presents with  . Cough    (Consider location/radiation/quality/duration/timing/severity/associated sxs/prior treatment) Patient is a 56 y.o. male presenting with cough. The history is provided by the patient. No language interpreter was used.  Cough This is a recurrent problem. The current episode started 2 days ago. The problem occurs every few minutes. The problem has been gradually worsening. The cough is non-productive. There has been no fever. Associated symptoms include rhinorrhea. Pertinent negatives include no chills, no ear congestion, no sore throat, no shortness of breath and no wheezing.  c/o recurrent non-productive cough with no fever that started last pm.  Also c/o R musculoskeltal pain after picking up a battery yesterday.  Has taken nothing for pain. Pain is worse with cough/movement  and palpable to R lateral rib cage.  Denies SOB, calf pain or palpatations.  Pt is a smoker, etoh and cocaine abuse. pmh cardiomyopathy, hypertension chf gerd, gout.  Past Medical History  Diagnosis Date  . Nonischemic cardiomyopathy     admitted 5/11 w acute systolic CHF. LHC showed no angiographic CAD. Echo (5/11) showed EF 15-20% w diffuse sever hypokinesis, moderate MR> RHC showed PCWP 10, CI 1.47 (Fick.) cause of CMP thought to be heavy ETOH and cocaine. No ICD yet due to ongoing ETOH intake  . ETOH abuse   . Cocaine abuse   . Active smoker /    1/2 ppd  . Obesity   . Hypertension   . CHF (congestive heart failure)   . Gout   . GERD (gastroesophageal reflux disease)     History reviewed. No pertinent past surgical history.  Family History  Problem Relation Age of Onset  . Heart disease Mother     History  Substance Use Topics  . Smoking status: Current Everyday Smoker -- 0.5 packs/day  . Smokeless tobacco: Not on file  .  Alcohol Use: Yes      Review of Systems  Constitutional: Negative.  Negative for fever and chills.  HENT: Positive for rhinorrhea, sneezing and sinus pressure. Negative for sore throat.   Eyes: Negative.   Respiratory: Positive for cough. Negative for shortness of breath and wheezing.   Cardiovascular: Negative.  Negative for palpitations and leg swelling.  Gastrointestinal: Negative.  Negative for nausea, vomiting, abdominal pain and blood in stool.  Neurological: Negative.   Psychiatric/Behavioral: Negative.   All other systems reviewed and are negative.    Allergies  Penicillins  Home Medications   Current Outpatient Rx  Name Route Sig Dispense Refill  . ALBUTEROL SULFATE HFA 108 (90 BASE) MCG/ACT IN AERS Inhalation Inhale 2 puffs into the lungs every 4 (four) hours as needed. For shortness of breath    . ASPIRIN 81 MG PO TBEC Oral Take 81 mg by mouth daily.     [name] CALCIUM CARBONATE ANTACID 750 MG PO CHEW Oral Chew 3 tablets by mouth daily as needed. For indigestion    . CARVEDILOL 12.5 MG PO TABS Oral Take 12.5 mg by mouth 2 (two) times daily.     [name] FAMOTIDINE 20 MG PO TABS Oral Take 40 mg by mouth 2 (two) times daily. For stomach/  out    . FUROSEMIDE 40 MG PO TABS Oral Take 40 mg by mouth daily.    . ISOSORB DINITRATE-HYDRALAZINE 20-37.5 MG PO TABS Oral Take 0.5  tablets by mouth 3 (three) times daily.    [name] LISINOPRIL 10 MG PO TABS Oral Take 10 mg by mouth 2 (two) times daily.    . MULTIVITAL PO TABS Oral Take 1 tablet by mouth daily.      . OMEGA-3-ACID ETHYL ESTERS 1 G PO CAPS Oral Take 2 g by mouth 2 (two) times daily.    [name] OMEPRAZOLE 20 MG PO CPDR Oral Take 20 mg by mouth 2 (two) times daily.    [name] SPIRONOLACTONE 25 MG PO TABS Oral Take 25 mg by mouth daily.      BP 137/88  Pulse 86  Temp(Src) 98.5 F (36.9 C) (Oral)  Resp 18  SpO2 99%  Physical Exam  Nursing note and vitals reviewed. Constitutional: He is oriented to person, place, and time. He appears  well-developed and well-nourished.  HENT:  Head: Normocephalic.  Eyes: Conjunctivae and EOM are normal. Pupils are equal, round, and reactive to light.  Neck: Normal range of motion. Neck supple.  Cardiovascular: Normal rate, regular rhythm and intact distal pulses.  PMI is displaced.  Exam reveals distant heart sounds.   No murmur heard. Pulmonary/Chest: Effort normal. No respiratory distress. He has no wheezes. He has no rales.  Abdominal: Soft. He exhibits no distension. There is tenderness.  Musculoskeletal: Normal range of motion.  Neurological: He is alert and oriented to person, place, and time.  Skin: Skin is warm and dry.  Psychiatric: He has a normal mood and affect.    ED Course  Procedures (including critical care time)  Labs Reviewed - No data to display Dg Chest 2 View  06/05/2011  *RADIOLOGY REPORT*  Clinical Data: Non-productive cough  CHEST - 2 VIEW  Comparison: 04/12/2011  Findings: Mild cardiomegaly.  Low volumes.  Bibasilar atelectasis. No pleural effusion.  No pneumothorax. Bronchitic changes.  IMPRESSION: Cardiomegaly without edema.  Bibasilar atelectasis.  Original Report Authenticated By: Donavan Burnet, M.D.     No diagnosis found.    MDM  Nonproductive cough with R lateral chest tenderness.  rx for ibuprofen and flexeril.  Return for SOB or chest pain.  No calf pain or tachycardia.  Smoker, ETOH and cocaine pmh. Increase albuterol inhaler up to 4 times a day. Follow up with pcp this week.        Remi Haggard, NP 06/05/11 2059

## 2011-06-08 NOTE — ED Provider Notes (Signed)
Medical screening examination/treatment/procedure(s) were performed by non-physician practitioner and as supervising physician I was immediately available for consultation/collaboration.   Suzi Roots, MD 06/08/11 [phone]

## 2011-06-21 ENCOUNTER — Ambulatory Visit (HOSPITAL_BASED_OUTPATIENT_CLINIC_OR_DEPARTMENT_OTHER): Payer: Medicaid Other | Attending: Cardiology | Admitting: Radiology

## 2011-06-21 VITALS — Ht 68.0 in | Wt 250.0 lb

## 2011-06-21 DIAGNOSIS — G4733 Obstructive sleep apnea (adult) (pediatric): Secondary | ICD-10-CM | POA: Insufficient documentation

## 2011-06-21 DIAGNOSIS — I4949 Other premature depolarization: Secondary | ICD-10-CM | POA: Insufficient documentation

## 2011-06-21 DIAGNOSIS — G4730 Sleep apnea, unspecified: Secondary | ICD-10-CM

## 2011-06-21 DIAGNOSIS — R5383 Other fatigue: Secondary | ICD-10-CM

## 2011-06-26 ENCOUNTER — Other Ambulatory Visit: Payer: Self-pay | Admitting: Cardiology

## 2011-06-27 ENCOUNTER — Telehealth: Payer: Self-pay | Admitting: Pulmonary Disease

## 2011-06-27 DIAGNOSIS — G4733 Obstructive sleep apnea (adult) (pediatric): Secondary | ICD-10-CM

## 2011-06-27 DIAGNOSIS — I4949 Other premature depolarization: Secondary | ICD-10-CM

## 2011-06-27 NOTE — Telephone Encounter (Signed)
Lawson Fiscal, Dr. Shirlee Latch would like for [place] to get this pt in for sleep consult as soon as possible.  See if any consult slots in next 2 weeks.  If not, let me know.

## 2011-06-27 NOTE — Telephone Encounter (Signed)
Scheduled for 07/11/11 @ 11:15. Will contact pt tomorrow to confirm.

## 2011-06-27 NOTE — Procedures (Signed)
Ethan Wilcox, MARHEFKA               ACCOUNT NO.:  [id number]  MEDICAL RECORD NO.:  [id number]          PATIENT TYPE:  OUT  LOCATION:  SLEEP CENTER                 [facility]  PHYSICIAN:  Barbaraann Share, MD,FCCPDATE OF BIRTH:  12-17-56  DATE OF STUDY:  06/21/2011                           NOCTURNAL POLYSOMNOGRAM  REFERRING PHYSICIAN:  [name], MD  REFERRING PHYSICIAN:  [name], MD  INDICATION FOR STUDY:  Hypersomnia with sleep apnea.  EPWORTH SLEEPINESS SCORE:  12.  SLEEP ARCHITECTURE:  The patient had a total sleep time of 327 minutes with no slow-wave sleep and decreased quantity of REM.  Sleep onset latency was normal at 7 minutes and REM onset was normal at 61 minutes. Sleep efficiency was moderately reduced at 77%.  RESPIRATORY DATA:  The patient was found to have 457 obstructive and central apneas, as well as 46 obstructive hypopneas.  This gave him a an apnea/hypopnea index of 92 events per hour.  The events occurred in all body positions, but were clearly worse during REM.  There was loud snoring noted throughout.  OXYGEN DATA:  There was O2 desaturation as low as 68% with the patient's obstructive and central events.  CARDIAC DATA:  Frequent PVCs noted.  MOVEMENT/PARASOMNIA:  The patient had no significant leg jerks or other abnormal behavior seen.  IMPRESSION/RECOMMENDATION: 1. Very severe obstructive sleep apnea/hypopnea syndrome with an AHI     of 92 events per hour and oxygen desaturation as low as 68%.     Treatment for this degree of sleep apnea should focus primarily on     CPAP as well as aggressive weight loss. 2. Frequent premature ventricular contractions noted throughout.     Barbaraann Share, MD,FCCP Diplomate, American Board of Sleep Medicine   [facility]/MEDQ  D:  06/27/2011 08:39:59  T:  06/27/2011 10:22:08  Job:  [id number]

## 2011-06-28 NOTE — Telephone Encounter (Signed)
I spoke with the pt's daughter and she will let the pt know that he has an appt on 07/11/11 and needs to arrive at 11am. She has the phone number and will have pt call if this is not okay.

## 2011-07-11 ENCOUNTER — Encounter: Payer: Self-pay | Admitting: Pulmonary Disease

## 2011-07-11 ENCOUNTER — Ambulatory Visit: Payer: Medicaid Other | Admitting: Cardiology

## 2011-07-11 ENCOUNTER — Ambulatory Visit (INDEPENDENT_AMBULATORY_CARE_PROVIDER_SITE_OTHER): Payer: Medicaid Other | Admitting: Pulmonary Disease

## 2011-07-11 VITALS — BP 114/72 | HR 54 | Temp 98.8°F | Ht 67.0 in | Wt 255.4 lb

## 2011-07-11 DIAGNOSIS — G4733 Obstructive sleep apnea (adult) (pediatric): Secondary | ICD-10-CM

## 2011-07-11 NOTE — Patient Instructions (Addendum)
Will start you on cpap at a moderate pressure level, and will adjust later for you.  Please call if having tolerance issues.  Work on weight loss. followup with me in 6weeks.

## 2011-07-11 NOTE — Progress Notes (Signed)
  Subjective:    Patient ID: Ethan Wilcox, male    DOB: 10/10/1955, 56 y.o.   MRN: [id number]  HPI The patient is a 56 year old male who I've been asked to see for management of obstructive sleep apnea.  He has a known cardiomyopathy, and recently underwent a sleep study where he was found to have very severe obstructive sleep apnea.  The patient has been noted to have loud snoring, but he is unsure if he has any breathing issues during sleep.  He describes rare choking arousals.  He has frequent awakenings during the night, and is usually not rested in the mornings upon arising.  He describes significant sleep pressure during the day with periods of inactivity, and will often fall asleep.  He will also follow asleep easily watching TV, but denies any issues with driving.  The patient states that his weight is up 15 pounds over the last 2 years, and his Epworth score at the time of his sleep study was 4.    Sleep Questionnaire: What time do you typically go to bed?( Between what hours) 12 am How long does it take you to fall asleep? 15 minutes How many times during the night do you wake up? 5 What time do you get out of bed to start your day? 0430 Do you drive or operate heavy machinery in your occupation? No How much has your weight changed (up or down) over the past two years? (In pounds) 50 lb (22.68 kg) Have you ever had a sleep study before? No Do you currently use CPAP? No Do you wear oxygen at any time? No    Review of Systems  Constitutional: Positive for unexpected weight change. Negative for fever.  HENT: Positive for dental problem. Negative for ear pain, nosebleeds, congestion, sore throat, rhinorrhea, sneezing, trouble swallowing, postnasal drip and sinus pressure.   Eyes: Negative for redness and itching.  Respiratory: Positive for cough and shortness of breath. Negative for chest tightness and wheezing.   Cardiovascular: Positive for leg swelling. Negative for palpitations.    Gastrointestinal: Negative for nausea and vomiting.       Heartburn Indigestion   Genitourinary: Negative for dysuria.  Musculoskeletal: Positive for arthralgias. Negative for joint swelling.  Skin: Negative for rash.  Neurological: Positive for headaches.  Hematological: Does not bruise/bleed easily.  Psychiatric/Behavioral: Negative for dysphoric mood. The patient is nervous/anxious.   All other systems reviewed and are negative.       Objective:   Physical Exam Constitutional:  Obese male, no acute distress  HENT:  Nares patent without discharge, large turbinates noted.   Oropharynx without exudate, palate and uvula are thick and elongated.  Very small posterior pharyngeal space.   Eyes:  Perrla, eomi, no scleral icterus  Neck:  No JVD, no TMG  Cardiovascular:  Normal rate, irregular rhythm, no rubs or gallops.  No murmurs        Intact distal pulses  Pulmonary :  Normal breath sounds, no stridor or respiratory distress   No rales, rhonchi, or wheezing  Abdominal:  Soft, nondistended, bowel sounds present.  No tenderness noted.   Musculoskeletal:  minimal lower extremity edema noted.  Lymph Nodes:  No cervical lymphadenopathy noted  Skin:  No cyanosis noted  Neurologic:  Appears sleepy but appropriate, moves all 4 extremities without obvious deficit.         Assessment & Plan:

## 2011-07-11 NOTE — Assessment & Plan Note (Signed)
The patient has very severe obstructive sleep apnea by his recent study, and also significant underlying heart disease.  I have reviewed the pathophysiology of sleep apnea with him, including its impact to his quality of life and cardiovascular health.  The patient is willing to try CPAP, and will therefore refer him to a DME.   I will set the patient up on cpap at a moderate pressure level to allow for desensitization, and will troubleshoot the device over the next 4-6weeks if needed.  The pt is to call me if having issues with tolerance.  Will then optimize the pressure once patient is able to wear cpap on a consistent basis.

## 2011-07-14 ENCOUNTER — Ambulatory Visit (INDEPENDENT_AMBULATORY_CARE_PROVIDER_SITE_OTHER): Payer: Medicaid Other | Admitting: Cardiology

## 2011-07-14 ENCOUNTER — Encounter: Payer: Self-pay | Admitting: Cardiology

## 2011-07-14 VITALS — BP 118/82 | HR 105 | Ht 67.0 in | Wt 257.0 lb

## 2011-07-14 DIAGNOSIS — I428 Other cardiomyopathies: Secondary | ICD-10-CM

## 2011-07-14 DIAGNOSIS — F17200 Nicotine dependence, unspecified, uncomplicated: Secondary | ICD-10-CM

## 2011-07-14 DIAGNOSIS — G4733 Obstructive sleep apnea (adult) (pediatric): Secondary | ICD-10-CM

## 2011-07-14 DIAGNOSIS — R0602 Shortness of breath: Secondary | ICD-10-CM

## 2011-07-14 DIAGNOSIS — I5022 Chronic systolic (congestive) heart failure: Secondary | ICD-10-CM

## 2011-07-14 DIAGNOSIS — I429 Cardiomyopathy, unspecified: Secondary | ICD-10-CM

## 2011-07-14 MED ORDER — FUROSEMIDE 40 MG PO TABS: ORAL_TABLET | ORAL | Status: DC

## 2011-07-14 NOTE — Patient Instructions (Addendum)
Your physician recommends that you schedule a follow-up appointment in: ONE MONTH WITH DR [facility]  INCREASE FUROSEMIDE TO 40 MG IN THE MORNING AND 20 MG IN THE AFTERNOON  Your physician recommends that you return for lab work in: 2 WEEKS  REFERRAL TO NUTRITION FOR LOW SALT INTAKE2 Gram Low Sodium Diet A 2 gram sodium diet restricts the amount of sodium in the diet to no more than 2 g or 2000 mg daily. Limiting the amount of sodium is often used to help lower blood pressure. It is important if you have heart, liver, or kidney problems. Many foods contain sodium for flavor and sometimes as a preservative. When the amount of sodium in a diet needs to be low, it is important to know what to look for when choosing foods and drinks. The following includes some information and guidelines to help make it easier for you to adapt to a low sodium diet. QUICK TIPS  Do not add salt to food.   Avoid convenience items and fast food.   Choose unsalted snack foods.   Buy lower sodium products, often labeled as "lower sodium" or "no salt added."   Check food labels to learn how much sodium is in 1 serving.   When eating at a restaurant, ask that your food be prepared with less salt or none, if possible.  READING FOOD LABELS FOR SODIUM INFORMATION The nutrition facts label is a good place to find how much sodium is in foods. Look for products with no more than 500 to 600 mg of sodium per meal and no more than 150 mg per serving. Remember that 2 g = 2000 mg. The food label may also list foods as:  Sodium-free: Less than 5 mg in a serving.   Very low sodium: 35 mg or less in a serving.   Low-sodium: 140 mg or less in a serving.   Light in sodium: 50% less sodium in a serving. For example, if a food that usually has 300 mg of sodium is changed to become light in sodium, it will have 150 mg of sodium.   Reduced sodium: 25% less sodium in a serving. For example, if a food that usually has 400 mg of sodium  is changed to reduced sodium, it will have 300 mg of sodium.  CHOOSING FOODS Grains  Avoid: Salted crackers and snack items. Some cereals, including instant hot cereals. Bread stuffing and biscuit mixes. Seasoned rice or pasta mixes.   Choose: Unsalted snack items. Low-sodium cereals, oats, puffed wheat and rice, shredded wheat. English muffins and bread. Pasta.  Meats  Avoid: Salted, canned, smoked, spiced, pickled meats, including fish and poultry. Bacon, ham, sausage, cold cuts, hot dogs, anchovies.   Choose: Low-sodium canned tuna and salmon. Fresh or frozen meat, poultry, and fish.  Dairy  Avoid: Processed cheese and spreads. Cottage cheese. Buttermilk and condensed milk. Regular cheese.   Choose: Milk. Low-sodium cottage cheese. Yogurt. Sour cream. Low-sodium cheese.  Fruits and Vegetables  Avoid: Regular canned vegetables. Regular canned tomato sauce and paste. Frozen vegetables in sauces. Olives. Rosita Fire. Relishes. Sauerkraut.   Choose: Low-sodium canned vegetables. Low-sodium tomato sauce and paste. Frozen or fresh vegetables. Fresh and frozen fruit.  Condiments  Avoid: Canned and packaged gravies. Worcestershire sauce. Tartar sauce. Barbecue sauce. Soy sauce. Steak sauce. Ketchup. Onion, garlic, and table salt. Meat flavorings and tenderizers.   Choose: Fresh and dried herbs and spices. Low-sodium varieties of mustard and ketchup. Lemon juice. Tabasco sauce. Horseradish.  SAMPLE  2 GRAM SODIUM MEAL PLAN Breakfast / Sodium (mg)  1 cup low-fat milk / 143 mg   2 slices whole-wheat toast / 270 mg   1 tbs heart-healthy margarine / 153 mg   1 hard-boiled egg / 139 mg   1 small orange / 0 mg  Lunch / Sodium (mg)  1 cup raw carrots / 76 mg    cup hummus / 298 mg   1 cup low-fat milk / 143 mg    cup red grapes / 2 mg   1 whole-wheat pita bread / 356 mg  Dinner / Sodium (mg)  1 cup whole-wheat pasta / 2 mg   1 cup low-sodium tomato sauce / 73 mg   3 oz lean  ground beef / 57 mg   1 small side salad (1 cup raw spinach leaves,  cup cucumber,  cup yellow bell pepper) with 1 tsp olive oil and 1 tsp red wine vinegar / 25 mg  Snack / Sodium (mg)  1 container low-fat vanilla yogurt / 107 mg   3 graham cracker squares / 127 mg  Nutrient Analysis  Calories: 2033   Protein: 77 g   Carbohydrate: 282 g   Fat: 72 g   Sodium: 1971 mg  Document Released: 12/26/2004 Document Revised: 12/15/2010 Document Reviewed: 03/29/2009 [facility] Patient Information 2012 [place], [place].

## 2011-07-16 NOTE — Assessment & Plan Note (Signed)
I counselled him again to quit smoking.

## 2011-07-16 NOTE — Progress Notes (Signed)
Patient ID: Ethan Wilcox, male   DOB: 07-Aug-1955, 56 y.o.   MRN: [id number] PCP: Dr. Julio Sicks  56 yo with history of nonischemic cardiomyopathy presents for cardiology followup. Ethan Wilcox had a prolonged admission in 5/11 for CHF. Echo showed EF 15-20% with diffuse hypokinesis. Left and right heart cath showed no angiographic CAD and Fick CI 1.47. Patient became hypotensive with ARF (cardiogenic shock) requiring milrinone and dopamine. He was gradually titrated off these medications and begun on an oral regimen.  He saw Dr. Ladona Ridgel regarding an ICD but actually at that time admitted to resuming ETOH intake. Therefore, no ICD at this time.  He continues to drink about the same amount.    Since I last saw him, he has been stable.  He is still short of breath after walking "long distances" or going up steps.  He fatigues easily.  He has been recently diagnosed with severe sleep apnea and is getting set up for CPAP.  Weight is up 11 lbs since last appointment.  He is not very active due to back pain with walking.    Labs (5/11): K 4.4, creatinine 1.23, LDL 104, HDL 33  Labs (6/11): BNP 97, K 4.8, creatinine 1.4  Labs (7/11): K 4.8, creatinine 1.0, BNP 53  Labs (10/11): BNP 25, K 4.8 => 4.3, creatinine 1.4 => 1.3  Labs (12/11): BNP 25, K 4.9, creatinine 1.2  Labs (3/12): K 4.3, creatinine 1.2 Labs (11/12): K 4, creatinine 1.3, BNP 82, TGs 518, HDL 39, HDL 60 Labs (2/13): AST 72, ALT 96 Labs (4/13): K 4.4, creatinine 0.95, proBNP 185 Labs (5/13): K 4.3, creatinine 1.2, BNP 32  Allergies (verified):  1) ! Penicillin   Past Medical History:  1. Nonischemic Cardiomyopathy: Admitted 5/11 with acute systolic CHF. LHC showed no angiographic CAD. Echo (5/11) showed EF 15-20% with diffuse severe hypokinesis, moderate MR. RHC showed PCWP 10, CI 1.47 (Fick). Cause of CMP thought to be heavy ETOH and cocaine. No ICD yet due to ongoing ETOH intake.  2. ETOH abuse 3. Cocaine abuse: Quit in 2011  4. Active  smoker: 1/2 ppd  5. Obesity  6. OSA: Severe  Family History:  "Heart disease" in mother   Social History:  Worked as [employer]. Smokes 1/2 ppd. 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 Heavy ETOH in the past, now back to drinking 1/2 pint daily.    Current Outpatient Prescriptions  Medication Sig Dispense Refill  . albuterol (PROVENTIL HFA) 108 (90 BASE) MCG/ACT inhaler Inhale 2 puffs into the lungs every 4 (four) hours as needed. For shortness of breath      . aspirin (ASPIR-LOW) 81 MG EC tablet Take 81 mg by mouth daily.       . calcium carbonate (TUMS EX) 750 MG chewable tablet Chew 3 tablets by mouth daily as needed. For indigestion      . carvedilol (COREG) 12.5 MG tablet Take 12.5 mg by mouth 2 (two) times daily.       . famotidine (PEPCID) 20 MG tablet Take 40 mg by mouth 2 (two) times daily. For stomach/  out      . furosemide (LASIX) 40 MG tablet TAKE ONE TABLET EVERY MORNING AND ONE HALF TABLET EVERY AFTERNOON  45 tablet  11  . isosorbide-hydrALAZINE (BIDIL) 20-37.5 MG per tablet Take 0.5 tablets by mouth 3 (three) times daily.      [name] lisinopril (PRINIVIL,ZESTRIL) 10 MG tablet Take 10 mg by mouth 2 (two) times daily.      [name]  Multiple Vitamins-Minerals (MULTIVITAL) tablet Take 1 tablet by mouth daily.        [name] omega-3 acid ethyl esters (LOVAZA) 1 G capsule Take 2 g by mouth 2 (two) times daily.      [name] omeprazole (PRILOSEC) 20 MG capsule Take 20 mg by mouth 2 (two) times daily.      [name] spironolactone (ALDACTONE) 25 MG tablet Take 25 mg by mouth daily.        BP 118/82  Pulse 105  Ht 5\' 7"  (1.702 m)  Wt 116.574 kg (257 lb)  BMI 40.25 kg/m2  SpO2 97% General: NAD, obese Neck: Thick, JVP difficult, no thyromegaly or thyroid nodule.  Lungs: Decreased breath sounds at bases.  CV: Nondisplaced PMI.  Heart regular S1/S2, no S3/S4, no murmur.  No peripheral edema.  No carotid bruit.  Normal pedal pulses.  Abdomen: Soft, nontender, no hepatosplenomegaly, no distention.  Neurologic: Alert  and oriented x 3.  Psych: Normal affect. Extremities: No clubbing or cyanosis.

## 2011-07-16 NOTE — Assessment & Plan Note (Signed)
Nonischemic cardiomyopathy, possibly ETOH-related.  He does not appear grossly volume overloaded but exam is difficult given body habitus.  Weight is up 11 lbs since last appointment.  He still has NYHA class III symptoms.   - Continue current doses of Coreg, lisinopril, spironolactone, and Bidil.  He has not tolerated uptitration of Bidil due to headache.  - Increase Lasix to 40 qam, 20 qpm.   - Not candidate for ICD until he can quit drinking.   - BMET/BNP in 2 wks.  - I am going to have him see the [name] nutritionist to learn a low salt diet.  His diet is quit high in sodium.

## 2011-07-16 NOTE — Assessment & Plan Note (Signed)
Severe OSA on sleep study, he should be getting CPAP soon.

## 2011-07-28 ENCOUNTER — Other Ambulatory Visit (INDEPENDENT_AMBULATORY_CARE_PROVIDER_SITE_OTHER): Payer: Medicaid Other

## 2011-07-28 DIAGNOSIS — I428 Other cardiomyopathies: Secondary | ICD-10-CM

## 2011-07-28 DIAGNOSIS — R0602 Shortness of breath: Secondary | ICD-10-CM

## 2011-07-28 LAB — BRAIN NATRIURETIC PEPTIDE: Pro B Natriuretic peptide (BNP): 15 pg/mL (ref 0.0–100.0)

## 2011-07-28 LAB — BASIC METABOLIC PANEL
Calcium: 9.4 mg/dL (ref 8.4–10.5)
Creatinine, Ser: 1.2 mg/dL (ref 0.4–1.5)

## 2011-08-17 ENCOUNTER — Encounter: Payer: Self-pay | Admitting: Cardiology

## 2011-08-17 ENCOUNTER — Ambulatory Visit (INDEPENDENT_AMBULATORY_CARE_PROVIDER_SITE_OTHER): Payer: Medicaid Other | Admitting: Cardiology

## 2011-08-17 VITALS — BP 126/74 | HR 50 | Ht 67.0 in | Wt 253.0 lb

## 2011-08-17 DIAGNOSIS — F17200 Nicotine dependence, unspecified, uncomplicated: Secondary | ICD-10-CM

## 2011-08-17 DIAGNOSIS — I5022 Chronic systolic (congestive) heart failure: Secondary | ICD-10-CM

## 2011-08-17 DIAGNOSIS — G4733 Obstructive sleep apnea (adult) (pediatric): Secondary | ICD-10-CM

## 2011-08-17 MED ORDER — LISINOPRIL 10 MG PO TABS: ORAL_TABLET | ORAL | Status: DC

## 2011-08-17 NOTE — Assessment & Plan Note (Signed)
Nonischemic cardiomyopathy, possibly ETOH-related.  He does not appear volume overloaded but exam is difficult given body habitus.  Weight is down and symptoms, though still NYHA class III, are improved. - Continue current doses of Coreg, spironolactone, and Bidil.  He has not tolerated uptitration of Bidil due to headache.  - Continue Lasix 40 qam, 20 qpm.   - Not candidate for ICD until he can quit drinking => he has made some progress and is cutting back. - Increase lisinopril to 15 mg bid.  - BMET/BNP in 2 wks.  - Still needs to work on diet.  Cut out soup unless it is specifically low sodium.

## 2011-08-17 NOTE — Assessment & Plan Note (Signed)
I counselled him again to quit smoking.  

## 2011-08-17 NOTE — Assessment & Plan Note (Signed)
Wearing CPAP, doing better.

## 2011-08-17 NOTE — Progress Notes (Signed)
Patient ID: Ethan Wilcox, male   DOB: 24-Jan-1955, 56 y.o.   MRN: [id number] PCP: Dr. Julio Sicks  56 yo with history of nonischemic cardiomyopathy presents for cardiology followup. Ethan Wilcox had a prolonged admission in 5/11 for CHF. Echo showed EF 15-20% with diffuse hypokinesis. Left and right heart cath showed no angiographic CAD and Fick CI 1.47. Patient became hypotensive with ARF (cardiogenic shock) requiring milrinone and dopamine. He was gradually titrated off these medications and begun on an oral regimen.  He saw Dr. Ladona Ridgel regarding an ICD but actually at that time admitted to resuming ETOH intake. Therefore, no ICD yet.  He says he has cut back to 1/4 pint liquor daily.    He is symptomatically better since I last saw him.  Weight is down 4 lbs.  He is using CPAP and is less fatigued.  He is short of breath after walking about 50 yards.  He is able to play with his grandchildren.  He still follows a high salt diet and eats soup frequently.   Labs (5/11): K 4.4, creatinine 1.23, LDL 104, HDL 33  Labs (6/11): BNP 97, K 4.8, creatinine 1.4  Labs (7/11): K 4.8, creatinine 1.0, BNP 53  Labs (10/11): BNP 25, K 4.8 => 4.3, creatinine 1.4 => 1.3  Labs (12/11): BNP 25, K 4.9, creatinine 1.2  Labs (3/12): K 4.3, creatinine 1.2 Labs (11/12): K 4, creatinine 1.3, BNP 82, TGs 518, HDL 39, HDL 60 Labs (2/13): AST 72, ALT 96 Labs (4/13): K 4.4, creatinine 0.95, proBNP 185 Labs (5/13): K 4.3, creatinine 1.2, BNP 32 Labs (7/13): K 4.1, creatinine 1.2, BNP 15  Allergies (verified):  1) ! Penicillin   Past Medical History:  1. Nonischemic Cardiomyopathy: Admitted 5/11 with acute systolic CHF. LHC showed no angiographic CAD. Echo (5/11) showed EF 15-20% with diffuse severe hypokinesis, moderate MR. RHC showed PCWP 10, CI 1.47 (Fick). Cause of CMP thought to be heavy ETOH and cocaine. No ICD yet due to ongoing ETOH intake.  2. ETOH abuse 3. Cocaine abuse: Quit in 2011  4. Active smoker: 1/2 ppd  5.  Obesity  6. OSA: Severe  Family History:  "Heart disease" in mother   Social History:  Worked as [employer]er. Smokes 1/2 ppd. 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 Heavy ETOH in the past, now drinking 1/4 pint daily.   ROS: All systems reviewed and negative except as per HPI.    Current Outpatient Prescriptions  Medication Sig Dispense Refill  . albuterol (PROVENTIL HFA) 108 (90 BASE) MCG/ACT inhaler Inhale 2 puffs into the lungs every 4 (four) hours as needed. For shortness of breath      . aspirin (ASPIR-LOW) 81 MG EC tablet Take 81 mg by mouth daily.       . calcium carbonate (TUMS EX) 750 MG chewable tablet Chew 3 tablets by mouth daily as needed. For indigestion      . carvedilol (COREG) 12.5 MG tablet Take 12.5 mg by mouth 2 (two) times daily.       . furosemide (LASIX) 40 MG tablet TAKE ONE TABLET EVERY MORNING AND ONE HALF TABLET EVERY AFTERNOON  45 tablet  11  . isosorbide-hydrALAZINE (BIDIL) 20-37.5 MG per tablet Take 0.5 tablets by mouth 3 (three) times daily.      . magnesium hydroxide (MILK OF MAGNESIA) 400 MG/5ML suspension Take by mouth daily as needed.      . Multiple Vitamins-Minerals (MULTIVITAL) tablet Take 2 tablets by mouth daily.       [name]  omega-3 acid ethyl esters (LOVAZA) 1 G capsule Take 2 g by mouth 2 (two) times daily.      [name] omeprazole (PRILOSEC) 20 MG capsule Take 20 mg by mouth 2 (two) times daily.      [name] spironolactone (ALDACTONE) 25 MG tablet Take 25 mg by mouth daily.      [name] DISCONTD: lisinopril (PRINIVIL,ZESTRIL) 10 MG tablet Take 10 mg by mouth 2 (two) times daily.      [name] lisinopril (PRINIVIL,ZESTRIL) 10 MG tablet 1 and 1/2 tablets (total 15mg ) twice a day  90 tablet  6    BP 126/74  Pulse 50  Ht 5\' 7"  (1.702 m)  Wt 253 lb (114.76 kg)  BMI 39.63 kg/m2  SpO2 98% General: NAD, obese Neck: Thick, JVP difficult, no thyromegaly or thyroid nodule.  Lungs: Decreased breath sounds at bases.  CV: Nondisplaced PMI.  Heart regular S1/S2, no S3/S4, no murmur.  No  peripheral edema.  No carotid bruit.  Normal pedal pulses.  Abdomen: Soft, nontender, no hepatosplenomegaly, no distention.  Neurologic: Alert and oriented x 3.  Psych: Normal affect. Extremities: No clubbing or cyanosis.

## 2011-08-17 NOTE — Patient Instructions (Addendum)
Increase lisinopril to 15mg  twice a day. This will be one and one-half 10mg  tablets twice a day.  Your physician recommends that you return for lab work in: 2 weeks--BMET.  Your physician recommends that you schedule a follow-up appointment in: 2 months with Dr Shirlee Latch.

## 2011-08-22 ENCOUNTER — Ambulatory Visit: Payer: Medicaid Other | Admitting: Pulmonary Disease

## 2011-08-31 ENCOUNTER — Other Ambulatory Visit (INDEPENDENT_AMBULATORY_CARE_PROVIDER_SITE_OTHER): Payer: Medicaid Other

## 2011-08-31 DIAGNOSIS — I5022 Chronic systolic (congestive) heart failure: Secondary | ICD-10-CM

## 2011-08-31 LAB — BASIC METABOLIC PANEL
BUN: 14 mg/dL (ref 6–23)
CO2: 27 mEq/L (ref 19–32)
Calcium: 9.1 mg/dL (ref 8.4–10.5)
Creatinine, Ser: 1 mg/dL (ref 0.4–1.5)
Glucose, Bld: 95 mg/dL (ref 70–99)
Potassium: 4.6 mEq/L (ref 3.5–5.1)
Sodium: 135 mEq/L (ref 135–145)

## 2011-09-06 ENCOUNTER — Emergency Department (HOSPITAL_COMMUNITY)
Admission: EM | Admit: 2011-09-06 | Discharge: 2011-09-06 | Disposition: A | Discharge status: Home / Self Care | Payer: Medicaid Other | Attending: Emergency Medicine | Admitting: Emergency Medicine

## 2011-09-06 ENCOUNTER — Encounter (HOSPITAL_COMMUNITY): Payer: Self-pay | Admitting: *Deleted

## 2011-09-06 DIAGNOSIS — L739 Follicular disorder, unspecified: Secondary | ICD-10-CM

## 2011-09-06 DIAGNOSIS — I10 Essential (primary) hypertension: Secondary | ICD-10-CM | POA: Insufficient documentation

## 2011-09-06 DIAGNOSIS — F17200 Nicotine dependence, unspecified, uncomplicated: Secondary | ICD-10-CM | POA: Insufficient documentation

## 2011-09-06 DIAGNOSIS — L738 Other specified follicular disorders: Secondary | ICD-10-CM | POA: Insufficient documentation

## 2011-09-06 DIAGNOSIS — M109 Gout, unspecified: Secondary | ICD-10-CM | POA: Insufficient documentation

## 2011-09-06 DIAGNOSIS — K5900 Constipation, unspecified: Secondary | ICD-10-CM | POA: Insufficient documentation

## 2011-09-06 DIAGNOSIS — E669 Obesity, unspecified: Secondary | ICD-10-CM | POA: Insufficient documentation

## 2011-09-06 DIAGNOSIS — K645 Perianal venous thrombosis: Secondary | ICD-10-CM | POA: Insufficient documentation

## 2011-09-06 DIAGNOSIS — K219 Gastro-esophageal reflux disease without esophagitis: Secondary | ICD-10-CM | POA: Insufficient documentation

## 2011-09-06 MED ORDER — HYDROCORTISONE ACETATE 25 MG RE SUPP: 25.0000 mg | Freq: Two times a day (BID) | RECTAL | Status: AC

## 2011-09-06 MED ORDER — OXYCODONE-ACETAMINOPHEN 5-325 MG PO TABS: 1.0000 | ORAL_TABLET | Freq: Four times a day (QID) | ORAL | Status: AC | PRN

## 2011-09-06 MED ORDER — POLYETHYLENE GLYCOL 3350 17 GM/SCOOP PO POWD: 17.0000 g | Freq: Two times a day (BID) | ORAL | Status: AC

## 2011-09-06 MED ORDER — LIDOCAINE HCL 2 % EX GEL: CUTANEOUS | Status: DC | PRN

## 2011-09-06 NOTE — ED Provider Notes (Signed)
Medical screening examination/treatment/procedure(s) were conducted as a shared visit with non-physician practitioner(s) and myself.  I personally evaluated the patient during the encounter Pt with non-thrombosed hemorrhoids and no abd findings.  Small pimple on the buttocks without abscess  Gwyneth Sprout, MD 09/06/11 1928

## 2011-09-06 NOTE — ED Notes (Signed)
Pt dressed and waiting to be discharged home

## 2011-09-06 NOTE — ED Provider Notes (Signed)
History     CSN: [id number]  Arrival date & time 09/06/11  1139   First MD Initiated Contact with Patient 09/06/11 1503      Chief Complaint  Patient presents with  . Hemorrhoids  . Abscess    (Consider location/radiation/quality/duration/timing/severity/associated sxs/prior treatment) HPI Comments: Ethan Wilcox 56 y.o. male   The chief complaint is: Patient presents with:   Hemorrhoids   Abscess   The patient has medical history significant for:   Past Medical History:   Nonischemic cardiomyopathy                                     Comment:admitted 5/11 w acute systolic CHF. LHC showed               no angiographic CAD. Echo (5/11) showed EF               15-20% w diffuse sever hypokinesis, moderate               MR> RHC showed PCWP 10, CI 1.47 (Fick.) cause               of CMP thought to be heavy ETOH and cocaine. No              ICD yet due to ongoing ETOH intake   ETOH abuse                                                   Cocaine abuse                                                Active smoker                                        Comment:1/2 ppd   Obesity                                                      Hypertension                                                 CHF (congestive heart failure)                               Gout                                                         GERD (gastroesophageal reflux disease)  Patient with a history of constipation presents with complaints for hemorrhoids. He states that he has had rectal discomfort for about 4-5 days. He rates the pain 8/10 and states that he has tried tylenol for pain and milk of magnesia for constipation. Last bowel movement was at 10:30am. Denies hematochezia or melena, but states he notices occasional bight red blood on the toilet paper when wiping. Denies fever or chills. Denies NVD or abdominal pain. Patient is due for colonoscopy.      The history is  provided by the patient.    Past Medical History  Diagnosis Date  . Nonischemic cardiomyopathy     admitted 5/11 w acute systolic CHF. LHC showed no angiographic CAD. Echo (5/11) showed EF 15-20% w diffuse sever hypokinesis, moderate MR> RHC showed PCWP 10, CI 1.47 (Fick.) cause of CMP thought to be heavy ETOH and cocaine. No ICD yet due to ongoing ETOH intake  . ETOH abuse   . Cocaine abuse   . Active smoker /    1/2 ppd  . Obesity   . Hypertension   . CHF (congestive heart failure)   . Gout   . GERD (gastroesophageal reflux disease)     Past Surgical History  Procedure Date  . No past surgeries     Family History  Problem Relation Age of Onset  . Heart disease Mother   . Cancer Mother     History  Substance Use Topics  . Smoking status: Current Everyday Smoker -- 0.5 packs/day for 38 years  . Smokeless tobacco: Not on file  . Alcohol Use: Yes     Vodka 1/2 pint 3 days a week      Review of Systems  Constitutional: Negative for fever and chills.  Gastrointestinal: Positive for constipation, anal bleeding and rectal pain. Negative for nausea, vomiting, abdominal pain, diarrhea, blood in stool and abdominal distention.  All other systems reviewed and are negative.    Allergies  Penicillins  Home Medications   Current Outpatient Rx  Name Route Sig Dispense Refill  . ALBUTEROL SULFATE HFA 108 (90 BASE) MCG/ACT IN AERS Inhalation Inhale 2 puffs into the lungs every 4 (four) hours as needed. For shortness of breath    . ASPIRIN 81 MG PO TBEC Oral Take 81 mg by mouth daily.     [name] CARVEDILOL 12.5 MG PO TABS Oral Take 12.5 mg by mouth 2 (two) times daily.     . FUROSEMIDE 40 MG PO TABS Oral Take 20-40 mg by mouth 2 (two) times daily. 40 mg in a.m. And 20 mg in afternoon    . ISOSORB DINITRATE-HYDRALAZINE 20-37.5 MG PO TABS Oral Take 0.5 tablets by mouth 3 (three) times daily.    [name] LISINOPRIL 10 MG PO TABS  1 and 1/2 tablets (total 15mg ) twice a day 90 tablet 6  .  MULTIVITAL PO TABS Oral Take 2 tablets by mouth daily.     . OMEGA-3-ACID ETHYL ESTERS 1 G PO CAPS Oral Take 2 g by mouth 2 (two) times daily.    [name] OMEPRAZOLE 20 MG PO CPDR Oral Take 20 mg by mouth 2 (two) times daily.    [name] SPIRONOLACTONE 25 MG PO TABS Oral Take 25 mg by mouth daily.      BP 149/107  Pulse 95  Temp 98.4 F (36.9 C) (Oral)  Resp 18  SpO2 99%  Physical Exam  Nursing note and vitals reviewed. Constitutional: He appears well-developed and well-nourished. No distress.  HENT:  Head:  Normocephalic and atraumatic.  Mouth/Throat: Oropharynx is clear and moist.  Eyes: Conjunctivae and EOM are normal. No scleral icterus.  Neck: Normal range of motion. Neck supple.  Cardiovascular: Normal rate, regular rhythm and normal heart sounds.   Abdominal: Bowel sounds are normal. He exhibits distension. He exhibits no mass. There is no tenderness. There is no rebound and no guarding.       Patient seems mildly distended on palpation of the RUQ and RLQ, most likely secondary to constipation.  Genitourinary: Rectum normal. Rectal exam shows no external hemorrhoid, no fissure and no mass.     Neurological: He is alert.  Skin: Skin is warm.    ED Course  Procedures (including critical care time)  Labs Reviewed - No data to display No results found.   1. Hemorrhoids, external, thrombosed   2. Constipation   3. Folliculitis       MDM  Patient presented with complain of hemorrhoids and right gluteal abscess. Patient denied current pain as he has been taking tylenol, pain medication not wanted. No external hemorrhoids appreciated on exam. Area on right glut looks like the beginning of an abscess but I can not make argument for I&D. Per Dr. Anitra Lauth, area looks more consistent with folliculitis. Area on right glut cleaned with lidocaine and an 18 gauge needle used to express pus and blood. Patient instructed to use warm compresses on the area. Patient counseled on need for increased  fiber in his diet. Patient will be discharged on pain medication, lidocaine jelly, Anusol suppository, and referral to GI for colonoscopy. No red flags for SBO or GI bleed. Return precautions given verbally and in discharge summary.        Pixie Casino, PA-C 09/06/11 1605

## 2011-09-06 NOTE — ED Notes (Signed)
Pt brought to room, undressed and in gown at this time

## 2011-09-06 NOTE — ED Notes (Signed)
Pt reports discomfort to rectum, states he thinks are hemorrhoids. Pt reports he also has abscess to right glut. Pt reports last BM today. Reports constipation issues.

## 2011-09-18 ENCOUNTER — Encounter: Payer: Self-pay | Admitting: Pulmonary Disease

## 2011-09-18 ENCOUNTER — Ambulatory Visit: Payer: Medicaid Other | Admitting: Pulmonary Disease

## 2011-10-10 ENCOUNTER — Ambulatory Visit: Payer: Medicaid Other | Admitting: Pulmonary Disease

## 2011-10-13 ENCOUNTER — Encounter: Payer: Self-pay | Admitting: Cardiology

## 2011-10-17 ENCOUNTER — Telehealth: Payer: Self-pay

## 2011-10-17 NOTE — Telephone Encounter (Signed)
Patient came by office aspirin 81 mg samples gave to patient.

## 2011-10-18 ENCOUNTER — Ambulatory Visit: Payer: Medicaid Other | Admitting: Cardiology

## 2011-11-15 ENCOUNTER — Encounter: Payer: Self-pay | Admitting: Cardiology

## 2011-11-15 ENCOUNTER — Ambulatory Visit (INDEPENDENT_AMBULATORY_CARE_PROVIDER_SITE_OTHER): Payer: Medicaid Other | Admitting: Cardiology

## 2011-11-15 VITALS — BP 116/72 | HR 94 | Ht 67.0 in | Wt 242.0 lb

## 2011-11-15 DIAGNOSIS — G4733 Obstructive sleep apnea (adult) (pediatric): Secondary | ICD-10-CM

## 2011-11-15 DIAGNOSIS — F1010 Alcohol abuse, uncomplicated: Secondary | ICD-10-CM

## 2011-11-15 DIAGNOSIS — F17200 Nicotine dependence, unspecified, uncomplicated: Secondary | ICD-10-CM

## 2011-11-15 DIAGNOSIS — I5022 Chronic systolic (congestive) heart failure: Secondary | ICD-10-CM

## 2011-11-15 MED ORDER — CARVEDILOL 12.5 MG PO TABS: ORAL_TABLET | ORAL | Status: DC

## 2011-11-15 NOTE — Patient Instructions (Addendum)
Increase coreg(carvedilol) to 18.75mg  two times a day. This will be one and one-half 12.5mg  tablets two times a day.  Your physician recommends that you have  lab work today--BMET/BNP  Your physician has requested that you have an echocardiogram. Echocardiography is a painless test that uses sound waves to create images of your heart. It provides your doctor with information about the size and shape of your heart and how well your heart's chambers and valves are working. This procedure takes approximately one hour. There are no restrictions for this procedure. In 2-3 weeks.  Your physician recommends that you schedule a follow-up appointment in: 3 months with Dr Shirlee Latch.

## 2011-11-16 LAB — BASIC METABOLIC PANEL
BUN: 14 mg/dL (ref 6–23)
Creatinine, Ser: 1.1 mg/dL (ref 0.4–1.5)
GFR: 87.54 mL/min (ref >60.00)
Glucose, Bld: 101 mg/dL — ABNORMAL HIGH (ref 70–99)
Potassium: 3.9 mEq/L (ref 3.5–5.1)

## 2011-11-16 LAB — BRAIN NATRIURETIC PEPTIDE: Pro B Natriuretic peptide (BNP): 32 pg/mL (ref 0.0–100.0)

## 2011-11-17 NOTE — Progress Notes (Signed)
Patient ID: Ethan Wilcox, male   DOB: 11/30/1955, 56 y.o.   MRN: [id number] PCP: Dr. Julio Sicks  56 yo with history of nonischemic cardiomyopathy presents for cardiology followup. Ethan Wilcox had a prolonged admission in 5/11 for CHF. Echo showed EF 15-20% with diffuse hypokinesis. Left and right heart cath showed no angiographic CAD and Fick CI 1.47. Patient became hypotensive with ARF (cardiogenic shock) requiring milrinone and dopamine. He was gradually titrated off these medications and begun on an oral regimen.  He saw Dr. Ladona Ridgel regarding an ICD but actually at that time admitted to resuming ETOH intake. Therefore, no ICD yet.  He says he has cut back to 1/2 pint liquor twice a week.    Ethan Wilcox is doing fairly well.  He can walk about 100 yards before getting short of breath.  This is an improvement.  He is using his CPAP.  He can climb a flight of steps without much problem.  No chest pain.  He rarely feels palpitations.  His weight is down 11 lbs.  He is still smoking occasionally.   Labs (5/11): K 4.4, creatinine 1.23, LDL 104, HDL 33  Labs (6/11): BNP 97, K 4.8, creatinine 1.4  Labs (7/11): K 4.8, creatinine 1.0, BNP 53  Labs (10/11): BNP 25, K 4.8 => 4.3, creatinine 1.4 => 1.3  Labs (12/11): BNP 25, K 4.9, creatinine 1.2  Labs (3/12): K 4.3, creatinine 1.2 Labs (11/12): K 4, creatinine 1.3, BNP 82, TGs 518, HDL 39, HDL 60 Labs (2/13): AST 72, ALT 96 Labs (4/13): K 4.4, creatinine 0.95, proBNP 185 Labs (5/13): K 4.3, creatinine 1.2, BNP 32 Labs (7/13): K 4.1, creatinine 1.2, BNP 15 Labs (8/13): K 4.6, creatinine 1.0  ECG: NSR with ventricular bigeminy  Allergies (verified):  1) ! Penicillin   Past Medical History:  1. Nonischemic Cardiomyopathy: Admitted 5/11 with acute systolic CHF. LHC showed no angiographic CAD. Echo (5/11) showed EF 15-20% with diffuse severe hypokinesis, moderate Ethan. RHC showed PCWP 10, CI 1.47 (Fick). Cause of CMP thought to be heavy ETOH and cocaine. No ICD  yet due to ongoing ETOH intake.  2. ETOH abuse 3. Cocaine abuse: Quit in 2011  4. Active smoker: 1/2 ppd  5. Obesity  6. OSA: Severe  Family History:  "Heart disease" in mother   Social History:  Worked as [employer]er. Smokes 1/2 ppd. 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 Heavy ETOH in the past, now drinking 1/2 pint twice a week.   ROS: All systems reviewed and negative except as per HPI.    Current Outpatient Prescriptions  Medication Sig Dispense Refill  . albuterol (PROVENTIL HFA) 108 (90 BASE) MCG/ACT inhaler Inhale 2 puffs into the lungs every 4 (four) hours as needed. For shortness of breath      . aspirin (ASPIR-LOW) 81 MG EC tablet Take 81 mg by mouth daily.       . furosemide (LASIX) 40 MG tablet Take 20-40 mg by mouth 2 (two) times daily. 40 mg in a.m. And 20 mg in afternoon      . isosorbide-hydrALAZINE (BIDIL) 20-37.5 MG per tablet Take 0.5 tablets by mouth 3 (three) times daily.      [name] lidocaine (XYLOCAINE JELLY) 2 % jelly Apply topically as needed.  30 mL  0  . lisinopril (PRINIVIL,ZESTRIL) 10 MG tablet 1 and 1/2 tablets (total 15mg ) twice a day  90 tablet  6  . Multiple Vitamins-Minerals (MULTIVITAL) tablet Take 2 tablets by mouth daily.       [name]  omega-3 acid ethyl esters (LOVAZA) 1 G capsule Take 2 g by mouth 2 (two) times daily.      [name] omeprazole (PRILOSEC) 20 MG capsule Take 20 mg by mouth 2 (two) times daily.      [name] spironolactone (ALDACTONE) 25 MG tablet Take 25 mg by mouth daily.      . carvedilol (COREG) 12.5 MG tablet 1 and 1/2 tablets (total 18.75mg ) two times a day  90 tablet  6    BP 116/72  Pulse 94  Ht 5\' 7"  (1.702 m)  Wt 242 lb (109.77 kg)  BMI 37.90 kg/m2 General: NAD, obese Neck: Thick, JVP difficult, no thyromegaly or thyroid nodule.  Lungs: Decreased breath sounds at bases.  CV: Nondisplaced PMI.  Heart regular S1/S2, no S3/S4, no murmur.  No peripheral edema.  No carotid bruit.  Normal pedal pulses.  Abdomen: Soft, nontender, no hepatosplenomegaly, no  distention.  Neurologic: Alert and oriented x 3.  Psych: Normal affect. Extremities: No clubbing or cyanosis.   Assessment/Plan:  CHRONIC SYSTOLIC HEART FAILURE  Nonischemic cardiomyopathy, possibly ETOH-related. He does not appear volume overloaded but exam is difficult given body habitus. Weight is down and symptoms, though still NYHA class III, are improved.  - Continue current doses of lisinopril, spironolactone, and Bidil. He has not tolerated uptitration of Bidil due to headache.  - Continue Lasix 40 qam, 20 qpm.  - Not candidate for ICD until he can quit drinking => he has made some progress and is cutting back.  - Increase Coreg to 18.75 mg bid.  Hopefully this will help with ventricular ectopy.   - BMET/BNP today.  - Still needs to work on diet. Cut out soup unless it is specifically low sodium.  - Will repeat echo in a couple of weeks after Coreg uptitration to see if we are making progress with his LV systolic function.  OSA (obstructive sleep apnea)  Wearing CPAP, doing better. TOBACCO ABUSE I counselled him again to quit smoking.   [name] 11/17/2011

## 2011-11-29 ENCOUNTER — Other Ambulatory Visit (HOSPITAL_COMMUNITY): Payer: Medicaid Other

## 2011-12-17 ENCOUNTER — Other Ambulatory Visit: Payer: Self-pay | Admitting: Cardiology

## 2012-01-01 ENCOUNTER — Other Ambulatory Visit: Payer: Self-pay | Admitting: Gastroenterology

## 2012-01-01 DIAGNOSIS — K6289 Other specified diseases of anus and rectum: Secondary | ICD-10-CM

## 2012-01-01 DIAGNOSIS — K625 Hemorrhage of anus and rectum: Secondary | ICD-10-CM

## 2012-01-09 ENCOUNTER — Other Ambulatory Visit: Payer: Self-pay | Admitting: Gastroenterology

## 2012-01-09 ENCOUNTER — Other Ambulatory Visit: Payer: Medicaid Other

## 2012-01-09 DIAGNOSIS — K6289 Other specified diseases of anus and rectum: Secondary | ICD-10-CM

## 2012-01-12 ENCOUNTER — Other Ambulatory Visit: Payer: Medicaid Other

## 2012-01-12 ENCOUNTER — Ambulatory Visit
Admission: RE | Admit: 2012-01-12 | Discharge: 2012-01-12 | Disposition: A | Discharge status: Home / Self Care | Payer: Medicaid Other | Source: Ambulatory Visit | Attending: Gastroenterology | Admitting: Gastroenterology

## 2012-01-12 DIAGNOSIS — K6289 Other specified diseases of anus and rectum: Secondary | ICD-10-CM

## 2012-01-12 MED ORDER — IOHEXOL 300 MG/ML  SOLN
125.0000 mL | Freq: Once | INTRAMUSCULAR | Status: AC | PRN
  Administered 2012-01-12: 125 mL via INTRAVENOUS

## 2012-02-21 ENCOUNTER — Ambulatory Visit: Payer: Medicaid Other | Admitting: Cardiology

## 2012-03-06 ENCOUNTER — Telehealth: Payer: Self-pay | Admitting: Cardiology

## 2012-03-06 NOTE — Telephone Encounter (Signed)
Spoke with patient he stated he has had cramping in chest and abdomen for the last 2 days.States he has been working around the house more than normally.States he put up a ceiling fan.Advised to see his PCP.Patient was also told he missed his appointment 02/21/12 with Dr.McLean.Message sent to Dr.McLean's nurse to schedule appointment.

## 2012-03-06 NOTE — Telephone Encounter (Signed)
New Problem   Cramping up all over his body (mostly from chest to his stomach). Concerned and would like to speak to nurse.

## 2012-03-08 NOTE — Telephone Encounter (Signed)
I advised pt if cramping in his chest symptoms changed or he had them with exertion or at rest he should report to ED.

## 2012-03-08 NOTE — Telephone Encounter (Signed)
Spoke with pt. Pt states he has had cramping in his abdomen and chest. He states he has been constipated and had a little diarrhea. He states he is not constipated now and his abdominal cramping is better. He states he has cramping in his upper chest also . This is related to lifting his arms or moving a certain way. He does not have cramping in his chest at rest. Pt missed his 02/21/12 appt with Dr Shirlee Latch.  I have given pt an appt with NP 03/11/12.

## 2012-03-11 ENCOUNTER — Ambulatory Visit: Payer: Medicaid Other | Admitting: Nurse Practitioner

## 2012-03-18 ENCOUNTER — Ambulatory Visit: Payer: Medicaid Other | Admitting: Nurse Practitioner

## 2012-03-28 ENCOUNTER — Encounter: Payer: Self-pay | Admitting: Cardiology

## 2012-04-02 ENCOUNTER — Other Ambulatory Visit: Payer: Self-pay | Admitting: Cardiology

## 2012-04-05 ENCOUNTER — Ambulatory Visit: Payer: Self-pay | Admitting: Cardiology

## 2012-05-01 ENCOUNTER — Encounter: Payer: Self-pay | Admitting: Cardiology

## 2012-05-01 ENCOUNTER — Ambulatory Visit (INDEPENDENT_AMBULATORY_CARE_PROVIDER_SITE_OTHER): Payer: Medicaid Other | Admitting: Cardiology

## 2012-05-01 VITALS — BP 124/72 | HR 84 | Ht 67.0 in | Wt 250.0 lb

## 2012-05-01 DIAGNOSIS — G4733 Obstructive sleep apnea (adult) (pediatric): Secondary | ICD-10-CM

## 2012-05-01 DIAGNOSIS — F17200 Nicotine dependence, unspecified, uncomplicated: Secondary | ICD-10-CM

## 2012-05-01 DIAGNOSIS — I5022 Chronic systolic (congestive) heart failure: Secondary | ICD-10-CM

## 2012-05-01 DIAGNOSIS — F1010 Alcohol abuse, uncomplicated: Secondary | ICD-10-CM

## 2012-05-01 DIAGNOSIS — R0602 Shortness of breath: Secondary | ICD-10-CM

## 2012-05-01 LAB — BASIC METABOLIC PANEL
BUN: 13 mg/dL (ref 6–23)
Calcium: 9.4 mg/dL (ref 8.4–10.5)
Chloride: 102 mEq/L (ref 96–112)
Creatinine, Ser: 1.2 mg/dL (ref 0.4–1.5)

## 2012-05-01 LAB — BRAIN NATRIURETIC PEPTIDE: Pro B Natriuretic peptide (BNP): 28 pg/mL (ref 0.0–100.0)

## 2012-05-01 MED ORDER — CARVEDILOL 25 MG PO TABS: 25.0000 mg | ORAL_TABLET | Freq: Two times a day (BID) | ORAL | Status: DC

## 2012-05-01 NOTE — Patient Instructions (Addendum)
Increase coreg(carvedilol) to 25mg  two times a day.  Your physician recommends that you have  lab work today--BMET/BNP.  Your physician has requested that you have an echocardiogram. Echocardiography is a painless test that uses sound waves to create images of your heart. It provides your doctor with information about the size and shape of your heart and how well your heart's chambers and valves are working. This procedure takes approximately one hour. There are no restrictions for this procedure.  Your physician recommends that you schedule a follow-up appointment in: 3 months with Dr Shirlee Latch.  Dr Shirlee Latch recommends that you stop smoking and drinking.

## 2012-05-02 NOTE — Progress Notes (Signed)
Patient ID: Ethan Wilcox, male   DOB: Dec 20, 1955, 57 y.o.   MRN: [id number] PCP: Dr. Julio Sicks  57 yo with history of nonischemic cardiomyopathy presents for cardiology followup. Mr. Petrich had a prolonged admission in 5/11 for CHF. Echo showed EF 15-20% with diffuse hypokinesis. Left and right heart cath showed no angiographic CAD and Fick CI 1.47. Patient became hypotensive with ARF (cardiogenic shock) requiring milrinone and dopamine. He was gradually titrated off these medications and begun on an oral regimen.  He saw Dr. Ladona Ridgel regarding an ICD but actually at that time admitted to resuming ETOH intake. Therefore, no ICD yet.  He says he has cut back to 1/2 pint liquor twice a week.  He still smokes 1/2 ppd.  Mr Duva is doing fairly well.  He can walk about 100 yards before getting short of breath.  This is an improvement.  He is using his CPAP.  He can climb a flight of steps without much problem.  No chest pain.  He rarely feels palpitations.  His weight is up 8 lbs.  His diet is poor, high in salt and fried foods.    Labs (5/11): K 4.4, creatinine 1.23, LDL 104, HDL 33  Labs (6/11): BNP 97, K 4.8, creatinine 1.4  Labs (7/11): K 4.8, creatinine 1.0, BNP 53  Labs (10/11): BNP 25, K 4.8 => 4.3, creatinine 1.4 => 1.3  Labs (12/11): BNP 25, K 4.9, creatinine 1.2  Labs (3/12): K 4.3, creatinine 1.2 Labs (11/12): K 4, creatinine 1.3, BNP 82, TGs 518, HDL 39, HDL 60 Labs (2/13): AST 72, ALT 96 Labs (4/13): K 4.4, creatinine 0.95, proBNP 185 Labs (5/13): K 4.3, creatinine 1.2, BNP 32 Labs (7/13): K 4.1, creatinine 1.2, BNP 15 Labs (8/13): K 4.6, creatinine 1.0 Labs (11/13): K 3.9, creatinine 1.1, BNP 32  Allergies (verified):  1) ! Penicillin   Past Medical History:  1. Nonischemic Cardiomyopathy: Admitted 5/11 with acute systolic CHF. LHC showed no angiographic CAD. Echo (5/11) showed EF 15-20% with diffuse severe hypokinesis, moderate MR. RHC showed PCWP 10, CI 1.47 (Fick). Cause of CMP  thought to be heavy ETOH and cocaine. No ICD yet due to ongoing ETOH intake.  2. ETOH abuse 3. Cocaine abuse: Quit in 2011  4. Active smoker: 1/2 ppd  5. Obesity  6. OSA: Severe  Family History:  "Heart disease" in mother   Social History:  Worked as [employer]. Smokes 1/2 ppd. 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 Heavy ETOH in the past, now drinking 1/2 pint twice a week.   ROS: All systems reviewed and negative except as per HPI.    Current Outpatient Prescriptions  Medication Sig Dispense Refill  . acetaminophen (TYLENOL) 500 MG tablet Take 500 mg by mouth every 6 (six) hours as needed for pain.      [name] albuterol (PROVENTIL HFA) 108 (90 BASE) MCG/ACT inhaler Inhale 2 puffs into the lungs every 4 (four) hours as needed. For shortness of breath      . aspirin (ASPIR-LOW) 81 MG EC tablet Take 81 mg by mouth daily.       [name] BIDIL 20-37.5 MG per tablet TAKE 1/2 TABLET BY MOUTH THREE TIMES DAILY  45 tablet  2  . diphenhydramine-acetaminophen (TYLENOL PM) 25-500 MG TABS Take 1 tablet by mouth at bedtime as needed.      . furosemide (LASIX) 40 MG tablet Take 20-40 mg by mouth 2 (two) times daily. 40 mg in a.m. And 20 mg in afternoon      .  isosorbide-hydrALAZINE (BIDIL) 20-37.5 MG per tablet Take 0.5 tablets by mouth 3 (three) times daily.      [name] lidocaine (XYLOCAINE JELLY) 2 % jelly Apply topically as needed.  30 mL  0  . lisinopril (PRINIVIL,ZESTRIL) 10 MG tablet 1 and 1/2 tablets (total 15mg ) twice a day  90 tablet  6  . MULTIPLE VITAMIN PO Take by mouth daily.      [name] omega-3 acid ethyl esters (LOVAZA) 1 G capsule Take 2 g by mouth 2 (two) times daily.      [name] omeprazole (PRILOSEC) 20 MG capsule Take 20 mg by mouth 2 (two) times daily.      [name] spironolactone (ALDACTONE) 25 MG tablet Take 25 mg by mouth daily.      [name] spironolactone (ALDACTONE) 25 MG tablet TAKE 1 TABLET BY MOUTH EVERY DAY  30 tablet  3  . carvedilol (COREG) 25 MG tablet Take 1 tablet (25 mg total) by mouth 2 (two) times daily.  60  tablet  6   No current facility-administered medications for this visit.    BP 124/72  Pulse 84  Ht 5\' 7"  (1.702 m)  Wt 250 lb (113.399 kg)  BMI 39.15 kg/m2  SpO2 98% General: NAD, obese Neck: Thick, JVP difficult, no thyromegaly or thyroid nodule.  Lungs: Decreased breath sounds at bases.  CV: Nondisplaced PMI.  Heart regular S1/S2, no S3/S4, no murmur.  No peripheral edema.  No carotid bruit.  Normal pedal pulses.  Abdomen: Soft, nontender, no hepatosplenomegaly, no distention.  Neurologic: Alert and oriented x 3.  Psych: Normal affect. Extremities: No clubbing or cyanosis.   Assessment/Plan:  CHRONIC SYSTOLIC HEART FAILURE  Nonischemic cardiomyopathy, possibly ETOH-related. He does not appear volume overloaded but exam is difficult given body habitus. NYHA class II-III symptoms.  I think that his weight gain may have more to do with poor diet increased caloric intake than fluid retention. - Continue current doses of lisinopril, spironolactone, and Bidil. He has not tolerated uptitration of Bidil due to headache.  - Continue Lasix 40 qam, 20 qpm.  - Not candidate for ICD until he can quit drinking => I reiterated this today.  - Increase Coreg to 25 mg bid.   - BMET/BNP today.  - Still needs to work on diet. Cut out high sodium foods.  - Will repeat echo to see if we are making progress with his LV systolic function.  OSA (obstructive sleep apnea)  Wearing CPAP, doing better. TOBACCO ABUSE I counselled him again to quit smoking.   [name] 05/02/2012

## 2012-05-06 ENCOUNTER — Other Ambulatory Visit: Payer: Self-pay | Admitting: Cardiology

## 2012-05-07 ENCOUNTER — Other Ambulatory Visit: Payer: Self-pay | Admitting: Cardiology

## 2012-05-08 ENCOUNTER — Ambulatory Visit (HOSPITAL_COMMUNITY): Payer: Medicaid Other | Attending: Cardiovascular Disease | Admitting: Radiology

## 2012-05-08 DIAGNOSIS — G4733 Obstructive sleep apnea (adult) (pediatric): Secondary | ICD-10-CM | POA: Insufficient documentation

## 2012-05-08 DIAGNOSIS — E669 Obesity, unspecified: Secondary | ICD-10-CM | POA: Insufficient documentation

## 2012-05-08 DIAGNOSIS — R0602 Shortness of breath: Secondary | ICD-10-CM

## 2012-05-08 DIAGNOSIS — R609 Edema, unspecified: Secondary | ICD-10-CM | POA: Insufficient documentation

## 2012-05-08 DIAGNOSIS — I509 Heart failure, unspecified: Secondary | ICD-10-CM | POA: Insufficient documentation

## 2012-05-08 DIAGNOSIS — F1010 Alcohol abuse, uncomplicated: Secondary | ICD-10-CM | POA: Insufficient documentation

## 2012-05-08 DIAGNOSIS — I428 Other cardiomyopathies: Secondary | ICD-10-CM | POA: Insufficient documentation

## 2012-05-08 DIAGNOSIS — I5022 Chronic systolic (congestive) heart failure: Secondary | ICD-10-CM | POA: Insufficient documentation

## 2012-05-08 DIAGNOSIS — F17200 Nicotine dependence, unspecified, uncomplicated: Secondary | ICD-10-CM | POA: Insufficient documentation

## 2012-05-08 NOTE — Progress Notes (Signed)
Echocardiogram performed.  

## 2012-05-14 ENCOUNTER — Telehealth: Payer: Self-pay | Admitting: *Deleted

## 2012-05-14 NOTE — Telephone Encounter (Signed)
Message copied by Tarri Fuller on Tue May 14, 2012 10:49 AM ------      Message from: Laurey Morale      Created: Sun May 12, 2012  9:12 PM       EF remains low, 25-30% ------

## 2012-05-14 NOTE — Telephone Encounter (Signed)
pt notified about echo results with verbal understanding and advised to make sure to keep appt 7/22 w/[place], pt said ok and thank you.

## 2012-05-20 ENCOUNTER — Other Ambulatory Visit: Payer: Self-pay | Admitting: Cardiology

## 2012-07-28 ENCOUNTER — Emergency Department (HOSPITAL_COMMUNITY): Payer: Medicaid Other

## 2012-07-28 ENCOUNTER — Encounter (HOSPITAL_COMMUNITY): Payer: Self-pay | Admitting: Emergency Medicine

## 2012-07-28 DIAGNOSIS — Z7982 Long term (current) use of aspirin: Secondary | ICD-10-CM | POA: Insufficient documentation

## 2012-07-28 DIAGNOSIS — I509 Heart failure, unspecified: Secondary | ICD-10-CM | POA: Insufficient documentation

## 2012-07-28 DIAGNOSIS — Z8679 Personal history of other diseases of the circulatory system: Secondary | ICD-10-CM | POA: Insufficient documentation

## 2012-07-28 DIAGNOSIS — M109 Gout, unspecified: Secondary | ICD-10-CM | POA: Insufficient documentation

## 2012-07-28 DIAGNOSIS — K219 Gastro-esophageal reflux disease without esophagitis: Secondary | ICD-10-CM | POA: Insufficient documentation

## 2012-07-28 DIAGNOSIS — R059 Cough, unspecified: Secondary | ICD-10-CM | POA: Insufficient documentation

## 2012-07-28 DIAGNOSIS — J029 Acute pharyngitis, unspecified: Secondary | ICD-10-CM | POA: Insufficient documentation

## 2012-07-28 DIAGNOSIS — Z79899 Other long term (current) drug therapy: Secondary | ICD-10-CM | POA: Insufficient documentation

## 2012-07-28 DIAGNOSIS — Z8659 Personal history of other mental and behavioral disorders: Secondary | ICD-10-CM | POA: Insufficient documentation

## 2012-07-28 DIAGNOSIS — Z880 Allergy status to penicillin: Secondary | ICD-10-CM | POA: Insufficient documentation

## 2012-07-28 DIAGNOSIS — R05 Cough: Secondary | ICD-10-CM | POA: Insufficient documentation

## 2012-07-28 DIAGNOSIS — E669 Obesity, unspecified: Secondary | ICD-10-CM | POA: Insufficient documentation

## 2012-07-28 DIAGNOSIS — I10 Essential (primary) hypertension: Secondary | ICD-10-CM | POA: Insufficient documentation

## 2012-07-28 DIAGNOSIS — F17200 Nicotine dependence, unspecified, uncomplicated: Secondary | ICD-10-CM | POA: Insufficient documentation

## 2012-07-28 NOTE — ED Notes (Addendum)
C/o sore throat and productive cough with yellow sputum x 4-5 days. Denies sob.

## 2012-07-29 ENCOUNTER — Emergency Department (HOSPITAL_COMMUNITY)
Admission: EM | Admit: 2012-07-29 | Discharge: 2012-07-29 | Disposition: A | Discharge status: Home / Self Care | Payer: Medicaid Other | Attending: Emergency Medicine | Admitting: Emergency Medicine

## 2012-07-29 DIAGNOSIS — R05 Cough: Secondary | ICD-10-CM

## 2012-07-29 DIAGNOSIS — J029 Acute pharyngitis, unspecified: Secondary | ICD-10-CM

## 2012-07-29 LAB — RAPID STREP SCREEN (MED CTR MEBANE ONLY): Streptococcus, Group A Screen (Direct): NEGATIVE

## 2012-07-29 MED ORDER — GUAIFENESIN 100 MG/5ML PO LIQD: 100.0000 mg | ORAL | Status: DC | PRN

## 2012-07-29 MED ORDER — ACETAMINOPHEN-CODEINE #3 300-30 MG PO TABS: 1.0000 | ORAL_TABLET | Freq: Four times a day (QID) | ORAL | Status: DC | PRN

## 2012-07-29 NOTE — ED Provider Notes (Signed)
Medical screening examination/treatment/procedure(s) were performed by non-physician practitioner and as supervising physician I was immediately available for consultation/collaboration.  Brighten Buzzelli M Marinell Igarashi, MD 07/29/12 0643 

## 2012-07-29 NOTE — ED Provider Notes (Signed)
History    CSN: [id number] Arrival date & time 07/28/12  2326  None    Chief Complaint  Patient presents with  . Sore Throat  . Cough   (Consider location/radiation/quality/duration/timing/severity/associated sxs/prior Treatment) HPI Comments: Patient with sore throat non productive cough for the past 4 days  Denies fever, nasal congestion, SOB   Patient is a 57 y.o. male presenting with pharyngitis and cough. The history is provided by the patient.  Sore Throat This is a new problem. The current episode started in the past 7 days. The problem has been unchanged. Associated symptoms include coughing and a sore throat. Pertinent negatives include no chills, congestion, fever, myalgias, nausea or rash. Nothing aggravates the symptoms. He has tried nothing for the symptoms. The treatment provided no relief.  Cough Associated symptoms: sore throat   Associated symptoms: no chills, no fever, no myalgias, no rash and no shortness of breath    Past Medical History  Diagnosis Date  . Nonischemic cardiomyopathy     admitted 5/11 w acute systolic CHF. LHC showed no angiographic CAD. Echo (5/11) showed EF 15-20% w diffuse sever hypokinesis, moderate MR> RHC showed PCWP 10, CI 1.47 (Fick.) cause of CMP thought to be heavy ETOH and cocaine. No ICD yet due to ongoing ETOH intake  . ETOH abuse   . Cocaine abuse   . Active smoker /    1/2 ppd  . Obesity   . Hypertension   . CHF (congestive heart failure)   . Gout   . GERD (gastroesophageal reflux disease)    Past Surgical History  Procedure Laterality Date  . No past surgeries     Family History  Problem Relation Age of Onset  . Heart disease Mother   . Cancer Mother    History  Substance Use Topics  . Smoking status: Current Every Day Smoker -- 0.50 packs/day for 38 years  . Smokeless tobacco: Not on file  . Alcohol Use: Yes     Comment: Vodka 1/2 pint 3 days a week    Review of Systems  Constitutional: Negative for fever and  chills.  HENT: Positive for sore throat. Negative for congestion and trouble swallowing.   Respiratory: Positive for cough. Negative for shortness of breath.   Gastrointestinal: Negative for nausea.  Musculoskeletal: Negative for myalgias.  Skin: Negative for rash.  All other systems reviewed and are negative.    Allergies  Penicillins  Home Medications   Current Outpatient Rx  Name  Route  Sig  Dispense  Refill  . albuterol (PROVENTIL HFA) 108 (90 BASE) MCG/ACT inhaler   Inhalation   Inhale 2 puffs into the lungs every 4 (four) hours as needed. For shortness of breath         . aspirin (ASPIR-LOW) 81 MG EC tablet   Oral   Take 81 mg by mouth daily.          . carvedilol (COREG) 25 MG tablet   Oral   Take 1 tablet (25 mg total) by mouth 2 (two) times daily.   60 tablet   6   . diphenhydramine-acetaminophen (TYLENOL PM) 25-500 MG TABS   Oral   Take 1 tablet by mouth every 6 (six) hours.          . furosemide (LASIX) 40 MG tablet   Oral   Take 20-40 mg by mouth See admin instructions. 40 mg in a.m. And 20 mg in afternoon         .  isosorbide-hydrALAZINE (BIDIL) 20-37.5 MG per tablet   Oral   Take 0.5 tablets by mouth 3 (three) times daily.         [name] lisinopril (PRINIVIL,ZESTRIL) 10 MG tablet   Oral   Take 15 mg by mouth 2 (two) times daily. 1 and 1/2 tablets (total 15mg ) twice a day         . Multiple Vitamin (MULTIVITAMIN WITH MINERALS) TABS   Oral   Take 1 tablet by mouth daily.         [name] omega-3 acid ethyl esters (LOVAZA) 1 G capsule   Oral   Take 2 g by mouth 2 (two) times daily.         [name] omeprazole (PRILOSEC) 20 MG capsule   Oral   Take 20 mg by mouth 2 (two) times daily.         [name] spironolactone (ALDACTONE) 25 MG tablet   Oral   Take 25 mg by mouth daily.         [name] acetaminophen-codeine (TYLENOL #3) 300-30 MG per tablet   Oral   Take 1-2 tablets by mouth every 6 (six) hours as needed for pain.   15 tablet   0   .  guaiFENesin (ROBITUSSIN) 100 MG/5ML liquid   Oral   Take 5-10 mLs (100-200 mg total) by mouth every 4 (four) hours as needed for cough.   60 mL   0    BP 139/111  Pulse 106  Temp(Src) 98.5 F (36.9 C) (Oral)  Resp 20  SpO2 97% Physical Exam  Nursing note and vitals reviewed. Constitutional: He appears well-developed and well-nourished.  HENT:  Head: Normocephalic.  Right Ear: External ear normal.  Left Ear: External ear normal.  Mouth/Throat: Uvula is midline. No edematous. Posterior oropharyngeal erythema present.  Eyes: Pupils are equal, round, and reactive to light.  Neck: Normal range of motion.  Cardiovascular: Normal rate.   Pulmonary/Chest: Effort normal. No respiratory distress. He has no wheezes.  Musculoskeletal: Normal range of motion.  Neurological: He is alert.  Skin: Skin is warm.    ED Course  Procedures (including critical care time) Labs Reviewed  RAPID STREP SCREEN  CULTURE, GROUP A STREP   Dg Chest 2 View (if Patient Has Fever And/or Copd)  07/29/2012   *RADIOLOGY REPORT*  Clinical Data: Productive cough, sore throat, fever  CHEST - 2 VIEW  Comparison: Prior radiograph from 06/05/2011  Findings: Mild cardiomegaly is stable.  Mediastinal silhouette is within normal limits.  Lungs are normally inflated.  No parenchymal airspace opacity to suggest an acute infectious pneumonitis is identified.  Linear opacity within the right lung base most likely reflects atelectasis.  No pulmonary edema or pleural effusion.  No pneumothorax.  Osseous structures are within normal limits.  IMPRESSION: 1.  Right basilar atelectasis.  No focal airspace consolidation to suggest acute infectious pneumonitis identified. 2.  Stable cardiomegaly.   Original Report Authenticated By: Rise Mu, M.D.   1. Sore throat   2. Cough     MDM  Negative strep and normal chest xray    Arman Filter, NP 07/29/12 [phone]

## 2012-07-30 ENCOUNTER — Encounter: Payer: Self-pay | Admitting: Cardiology

## 2012-07-30 ENCOUNTER — Ambulatory Visit (INDEPENDENT_AMBULATORY_CARE_PROVIDER_SITE_OTHER): Payer: Medicaid Other | Admitting: Cardiology

## 2012-07-30 VITALS — BP 126/80 | HR 85 | Ht 67.0 in | Wt 248.0 lb

## 2012-07-30 DIAGNOSIS — I5022 Chronic systolic (congestive) heart failure: Secondary | ICD-10-CM

## 2012-07-30 DIAGNOSIS — F1010 Alcohol abuse, uncomplicated: Secondary | ICD-10-CM

## 2012-07-30 DIAGNOSIS — F17200 Nicotine dependence, unspecified, uncomplicated: Secondary | ICD-10-CM

## 2012-07-30 LAB — CULTURE, GROUP A STREP

## 2012-07-30 MED ORDER — LISINOPRIL 20 MG PO TABS: 20.0000 mg | ORAL_TABLET | Freq: Every day | ORAL | Status: DC

## 2012-07-30 NOTE — Progress Notes (Signed)
Patient ID: Ethan Wilcox, male   DOB: 29-Nov-1955, 57 y.o.   MRN: [id number] PCP: Dr. Julio Sicks  57 yo with history of nonischemic cardiomyopathy presents for cardiology followup. Ethan Wilcox had a prolonged admission in 5/11 for CHF. Echo showed EF 15-20% with diffuse hypokinesis. Left and right heart cath showed no angiographic CAD and Fick CI 1.47. Patient became hypotensive with ARF (cardiogenic shock) requiring milrinone and dopamine. He was gradually titrated off these medications and begun on an oral regimen.  He saw Dr. Ladona Ridgel regarding an ICD but actually at that time admitted to resuming ETOH intake. Therefore, no ICD yet.  He says he has cut back to about 3 liquor drinks a day.  He still smokes 1/2 ppd.  Last echo in 4/14 showed EF 25-30%, diffuse hypokinesis.   Ethan Wilcox is doing fairly well.  He can walk about 100 yards before getting short of breath.  This is stable. He is using his CPAP.  He can climb a flight of steps without much problem.  No chest pain.  He rarely feels palpitations.  His weight is down 2 lbs since last appointment.   Labs (5/11): K 4.4, creatinine 1.23, LDL 104, HDL 33  Labs (6/11): BNP 97, K 4.8, creatinine 1.4  Labs (7/11): K 4.8, creatinine 1.0, BNP 53  Labs (10/11): BNP 25, K 4.8 => 4.3, creatinine 1.4 => 1.3  Labs (12/11): BNP 25, K 4.9, creatinine 1.2  Labs (3/12): K 4.3, creatinine 1.2 Labs (11/12): K 4, creatinine 1.3, BNP 82, TGs 518, HDL 39, HDL 60 Labs (2/13): AST 72, ALT 96 Labs (4/13): K 4.4, creatinine 0.95, proBNP 185 Labs (5/13): K 4.3, creatinine 1.2, BNP 32 Labs (7/13): K 4.1, creatinine 1.2, BNP 15 Labs (8/13): K 4.6, creatinine 1.0 Labs (11/13): K 3.9, creatinine 1.1, BNP 32 Las (4/14): K 4.8, creatinine 1.2, BNP 28  ECG: NSR, PACs, QTc 499 msec  Allergies (verified):  1) ! Penicillin   Past Medical History:  1. Nonischemic Cardiomyopathy: Admitted 5/11 with acute systolic CHF. LHC showed no angiographic CAD. Echo (5/11) showed EF  15-20% with diffuse severe hypokinesis, moderate Ethan. RHC showed PCWP 10, CI 1.47 (Fick).  Echo (4/14) with EF 35%, diffuse hypokinesis.  Cause of CMP thought to be heavy ETOH and cocaine. No ICD yet due to ongoing ETOH intake.  2. ETOH abuse 3. Cocaine abuse: Quit in 2011  4. Active smoker: 1/2 ppd  5. Obesity  6. OSA: Severe  Family History:  "Heart disease" in mother   Social History:  Worked as [employer]. Smokes 1/2 ppd. 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 Heavy ETOH in the past, now drinking 3 liquor drinks a day.   ROS: All systems reviewed and negative except as per HPI.    Current Outpatient Prescriptions  Medication Sig Dispense Refill  . acetaminophen-codeine (TYLENOL #3) 300-30 MG per tablet Take 1-2 tablets by mouth every 6 (six) hours as needed for pain.  15 tablet  0  . albuterol (PROVENTIL HFA) 108 (90 BASE) MCG/ACT inhaler Inhale 2 puffs into the lungs every 4 (four) hours as needed. For shortness of breath      . aspirin (ASPIR-LOW) 81 MG EC tablet Take 81 mg by mouth daily.       . carvedilol (COREG) 25 MG tablet Take 1 tablet (25 mg total) by mouth 2 (two) times daily.  60 tablet  6  . diphenhydramine-acetaminophen (TYLENOL PM) 25-500 MG TABS Take 1 tablet by mouth every 6 (six)  hours.       . furosemide (LASIX) 40 MG tablet Take 20-40 mg by mouth See admin instructions. 40 mg in a.m. And 20 mg in afternoon      . isosorbide-hydrALAZINE (BIDIL) 20-37.5 MG per tablet Take 0.5 tablets by mouth 3 (three) times daily.      . Multiple Vitamin (MULTIVITAMIN WITH MINERALS) TABS Take 1 tablet by mouth daily.      [name] omega-3 acid ethyl esters (LOVAZA) 1 G capsule Take 2 g by mouth 2 (two) times daily.      [name] omeprazole (PRILOSEC) 20 MG capsule Take 20 mg by mouth 2 (two) times daily.      [name] spironolactone (ALDACTONE) 25 MG tablet Take 25 mg by mouth daily.      [name] lisinopril (PRINIVIL,ZESTRIL) 20 MG tablet Take 1 tablet (20 mg total) by mouth daily.  30 tablet  6   No current  facility-administered medications for this visit.    BP 126/80  Pulse 85  Ht 5\' 7"  (1.702 m)  Wt 112.492 kg (248 lb)  BMI 38.83 kg/m2 General: NAD, obese Neck: Thick, JVP difficult, no thyromegaly or thyroid nodule.  Lungs: Decreased breath sounds at bases.  CV: Nondisplaced PMI.  Heart regular S1/S2, no S3/S4, no murmur.  No peripheral edema.  No carotid bruit.  Normal pedal pulses.  Abdomen: Soft, nontender, no hepatosplenomegaly, no distention.  Neurologic: Alert and oriented x 3.  Psych: Normal affect. Extremities: No clubbing or cyanosis.   Assessment/Plan:  CHRONIC SYSTOLIC HEART FAILURE  Nonischemic cardiomyopathy, possibly ETOH-related. He does not appear volume overloaded but exam is difficult given body habitus. NYHA class II-III symptoms.  Weight is actually down.  Echo in 4/14 with EF 25-30%.  - Continue current doses of Coreg, spironolactone, and Bidil. He has not tolerated uptitration of Bidil due to headache.  - Increase lisinopril to 20 mg bid with BMET/BNP in 1 wk. - Continue Lasix 40 qam, 20 qpm.  - Not candidate for ICD until he can quit drinking => I reiterated this today.  - Still needs to work on diet. Cut out high sodium foods.  OSA  Wearing CPAP, doing better. TOBACCO ABUSE I counselled him again to quit smoking.   [name] 07/30/2012

## 2012-07-30 NOTE — Patient Instructions (Addendum)
Increase lisinopril to 20mg  daily. You can take 2 of your 10mg  tablets daily at the same time and use your current supply.  Your physician recommends that you return for lab work in: 1 week--BMET/BNP.  Your physician wants you to follow-up in: 4 months (November 2014). with Dr Shirlee Latch. This can be scheduled at the [facility] at [facility] with Dr Shirlee Latch. You will receive a reminder letter in the mail two months in advance. If you don't receive a letter, please call our office to schedule the follow-up appointment.

## 2012-08-06 ENCOUNTER — Other Ambulatory Visit: Payer: Medicaid Other

## 2012-08-12 ENCOUNTER — Other Ambulatory Visit: Payer: Self-pay | Admitting: Cardiology

## 2012-08-14 ENCOUNTER — Other Ambulatory Visit (INDEPENDENT_AMBULATORY_CARE_PROVIDER_SITE_OTHER): Payer: Medicaid Other

## 2012-08-14 DIAGNOSIS — I5022 Chronic systolic (congestive) heart failure: Secondary | ICD-10-CM

## 2012-08-14 LAB — BASIC METABOLIC PANEL
CO2: 24 mEq/L (ref 19–32)
Calcium: 9.6 mg/dL (ref 8.4–10.5)
Potassium: 4.3 mEq/L (ref 3.5–5.1)
Sodium: 136 mEq/L (ref 135–145)

## 2012-08-19 ENCOUNTER — Other Ambulatory Visit: Payer: Self-pay | Admitting: Cardiology

## 2012-08-22 ENCOUNTER — Telehealth: Payer: Self-pay | Admitting: Cardiology

## 2012-08-22 NOTE — Telephone Encounter (Signed)
New prob  Ethan Wilcox from [facility] is calling a form that they faxed over in June and July for this patient.

## 2012-08-22 NOTE — Telephone Encounter (Signed)
I am not in office.  Can someone complete and send back? Can see if it can be signed by DOD.

## 2012-08-22 NOTE — Telephone Encounter (Signed)
[facility] Leandra Kern) requesting followup on their request for form that was dropped off in person June 2014 and refaxed July 2014 - Certificate of Medical Necessity for CPAP.  States this is two months overdue and please fax completed form to: [phone] (fax).  Thank you!

## 2012-08-23 ENCOUNTER — Emergency Department (HOSPITAL_COMMUNITY): Payer: Medicaid Other

## 2012-08-23 ENCOUNTER — Observation Stay (HOSPITAL_COMMUNITY)
Admission: EM | Admit: 2012-08-23 | Discharge: 2012-08-24 | Disposition: A | Discharge status: Home / Self Care | Payer: Medicaid Other | Attending: Internal Medicine | Admitting: Internal Medicine

## 2012-08-23 ENCOUNTER — Encounter (HOSPITAL_COMMUNITY): Payer: Self-pay | Admitting: Emergency Medicine

## 2012-08-23 DIAGNOSIS — Z87892 Personal history of anaphylaxis: Secondary | ICD-10-CM

## 2012-08-23 DIAGNOSIS — R079 Chest pain, unspecified: Secondary | ICD-10-CM

## 2012-08-23 DIAGNOSIS — I509 Heart failure, unspecified: Secondary | ICD-10-CM | POA: Insufficient documentation

## 2012-08-23 DIAGNOSIS — I428 Other cardiomyopathies: Secondary | ICD-10-CM

## 2012-08-23 DIAGNOSIS — I5042 Chronic combined systolic (congestive) and diastolic (congestive) heart failure: Secondary | ICD-10-CM | POA: Diagnosis present

## 2012-08-23 DIAGNOSIS — T7809XA Anaphylactic reaction due to other food products, initial encounter: Secondary | ICD-10-CM | POA: Insufficient documentation

## 2012-08-23 DIAGNOSIS — I5023 Acute on chronic systolic (congestive) heart failure: Secondary | ICD-10-CM

## 2012-08-23 DIAGNOSIS — T782XXA Anaphylactic shock, unspecified, initial encounter: Secondary | ICD-10-CM

## 2012-08-23 DIAGNOSIS — F17200 Nicotine dependence, unspecified, uncomplicated: Secondary | ICD-10-CM | POA: Diagnosis present

## 2012-08-23 DIAGNOSIS — I5021 Acute systolic (congestive) heart failure: Secondary | ICD-10-CM

## 2012-08-23 DIAGNOSIS — N179 Acute kidney failure, unspecified: Secondary | ICD-10-CM

## 2012-08-23 DIAGNOSIS — F1010 Alcohol abuse, uncomplicated: Secondary | ICD-10-CM | POA: Diagnosis present

## 2012-08-23 DIAGNOSIS — X58XXXA Exposure to other specified factors, initial encounter: Secondary | ICD-10-CM | POA: Insufficient documentation

## 2012-08-23 DIAGNOSIS — I10 Essential (primary) hypertension: Secondary | ICD-10-CM | POA: Diagnosis present

## 2012-08-23 DIAGNOSIS — Z23 Encounter for immunization: Secondary | ICD-10-CM | POA: Insufficient documentation

## 2012-08-23 DIAGNOSIS — I5022 Chronic systolic (congestive) heart failure: Secondary | ICD-10-CM

## 2012-08-23 DIAGNOSIS — T783XXA Angioneurotic edema, initial encounter: Principal | ICD-10-CM

## 2012-08-23 DIAGNOSIS — Z79899 Other long term (current) drug therapy: Secondary | ICD-10-CM | POA: Insufficient documentation

## 2012-08-23 DIAGNOSIS — G4733 Obstructive sleep apnea (adult) (pediatric): Secondary | ICD-10-CM

## 2012-08-23 HISTORY — DX: Sleep apnea, unspecified: G47.30

## 2012-08-23 HISTORY — DX: Personal history of anaphylaxis: Z87.892

## 2012-08-23 LAB — CBC WITH DIFFERENTIAL/PLATELET
Basophils Absolute: 0 K/uL (ref 0.0–0.1)
Basophils Relative: 0 % (ref 0–1)
Eosinophils Absolute: 0.1 K/uL (ref 0.0–0.7)
Eosinophils Relative: 2 % (ref 0–5)
HCT: 45.9 % (ref 39.0–52.0)
Hemoglobin: 15.5 g/dL (ref 13.0–17.0)
Lymphocytes Relative: 37 % (ref 12–46)
Lymphs Abs: 3.1 10*3/uL (ref 0.7–4.0)
MCH: 30.6 pg (ref 26.0–34.0)
MCHC: 33.8 g/dL (ref 30.0–36.0)
MCV: 90.7 fL (ref 78.0–100.0)
Monocytes Absolute: 0.3 K/uL (ref 0.1–1.0)
Monocytes Relative: 4 % (ref 3–12)
Neutro Abs: 4.7 K/uL (ref 1.7–7.7)
Neutrophils Relative %: 57 % (ref 43–77)
Platelets: 271 10*3/uL (ref 150–400)
RBC: 5.06 MIL/uL (ref 4.22–5.81)
RDW: 13.9 % (ref 11.5–15.5)
WBC: 8.2 10*3/uL (ref 4.0–10.5)

## 2012-08-23 LAB — COMPREHENSIVE METABOLIC PANEL WITH GFR
AST: 22 U/L (ref 0–37)
Albumin: 3.6 g/dL (ref 3.5–5.2)
BUN: 22 mg/dL (ref 6–23)
Calcium: 9.2 mg/dL (ref 8.4–10.5)
Chloride: 102 meq/L (ref 96–112)
Creatinine, Ser: 1.39 mg/dL — ABNORMAL HIGH (ref 0.50–1.35)
Total Bilirubin: 0.4 mg/dL (ref 0.3–1.2)
Total Protein: 6.5 g/dL (ref 6.0–8.3)

## 2012-08-23 LAB — COMPREHENSIVE METABOLIC PANEL
ALT: 32 U/L (ref 0–53)
Alkaline Phosphatase: 77 U/L (ref 39–117)
CO2: 19 mEq/L (ref 19–32)
GFR calc Af Amer: 64 mL/min — ABNORMAL LOW (ref >90)
GFR calc non Af Amer: 56 mL/min — ABNORMAL LOW (ref >90)
Glucose, Bld: 148 mg/dL — ABNORMAL HIGH (ref 70–99)
Potassium: 4.1 mEq/L (ref 3.5–5.1)
Sodium: 136 mEq/L (ref 135–145)

## 2012-08-23 MED ORDER — CARVEDILOL 25 MG PO TABS
25.0000 mg | ORAL_TABLET | Freq: Two times a day (BID) | ORAL | Status: DC
  Administered 2012-08-24 (×2): 25 mg via ORAL
  Filled 2012-08-23 (×3): qty 1

## 2012-08-23 MED ORDER — ISOSORB DINITRATE-HYDRALAZINE 20-37.5 MG PO TABS
1.0000 | ORAL_TABLET | Freq: Three times a day (TID) | ORAL | Status: DC
  Administered 2012-08-24 (×3): 1 via ORAL
  Filled 2012-08-23 (×4): qty 1

## 2012-08-23 MED ORDER — SPIRONOLACTONE 25 MG PO TABS
25.0000 mg | ORAL_TABLET | Freq: Every day | ORAL | Status: DC
  Administered 2012-08-24: 25 mg via ORAL
  Filled 2012-08-23: qty 1

## 2012-08-23 MED ORDER — SODIUM CHLORIDE 0.9 % IV SOLN: INTRAVENOUS | Status: DC

## 2012-08-23 MED ORDER — SODIUM CHLORIDE 0.9 % IJ SOLN
3.0000 mL | Freq: Two times a day (BID) | INTRAMUSCULAR | Status: DC
  Administered 2012-08-24 (×2): 3 mL via INTRAVENOUS

## 2012-08-23 MED ORDER — DIPHENHYDRAMINE HCL 50 MG/ML IJ SOLN
50.0000 mg | Freq: Once | INTRAMUSCULAR | Status: AC
  Administered 2012-08-23: 50 mg via INTRAVENOUS
  Filled 2012-08-23 (×2): qty 1

## 2012-08-23 MED ORDER — ASPIRIN 81 MG PO TBEC
81.0000 mg | DELAYED_RELEASE_TABLET | Freq: Every day | ORAL | Status: DC
  Administered 2012-08-24: 81 mg via ORAL
  Filled 2012-08-23: qty 1

## 2012-08-23 MED ORDER — ONDANSETRON HCL 4 MG PO TABS: 4.0000 mg | ORAL_TABLET | Freq: Four times a day (QID) | ORAL | Status: DC | PRN

## 2012-08-23 MED ORDER — OXYCODONE HCL 5 MG PO TABS: 5.0000 mg | ORAL_TABLET | ORAL | Status: DC | PRN

## 2012-08-23 MED ORDER — ALUM & MAG HYDROXIDE-SIMETH 200-200-20 MG/5ML PO SUSP: 30.0000 mL | Freq: Four times a day (QID) | ORAL | Status: DC | PRN

## 2012-08-23 MED ORDER — OMEGA-3-ACID ETHYL ESTERS 1 G PO CAPS
2.0000 g | ORAL_CAPSULE | Freq: Two times a day (BID) | ORAL | Status: DC
  Administered 2012-08-24 (×2): 2 g via ORAL
  Filled 2012-08-23 (×3): qty 2

## 2012-08-23 MED ORDER — ZOLPIDEM TARTRATE 5 MG PO TABS: 5.0000 mg | ORAL_TABLET | Freq: Every evening | ORAL | Status: DC | PRN

## 2012-08-23 MED ORDER — FUROSEMIDE 20 MG PO TABS
20.0000 mg | ORAL_TABLET | Freq: Two times a day (BID) | ORAL | Status: DC
  Filled 2012-08-23: qty 2

## 2012-08-23 MED ORDER — ALBUTEROL SULFATE (5 MG/ML) 0.5% IN NEBU: 2.5000 mg | INHALATION_SOLUTION | RESPIRATORY_TRACT | Status: DC | PRN

## 2012-08-23 MED ORDER — HYDROMORPHONE HCL PF 1 MG/ML IJ SOLN: 0.5000 mg | INTRAMUSCULAR | Status: DC | PRN

## 2012-08-23 MED ORDER — DEXAMETHASONE SODIUM PHOSPHATE 10 MG/ML IJ SOLN
10.0000 mg | Freq: Once | INTRAMUSCULAR | Status: AC
  Administered 2012-08-23: 10 mg via INTRAVENOUS
  Filled 2012-08-23 (×2): qty 1

## 2012-08-23 MED ORDER — ACETAMINOPHEN 650 MG RE SUPP: 650.0000 mg | Freq: Four times a day (QID) | RECTAL | Status: DC | PRN

## 2012-08-23 MED ORDER — ACETAMINOPHEN 325 MG PO TABS: 650.0000 mg | ORAL_TABLET | Freq: Four times a day (QID) | ORAL | Status: DC | PRN

## 2012-08-23 MED ORDER — ONDANSETRON HCL 4 MG/2ML IJ SOLN: 4.0000 mg | Freq: Four times a day (QID) | INTRAMUSCULAR | Status: DC | PRN

## 2012-08-23 MED ORDER — ADULT MULTIVITAMIN W/MINERALS CH
1.0000 | ORAL_TABLET | Freq: Every day | ORAL | Status: DC
  Filled 2012-08-23: qty 1

## 2012-08-23 MED ORDER — METHYLPREDNISOLONE SODIUM SUCC 125 MG IJ SOLR
60.0000 mg | Freq: Two times a day (BID) | INTRAMUSCULAR | Status: DC
  Administered 2012-08-24: 60 mg via INTRAVENOUS
  Filled 2012-08-23 (×2): qty 0.96

## 2012-08-23 MED ORDER — ENOXAPARIN SODIUM 40 MG/0.4ML ~~LOC~~ SOLN
40.0000 mg | SUBCUTANEOUS | Status: DC
  Administered 2012-08-24: 40 mg via SUBCUTANEOUS
  Filled 2012-08-23: qty 0.4

## 2012-08-23 MED ORDER — EPINEPHRINE HCL 1 MG/ML IJ SOLN: 1.0000 mg | Freq: Once | INTRAMUSCULAR | Status: DC

## 2012-08-23 MED ORDER — PANTOPRAZOLE SODIUM 40 MG PO TBEC
40.0000 mg | DELAYED_RELEASE_TABLET | Freq: Every day | ORAL | Status: DC
  Administered 2012-08-24: 40 mg via ORAL
  Filled 2012-08-23: qty 1

## 2012-08-23 MED ORDER — ALBUTEROL SULFATE (5 MG/ML) 0.5% IN NEBU
2.5000 mg | INHALATION_SOLUTION | Freq: Four times a day (QID) | RESPIRATORY_TRACT | Status: DC
  Administered 2012-08-24 (×2): 2.5 mg via RESPIRATORY_TRACT
  Filled 2012-08-23 (×2): qty 0.5

## 2012-08-23 MED ORDER — EPINEPHRINE 0.3 MG/0.3ML IJ SOAJ
INTRAMUSCULAR | Status: AC
  Administered 2012-08-23: 0.3 mg
  Filled 2012-08-23: qty 0.3

## 2012-08-23 MED ORDER — FAMOTIDINE IN NACL 20-0.9 MG/50ML-% IV SOLN
20.0000 mg | Freq: Once | INTRAVENOUS | Status: AC
  Administered 2012-08-23: 20 mg via INTRAVENOUS
  Filled 2012-08-23 (×2): qty 50

## 2012-08-23 NOTE — Telephone Encounter (Signed)
Requested form be refaxed, unable to locate.

## 2012-08-23 NOTE — ED Provider Notes (Signed)
CSN: [id number]     Arrival date & time 08/23/12  1855 History     First MD Initiated Contact with Patient 08/23/12 1912     Chief Complaint  Patient presents with  . Allergic Reaction   (Consider location/radiation/quality/duration/timing/severity/associated sxs/prior Treatment) Patient is a 57 y.o. male presenting with allergic reaction. The history is provided by the patient and a relative.  Allergic Reaction Presenting symptoms: difficulty breathing, difficulty swallowing, itching and swelling   Presenting symptoms: no rash and no wheezing   Swelling:    Location:  Face   Onset quality:  Gradual   Duration: 30 min. Severity:  Severe Prior episodes: none. Context: food   Relieved by:  None tried Worsened by:  Nothing tried Ineffective treatments:  None tried   Past Medical History  Diagnosis Date  . Nonischemic cardiomyopathy     admitted 5/11 w acute systolic CHF. LHC showed no angiographic CAD. Echo (5/11) showed EF 15-20% w diffuse sever hypokinesis, moderate MR> RHC showed PCWP 10, CI 1.47 (Fick.) cause of CMP thought to be heavy ETOH and cocaine. No ICD yet due to ongoing ETOH intake  . ETOH abuse   . Cocaine abuse   . Active smoker /    1/2 ppd  . Obesity   . Hypertension   . CHF (congestive heart failure)   . Gout   . GERD (gastroesophageal reflux disease)    Past Surgical History  Procedure Laterality Date  . No past surgeries     Family History  Problem Relation Age of Onset  . Heart disease Mother   . Cancer Mother    History  Substance Use Topics  . Smoking status: Current Every Day Smoker -- 0.50 packs/day for 38 years  . Smokeless tobacco: Not on file  . Alcohol Use: Yes     Comment: Vodka 1/2 pint 3 days a week    Review of Systems  Constitutional: Positive for diaphoresis. Negative for fever, activity change, appetite change and fatigue.  HENT: Positive for facial swelling, trouble swallowing and voice change. Negative for congestion, sore  throat, rhinorrhea, neck pain, neck stiffness and sinus pressure.   Eyes: Negative.   Respiratory: Positive for shortness of breath. Negative for cough, choking, chest tightness and wheezing.   Cardiovascular: Negative for chest pain.  Gastrointestinal: Negative for nausea, vomiting and abdominal pain.  Genitourinary: Negative for dysuria, urgency, frequency, hematuria, flank pain and difficulty urinating.  Musculoskeletal: Negative for back pain and gait problem.  Skin: Positive for itching. Negative for rash and wound.  Neurological: Negative for facial asymmetry, weakness, numbness and headaches.  Psychiatric/Behavioral: Negative for behavioral problems, confusion and agitation. The patient is not nervous/anxious and is not hyperactive.   All other systems reviewed and are negative.    Allergies  Penicillins  Home Medications   Current Outpatient Rx  Name  Route  Sig  Dispense  Refill  . acetaminophen-codeine (TYLENOL #3) 300-30 MG per tablet   Oral   Take 1-2 tablets by mouth every 6 (six) hours as needed for pain.   15 tablet   0   . albuterol (PROVENTIL HFA) 108 (90 BASE) MCG/ACT inhaler   Inhalation   Inhale 2 puffs into the lungs every 4 (four) hours as needed. For shortness of breath         . aspirin (ASPIR-LOW) 81 MG EC tablet   Oral   Take 81 mg by mouth daily.          . carvedilol (  COREG) 25 MG tablet   Oral   Take 1 tablet (25 mg total) by mouth 2 (two) times daily.   60 tablet   6   . diphenhydramine-acetaminophen (TYLENOL PM) 25-500 MG TABS   Oral   Take 1 tablet by mouth every 6 (six) hours.          . furosemide (LASIX) 40 MG tablet   Oral   Take 20-40 mg by mouth 2 (two) times daily. 40mg  in the morning and 20mg  in the evening.         . isosorbide-hydrALAZINE (BIDIL) 20-37.5 MG per tablet   Oral   Take 1 tablet by mouth 3 (three) times daily.         [name] lisinopril (PRINIVIL,ZESTRIL) 20 MG tablet   Oral   Take 1 tablet (20 mg total)  by mouth daily.   30 tablet   6   . Multiple Vitamin (MULTIVITAMIN WITH MINERALS) TABS   Oral   Take 1 tablet by mouth daily.         [name] omega-3 acid ethyl esters (LOVAZA) 1 G capsule   Oral   Take 2 g by mouth 2 (two) times daily.         [name] omeprazole (PRILOSEC) 20 MG capsule   Oral   Take 20 mg by mouth 2 (two) times daily.         [name] spironolactone (ALDACTONE) 25 MG tablet   Oral   Take 25 mg by mouth daily.          BP 107/67  Pulse 94  Temp(Src) 98.2 F (36.8 C) (Oral)  Resp 14  SpO2 99% Physical Exam  Nursing note and vitals reviewed. Constitutional: He is oriented to person, place, and time. He appears well-developed and well-nourished. He appears distressed.  HENT:  Head: Normocephalic and atraumatic.  Right Ear: External ear normal.  Left Ear: External ear normal.  Mouth/Throat: No oropharyngeal exudate.  Patient with moderate tongue and lip swelling  Eyes: Conjunctivae and EOM are normal. Pupils are equal, round, and reactive to light. Right eye exhibits no discharge. Left eye exhibits no discharge.  Neck: Normal range of motion. Neck supple. No JVD present. No tracheal deviation present. No thyromegaly present.  Cardiovascular: Normal rate, regular rhythm, normal heart sounds and intact distal pulses.  Exam reveals no gallop and no friction rub.   No murmur heard. Pulmonary/Chest: Effort normal and breath sounds normal. No respiratory distress. He has no wheezes. He exhibits no tenderness.  Abdominal: Soft. Bowel sounds are normal. He exhibits no distension. There is no tenderness. There is no rebound and no guarding.  Musculoskeletal: Normal range of motion. He exhibits no edema and no tenderness.  Lymphadenopathy:    He has no cervical adenopathy.  Neurological: He is alert and oriented to person, place, and time. No cranial nerve deficit.  Skin: Skin is warm. No rash noted. He is diaphoretic. No pallor.  Psychiatric: He has a normal mood and affect.  His behavior is normal.    ED Course   Procedures (including critical care time)  Labs Reviewed  CBC WITH DIFFERENTIAL  COMPREHENSIVE METABOLIC PANEL   Dg Chest 2 View  08/23/2012   *RADIOLOGY REPORT*  Clinical Data: 57 year old male with shortness of breath.  Allergic reaction.  CHEST - 2 VIEW  Comparison: 07/28/2012 and earlier.  Findings: Lower lung volumes.  Stable cardiac size and mediastinal contours.  No pneumothorax or pleural effusion.  No consolidation. Increased pulmonary interstitial opacity  appears primarily related to crowding/atelectasis.  No confluent new opacity identified.  IMPRESSION: Lower lung volumes.  Increased bilateral pulmonary opacity appears primarily related to crowding/atelectasis.   Original Report Authenticated By: Erskine Speed, M.D.   1. Anaphylactic reaction, initial encounter     MDM  57 yr old male pt w/ hx of allergy to PCN here after eating a hamburger and then experiencing allergic symptoms. Patient here with lip and tongue swelling along with the sensation of tightness in the throat. Patient with red hue to skin and diaphoretic. Blood pressure is normal but patient does have a hx of HTN. Gave epinephrine IM along with decadron, benadryl, pepcid.   Patient beginning to feel better but still has some swelling to his lips. Patient without airway issue at this time, but I feel he should be admitted for observation.  Case discussed with Dr. Bernerd Limbo, MD 08/23/12 2106

## 2012-08-23 NOTE — ED Notes (Signed)
PT. REPORTS LOWER LIP SWELLING / THROAT TIGHTNESS AFTER EATING HAMBURGER THIS EVENING , EMESIS AT ARRIVAL WITH DIAPHORESIS AND HEADACHE.

## 2012-08-23 NOTE — ED Provider Notes (Signed)
I saw and evaluated the patient, reviewed the resident's note and I agree with the findings and plan.  Pt had eaten a hamburger and other items this evening, had dizziness, throat and lip swelling, tongue swelling, emesis prior to arrival.  Pt appears to be having an acute anaphylactic reaction likely to something he ingested.  Will give epi IM for acute allergic reaction, followed with steroids, benadryl and close monitoring.    On exam, angioedema present, pt is uncomfortable, trembling.  Lip and tongue swelling noted.  Will need admission for monitoring following severe allergic reaction and epi use.   7:40 PM Pt is less uncomfortable, continues to have angioedema.  Oropharynx appears clear, no stridor, no wheezing.    Pt continues to have angioedema, would admit for observation.  Initial call to [facility] given what is listed as PCP, however, they report he has not been seen in clinic in >2 years and he has thus been dismissed and is unassigned.          Gavin Pound. Oletta Lamas, MD 08/23/12 2153

## 2012-08-23 NOTE — ED Notes (Signed)
Patient transported to X-ray 

## 2012-08-24 ENCOUNTER — Encounter (HOSPITAL_COMMUNITY): Payer: Self-pay | Admitting: Family Medicine

## 2012-08-24 DIAGNOSIS — T782XXA Anaphylactic shock, unspecified, initial encounter: Secondary | ICD-10-CM

## 2012-08-24 DIAGNOSIS — T783XXA Angioneurotic edema, initial encounter: Principal | ICD-10-CM

## 2012-08-24 DIAGNOSIS — F17200 Nicotine dependence, unspecified, uncomplicated: Secondary | ICD-10-CM

## 2012-08-24 DIAGNOSIS — G4733 Obstructive sleep apnea (adult) (pediatric): Secondary | ICD-10-CM

## 2012-08-24 DIAGNOSIS — I10 Essential (primary) hypertension: Secondary | ICD-10-CM

## 2012-08-24 DIAGNOSIS — F1010 Alcohol abuse, uncomplicated: Secondary | ICD-10-CM

## 2012-08-24 LAB — BASIC METABOLIC PANEL
Chloride: 101 mEq/L (ref 96–112)
Creatinine, Ser: 0.91 mg/dL (ref 0.50–1.35)
GFR calc Af Amer: >90 mL/min (ref >90)
GFR calc non Af Amer: >90 mL/min (ref >90)

## 2012-08-24 LAB — CBC
HCT: 39.9 % (ref 39.0–52.0)
MCHC: 36.1 g/dL — ABNORMAL HIGH (ref 30.0–36.0)
Platelets: 295 10*3/uL (ref 150–400)
RDW: 13.7 % (ref 11.5–15.5)
WBC: 13.9 10*3/uL — ABNORMAL HIGH (ref 4.0–10.5)

## 2012-08-24 MED ORDER — METHYLPREDNISOLONE 4 MG PO KIT: PACK | ORAL | Status: DC

## 2012-08-24 MED ORDER — ADULT MULTIVITAMIN W/MINERALS CH
1.0000 | ORAL_TABLET | Freq: Every day | ORAL | Status: DC
  Administered 2012-08-24: 1 via ORAL
  Filled 2012-08-24: qty 1

## 2012-08-24 MED ORDER — PNEUMOCOCCAL VAC POLYVALENT 25 MCG/0.5ML IJ INJ: 0.5000 mL | INJECTION | INTRAMUSCULAR | Status: DC

## 2012-08-24 MED ORDER — LORAZEPAM 2 MG/ML IJ SOLN: 0.0000 mg | Freq: Four times a day (QID) | INTRAMUSCULAR | Status: DC

## 2012-08-24 MED ORDER — VITAMIN B-1 100 MG PO TABS
100.0000 mg | ORAL_TABLET | Freq: Every day | ORAL | Status: DC
  Administered 2012-08-24: 100 mg via ORAL
  Filled 2012-08-24: qty 1

## 2012-08-24 MED ORDER — NICOTINE 14 MG/24HR TD PT24
14.0000 mg | MEDICATED_PATCH | Freq: Every day | TRANSDERMAL | Status: DC
  Administered 2012-08-24: 14 mg via TRANSDERMAL
  Filled 2012-08-24: qty 1

## 2012-08-24 MED ORDER — FUROSEMIDE 20 MG PO TABS
20.0000 mg | ORAL_TABLET | Freq: Every evening | ORAL | Status: DC
  Filled 2012-08-24: qty 1

## 2012-08-24 MED ORDER — EPINEPHRINE 0.3 MG/0.3ML IJ SOAJ: 0.3000 mg | Freq: Once | INTRAMUSCULAR | Status: DC | PRN

## 2012-08-24 MED ORDER — FOLIC ACID 1 MG PO TABS
1.0000 mg | ORAL_TABLET | Freq: Every day | ORAL | Status: DC
  Administered 2012-08-24: 1 mg via ORAL
  Filled 2012-08-24: qty 1

## 2012-08-24 MED ORDER — LORAZEPAM 2 MG/ML IJ SOLN: 1.0000 mg | Freq: Four times a day (QID) | INTRAMUSCULAR | Status: DC | PRN

## 2012-08-24 MED ORDER — SODIUM CHLORIDE 0.9 % IV SOLN
INTRAVENOUS | Status: AC
  Administered 2012-08-24: 02:00:00 via INTRAVENOUS

## 2012-08-24 MED ORDER — LORAZEPAM 1 MG PO TABS: 1.0000 mg | ORAL_TABLET | Freq: Four times a day (QID) | ORAL | Status: DC | PRN

## 2012-08-24 MED ORDER — FUROSEMIDE 40 MG PO TABS
40.0000 mg | ORAL_TABLET | Freq: Every day | ORAL | Status: DC
  Administered 2012-08-24: 40 mg via ORAL
  Filled 2012-08-24: qty 1

## 2012-08-24 MED ORDER — THIAMINE HCL 100 MG/ML IJ SOLN
100.0000 mg | Freq: Every day | INTRAMUSCULAR | Status: DC
  Filled 2012-08-24: qty 1

## 2012-08-24 MED ORDER — DIPHENHYDRAMINE HCL 25 MG PO TABS: 25.0000 mg | ORAL_TABLET | Freq: Three times a day (TID) | ORAL | Status: DC | PRN

## 2012-08-24 MED ORDER — LORAZEPAM 2 MG/ML IJ SOLN: 0.0000 mg | Freq: Two times a day (BID) | INTRAMUSCULAR | Status: DC

## 2012-08-24 MED ORDER — RANITIDINE HCL 150 MG PO TABS: 150.0000 mg | ORAL_TABLET | Freq: Two times a day (BID) | ORAL | Status: DC

## 2012-08-24 MED ORDER — PNEUMOCOCCAL VAC POLYVALENT 25 MCG/0.5ML IJ INJ
0.5000 mL | INJECTION | Freq: Once | INTRAMUSCULAR | Status: AC
  Administered 2012-08-24: 0.5 mL via INTRAMUSCULAR
  Filled 2012-08-24: qty 0.5

## 2012-08-24 NOTE — Progress Notes (Signed)
1600 discharge instructions to pt and daughter. Verbalized understanding  1620 Wheeled to lobby

## 2012-08-24 NOTE — H&P (Signed)
[facility] History and Physical  Ethan Wilcox WUJ:[id number] DOB: January 27, 1955 DOA: 08/23/2012  Referring physician: EDP PCP: Dow Adolph, MD  Specialists:   Chief Complaint: Swelling of his Tongue and Lips  HPI: Ethan Wilcox is a 57 y.o. male who presents to the ED with complaints  of swelling of his tongue and lips 30 minutes after he ate some fast food from [employer].  He reports eating a chicken sandwich.    He denies having any previous similar episodes.  He was administered IV epinephrine, and then given IV solumedrol in the ED and he began to improve, he was referred for observation.     Review of Systems: The patient denies anorexia, fever, chills, headaches, weight loss, vision loss, diplopia, dizziness, decreased hearing, rhinitis, hoarseness, chest pain, syncope, dyspnea on exertion, peripheral edema, balance deficits, cough, hemoptysis, abdominal pain, nausea, vomiting, diarrhea, constipation, hematemesis, melena, hematochezia, severe indigestion/heartburn, dysuria, hematuria, incontinence, muscle weakness, suspicious skin lesions, transient blindness, difficulty walking, depression, unusual weight change, abnormal bleeding, enlarged lymph nodes, angioedema, and breast masses.    Past Medical History  Diagnosis Date  . Nonischemic cardiomyopathy     admitted 5/11 w acute systolic CHF. LHC showed no angiographic CAD. Echo (5/11) showed EF 15-20% w diffuse sever hypokinesis, moderate MR> RHC showed PCWP 10, CI 1.47 (Fick.) cause of CMP thought to be heavy ETOH and cocaine. No ICD yet due to ongoing ETOH intake  . ETOH abuse   . Cocaine abuse   . Active smoker /    1/2 ppd  . Obesity   . Hypertension   . CHF (congestive heart failure)   . Gout   . GERD (gastroesophageal reflux disease)   . Sleep apnea     Past Surgical History  Procedure Laterality Date  . No past surgeries      Prior to Admission medications   Medication Sig Start Date End Date Taking?  Authorizing Provider  acetaminophen-codeine (TYLENOL #3) 300-30 MG per tablet Take 1-2 tablets by mouth every 6 (six) hours as needed for pain. 07/29/12  Yes Arman Filter, NP  albuterol (PROVENTIL HFA) 108 (90 BASE) MCG/ACT inhaler Inhale 2 puffs into the lungs every 4 (four) hours as needed. For shortness of breath   Yes Historical Provider, MD  aspirin (ASPIR-LOW) 81 MG EC tablet Take 81 mg by mouth daily.    Yes Historical Provider, MD  carvedilol (COREG) 25 MG tablet Take 1 tablet (25 mg total) by mouth 2 (two) times daily. 05/01/12  Yes Laurey Morale, MD  diphenhydramine-acetaminophen (TYLENOL PM) 25-500 MG TABS Take 1 tablet by mouth every 6 (six) hours.    Yes Historical Provider, MD  furosemide (LASIX) 40 MG tablet Take 20-40 mg by mouth 2 (two) times daily. 40mg  in the morning and 20mg  in the evening.   Yes Historical Provider, MD  isosorbide-hydrALAZINE (BIDIL) 20-37.5 MG per tablet Take 1 tablet by mouth 3 (three) times daily.   Yes Historical Provider, MD  lisinopril (PRINIVIL,ZESTRIL) 20 MG tablet Take 1 tablet (20 mg total) by mouth daily. 07/30/12  Yes Laurey Morale, MD  Multiple Vitamin (MULTIVITAMIN WITH MINERALS) TABS Take 1 tablet by mouth daily.   Yes Historical Provider, MD  omega-3 acid ethyl esters (LOVAZA) 1 G capsule Take 2 g by mouth 2 (two) times daily.   Yes Historical Provider, MD  omeprazole (PRILOSEC) 20 MG capsule Take 20 mg by mouth 2 (two) times daily.   Yes Historical Provider, MD  spironolactone (  ALDACTONE) 25 MG tablet Take 25 mg by mouth daily.   Yes Historical Provider, MD    Allergies  Allergen Reactions  . Penicillins Other (See Comments)    unknow    Social History:  reports that he has been smoking Cigarettes.  He has a 19 pack-year smoking history. He does not have any smokeless tobacco history on file. He reports that he drinks about 7.2 ounces of alcohol per week. He reports that he uses illicit drugs (Cocaine).     Family History  Problem  Relation Age of Onset  . Heart disease Mother   . Cancer Mother   . Heart disease Father        Physical Exam:  GEN:  Pleasant  Obese  57 y.o. African American male  examined  and in no acute distress; cooperative with exam Filed Vitals:   08/23/12 2236 08/23/12 2346 08/24/12 0039 08/24/12 0130  BP: 136/97 108/87 128/87 108/75  Pulse: 93 95 92 81  Temp:    97.6 F (36.4 C)  TempSrc:    Oral  Resp: 14 14 14 14   Height:    5\' 7"  (1.702 m)  Weight:    111.902 kg (246 lb 11.2 oz)  SpO2: 100% 99% 99% 100%   Blood pressure 108/75, pulse 81, temperature 97.6 F (36.4 C), temperature source Oral, resp. rate 14, height 5\' 7"  (1.702 m), weight 111.902 kg (246 lb 11.2 oz), SpO2 100.00%. PSYCH: He is alert and oriented x4; does not appear anxious does not appear depressed; affect is normal HEENT: Normocephalic and Atraumatic  Except for Edema of both lips, Mucous membranes pink; PERRLA; EOM intact; Fundi:  Benign;  No scleral icterus, Nares: Patent, Oropharynx: Fair Dentition   Edmatous Tongue, No Tonsillar Swelling,  Airway Open, Neck:  FROM, no cervical lymphadenopathy nor thyromegaly or carotid bruit; no JVD;  Breasts:: Not examined CHEST WALL: No tenderness CHEST: Normal respiration, clear to auscultation bilaterally HEART: Regular rate and rhythm; no murmurs rubs or gallops BACK: No kyphosis or scoliosis; no CVA tenderness ABDOMEN: Positive Bowel Sounds, Obese, soft non-tender; no masses, no organomegaly, no pannus; no intertriginous candida. Rectal Exam: Not done EXTREMITIES: No cyanosis, clubbing or edema; no ulcerations. Genitalia: not examined PULSES: 2+ and symmetric SKIN: Normal hydration no rash or ulceration CNS: Cranial nerves 2-12 grossly intact no focal neurologic deficit    Labs on Admission:  Basic Metabolic Panel:  Recent Labs Lab 08/23/12 2021  NA 136  K 4.1  CL 102  CO2 19  GLUCOSE 148*  BUN 22  CREATININE 1.39*  CALCIUM 9.2   Liver Function  Tests:  Recent Labs Lab 08/23/12 2021  AST 22  ALT 32  ALKPHOS 77  BILITOT 0.4  PROT 6.5  ALBUMIN 3.6   No results found for this basename: LIPASE, AMYLASE,  in the last 168 hours No results found for this basename: AMMONIA,  in the last 168 hours CBC:  Recent Labs Lab 08/23/12 2021  WBC 8.2  NEUTROABS 4.7  HGB 15.5  HCT 45.9  MCV 90.7  PLT 271   Cardiac Enzymes: No results found for this basename: CKTOTAL, CKMB, CKMBINDEX, TROPONINI,  in the last 168 hours  BNP (last 3 results)  Recent Labs  11/15/11 1613 05/01/12 1324 08/14/12 1129  PROBNP 32.0 28.0 28.0   CBG: No results found for this basename: GLUCAP,  in the last 168 hours  Radiological Exams on Admission: Dg Chest 2 View  08/23/2012   *RADIOLOGY REPORT*  Clinical  Data: 57 year old male with shortness of breath.  Allergic reaction.  CHEST - 2 VIEW  Comparison: 07/28/2012 and earlier.  Findings: Lower lung volumes.  Stable cardiac size and mediastinal contours.  No pneumothorax or pleural effusion.  No consolidation. Increased pulmonary interstitial opacity appears primarily related to crowding/atelectasis.  No confluent new opacity identified.  IMPRESSION: Lower lung volumes.  Increased bilateral pulmonary opacity appears primarily related to crowding/atelectasis.   Original Report Authenticated By: Erskine Speed, M.D.       Assessment/Plan Principal Problem:   Angioedema Active Problems:   Anaphylaxis due to food   ALCOHOL ABUSE   TOBACCO ABUSE   HYPERTENSION, BENIGN ESSENTIAL   Chronic systolic heart failure     1.   Angioedema/Anaphylaxis from Food-   IV solumedrol, IV pepcid, and Monitor for worsening or compromise of airway.  ACe-Inhibitor also d/ced.     2.   ETOH Abuse hx-  CIWA Protocol ordered.    3.  HTN-  continue Spironolactone, discontinue Lisinopril due to ANgioedema  4.  Tobaccos Abuse-  Nicotine patch daily , and Counseled re: Smoking Cessation.     5.  DVT prophylaxis with  Lovenox.       Code Status:    FULL CODE Family Communication:    No Family At bedside Disposition Plan:     Return to Home on Discharge  Time spent:  60 Minutes  Ron Parker [facility] Pager [phone]  If 7PM-7AM, please contact night-coverage [email or web address] Password [email or web address] 08/24/2012, 3:46 AM

## 2012-08-24 NOTE — Progress Notes (Signed)
Nutrition Brief Note  Patient identified on the Malnutrition Screening Tool (MST) Report for recent weight lost without trying; desirable given obesity.  Wt Readings from Last 15 Encounters:  08/24/12 246 lb 11.2 oz (111.902 kg)  07/30/12 248 lb (112.492 kg)  05/01/12 250 lb (113.399 kg)  11/15/11 242 lb (109.77 kg)  08/17/11 253 lb (114.76 kg)  07/14/11 257 lb (116.574 kg)  07/11/11 255 lb 6.4 oz (115.849 kg)  06/21/11 250 lb (113.399 kg)  05/11/11 246 lb 12.8 oz (111.948 kg)  02/28/11 244 lb (110.678 kg)  11/24/10 252 lb (114.306 kg)  03/23/10 238 lb (107.956 kg)  03/09/10 242 lb (109.77 kg)  12/21/09 230 lb 8 oz (104.554 kg)  12/09/09 238 lb (107.956 kg)    Body mass index is 38.63 kg/(m^2). Patient meets criteria for Obesity Class II based on current BMI.   Current diet order is NPO. Labs and medications reviewed.   No nutrition interventions warranted at this time. If nutrition issues arise, please consult RD.   Maureen Chatters, RD, LDN Pager #: [phone] After-Hours Pager #: [phone]

## 2012-08-24 NOTE — Progress Notes (Signed)
0800 pt no apparent distress . No sob . No swelling of mouth and lips . With good swallowing reflexes . Called for diet order from Dr. Thedore Mins . Seen and evaluated by MD . With orders

## 2012-08-24 NOTE — Progress Notes (Signed)
08/24/2012 Pt.arrived to unit around 0120 by stretcher on oxygen at 2 L [place] and without IV pump. He is A/Ox4 and ambulated from stretcher to bed with no assistance. His gait is steady. He had no signs of distress and no c/o shortness of breath. Patient is now resting in bed.

## 2012-08-24 NOTE — ED Notes (Signed)
REPORT GIVEN TO 4 EAST UNIT NURSE , TRANSPORTED IN STABLE CONDITION , RESPIRATIONS UNLABORED , DENIES PAIN / IV SITE INTACT.

## 2012-08-24 NOTE — Discharge Summary (Signed)
[facility]                                                                                   Ethan Wilcox, is a 57 y.o. male  DOB December 14, 1955  MRN [id number].  Admission date:  08/23/2012  Discharge Date:  08/24/2012  Primary MD  Dow Adolph, MD  Admitting Physician  Ron Parker, MD  Admission Diagnosis  Anaphylactic reaction, initial encounter [995.0]  Discharge Diagnosis     Principal Problem:   Angioedema Active Problems:   ALCOHOL ABUSE   TOBACCO ABUSE   HYPERTENSION, BENIGN ESSENTIAL   Chronic systolic heart failure   Anaphylaxis due to food    Past Medical History  Diagnosis Date  . Nonischemic cardiomyopathy     admitted 5/11 w acute systolic CHF. LHC showed no angiographic CAD. Echo (5/11) showed EF 15-20% w diffuse sever hypokinesis, moderate MR> RHC showed PCWP 10, CI 1.47 (Fick.) cause of CMP thought to be heavy ETOH and cocaine. No ICD yet due to ongoing ETOH intake  . ETOH abuse   . Cocaine abuse   . Active smoker /    1/2 ppd  . Obesity   . Hypertension   . CHF (congestive heart failure)   . Gout   . GERD (gastroesophageal reflux disease)   . Sleep apnea     Past Surgical History  Procedure Laterality Date  . No past surgeries       Recommendations for primary care physician for things to follow:    Follow BMP closely consider outpatient allergist follow up x1   Discharge Diagnoses:   Principal Problem:   Angioedema Active Problems:   ALCOHOL ABUSE   TOBACCO ABUSE   HYPERTENSION, BENIGN ESSENTIAL   Chronic systolic heart failure   Anaphylaxis due to food    Discharge Condition: stable   Diet recommendation: See Discharge Instructions below   Consults      History of present illness and  Hospital Course:     Kindly see H&P for history of present illness and admission details, please review complete Labs, Consult reports and Test reports for all details in brief Ethan Wilcox, is a 57 y.o. male, patient  with history of Hypertension, tobacco abuse and alcohol abuse counseled to quit both, chronic systolic heart failure was admitted to the hospital after severe tongue and lip swelling secondary to angioedema from suspected chicken sandwich which she ate at [facility], however he was also on ACE inhibitor, ACE inhibitor has been stopped, he responded very well to IV steroids, Benadryl, at this time he is feeling close to baseline, lip swelling is almost completely resolved, tongue swelling is completely resolved, he has no throat itching or shortness of breath and is eager to go home.  Will be discharged home I will stop his ACE inhibitor, I have asked him to avoid the foods which could have caused this reaction, also his potassium was noted to be around top normal in and stopped his Aldactone, we'll request his PCP to monitor his BMP and his symptoms closely, consider one time outpatient allergist followup. He should also followup with his cardiologist in one to 2 weeks  for other options for his heart failure. Patient is compensated from CHF standpoint this admission.      Today   Subjective:   Ethan Wilcox today has no headache,no chest abdominal pain,no new weakness tingling or numbness, feels much better wants to go home today.    Objective:   Blood pressure 119/54, pulse 81, temperature 97.8 F (36.6 C), temperature source Oral, resp. rate 16, height 5\' 7"  (1.702 m), weight 111.902 kg (246 lb 11.2 oz), SpO2 97.00%.   Intake/Output Summary (Last 24 hours) at 08/24/12 1146 Last data filed at 08/24/12 0956  Gross per 24 hour  Intake    243 ml  Output    350 ml  Net   -107 ml    Exam Awake Alert, Oriented *3, No new F.N deficits, Normal affect [place].AT,PERRAL,No tongue swelling, minimal to none lip swelling Supple Neck,No JVD, No cervical lymphadenopathy appriciated.  Symmetrical Chest wall movement, Good air movement bilaterally, CTAB RRR,No Gallops,Rubs or new Murmurs, No Parasternal  Heave +ve B.Sounds, Abd Soft, Non tender, No organomegaly appriciated, No rebound -guarding or rigidity. No Cyanosis, Clubbing or edema, No new Rash or bruise  Data Review   Major procedures and Radiology Reports - PLEASE review detailed and final reports for all details, in brief -       Dg Chest 2 View  08/23/2012   *RADIOLOGY REPORT*  Clinical Data: 58 year old male with shortness of breath.  Allergic reaction.  CHEST - 2 VIEW  Comparison: 07/28/2012 and earlier.  Findings: Lower lung volumes.  Stable cardiac size and mediastinal contours.  No pneumothorax or pleural effusion.  No consolidation. Increased pulmonary interstitial opacity appears primarily related to crowding/atelectasis.  No confluent new opacity identified.  IMPRESSION: Lower lung volumes.  Increased bilateral pulmonary opacity appears primarily related to crowding/atelectasis.   Original Report Authenticated By: Erskine Speed, M.D.      Micro Results      No results found for this or any previous visit (from the past 240 hour(s)).   CBC w Diff: Lab Results  Component Value Date   WBC 13.9* 08/24/2012   HGB 14.4 08/24/2012   HCT 39.9 08/24/2012   PLT 295 08/24/2012   LYMPHOPCT 37 08/23/2012   MONOPCT 4 08/23/2012   EOSPCT 2 08/23/2012   BASOPCT 0 08/23/2012    CMP: Lab Results  Component Value Date   NA 135 08/24/2012   K 5.1 08/24/2012   CL 101 08/24/2012   CO2 23 08/24/2012   BUN 17 08/24/2012   CREATININE 0.91 08/24/2012   PROT 6.5 08/23/2012   ALBUMIN 3.6 08/23/2012   BILITOT 0.4 08/23/2012   ALKPHOS 77 08/23/2012   AST 22 08/23/2012   ALT 32 08/23/2012  .   Discharge Instructions      If you develop tongue or lip swelling, throat itching or shortness of breath come to the nearest ER immediately or call 911  Follow with Primary MD Dow Adolph, MD in 3 days   Get CBC, CMP, checked 3 days by Primary MD and again as instructed by your Primary MD.   Get Medicines reviewed and adjusted.  Please request  your Prim.MD to go over all Hospital Tests and Procedure/Radiological results at the follow up, please get all Hospital records sent to your Prim MD by signing hospital release before you go home.  Activity: As tolerated with Full fall precautions use walker/cane & assistance as needed   Diet:  Heart healthy, avoid any foods which could have caused  her symptoms  For Heart failure patients - Check your Weight same time everyday, if you gain over 2 pounds, or you develop in leg swelling, experience more shortness of breath or chest pain, call your Primary MD immediately. Follow Cardiac Low Salt Diet and 1.8 lit/day fluid restriction.  Disposition Home    If you experience worsening of your admission symptoms, develop shortness of breath, life threatening emergency, suicidal or homicidal thoughts you must seek medical attention immediately by calling 911 or calling your MD immediately  if symptoms less severe.  You Must read complete instructions/literature along with all the possible adverse reactions/side effects for all the Medicines you take and that have been prescribed to you. Take any new Medicines after you have completely understood and accpet all the possible adverse reactions/side effects.   Do not drive and provide baby sitting services if your were admitted for syncope or siezures until you have seen by Primary MD or a Neurologist and advised to do so again.  Do not drive when taking Pain medications.    Do not take more than prescribed Pain, Sleep and Anxiety Medications  Special Instructions: If you have smoked or chewed Tobacco  in the last 2 yrs please stop smoking, stop any regular Alcohol  and or any Recreational drug use.  Wear Seat belts while driving.   Please note  You were cared for by a hospitalist during your hospital stay. If you have any questions about your discharge medications or the care you received while you were in the hospital after you are discharged,  you can call the unit and asked to speak with the hospitalist on call if the hospitalist that took care of you is not available. Once you are discharged, your primary care physician will handle any further medical issues. Please note that NO REFILLS for any discharge medications will be authorized once you are discharged, as it is imperative that you return to your primary care physician (or establish a relationship with a primary care physician if you do not have one) for your aftercare needs so that they can reassess your need for medications and monitor your lab values.    Follow-up Information   Follow up with Dow Adolph, MD. Schedule an appointment as soon as possible for a visit in 3 days.   Specialty:  Internal Medicine   Contact information:   [address] [place] [address] [phone]         Discharge Medications     Medication List    STOP taking these medications       lisinopril 20 MG tablet  Commonly known as:  PRINIVIL,ZESTRIL     spironolactone 25 MG tablet  Commonly known as:  ALDACTONE      TAKE these medications       acetaminophen-codeine 300-30 MG per tablet  Commonly known as:  TYLENOL #3  Take 1-2 tablets by mouth every 6 (six) hours as needed for pain.     ASPIR-LOW 81 MG EC tablet  Generic drug:  aspirin  Take 81 mg by mouth daily.     carvedilol 25 MG tablet  Commonly known as:  COREG  Take 1 tablet (25 mg total) by mouth 2 (two) times daily.     diphenhydrAMINE 25 MG tablet  Commonly known as:  BENADRYL  Take 1 tablet (25 mg total) by mouth every 8 (eight) hours as needed for itching or allergies.     diphenhydramine-acetaminophen 25-500 MG Tabs  Commonly  known as:  TYLENOL PM  Take 1 tablet by mouth every 6 (six) hours.     EPINEPHrine 0.3 mg/0.3 mL Soaj injection  Commonly known as:  EPIPEN  Inject 0.3 mL (0.3 mg total) into the muscle once as needed (anaphylaxis).     furosemide 40 MG tablet  Commonly known as:   LASIX  Take 20-40 mg by mouth 2 (two) times daily. 40mg  in the morning and 20mg  in the evening.     isosorbide-hydrALAZINE 20-37.5 MG per tablet  Commonly known as:  BIDIL  Take 1 tablet by mouth 3 (three) times daily.     methylPREDNISolone 4 MG tablet  Commonly known as:  MEDROL DOSEPAK  follow package directions     multivitamin with minerals Tabs tablet  Take 1 tablet by mouth daily.     omega-3 acid ethyl esters 1 G capsule  Commonly known as:  LOVAZA  Take 2 g by mouth 2 (two) times daily.     omeprazole 20 MG capsule  Commonly known as:  PRILOSEC  Take 20 mg by mouth 2 (two) times daily.     PROVENTIL HFA 108 (90 BASE) MCG/ACT inhaler  Generic drug:  albuterol  Inhale 2 puffs into the lungs every 4 (four) hours as needed. For shortness of breath     ranitidine 150 MG tablet  Commonly known as:  ZANTAC  Take 1 tablet (150 mg total) by mouth 2 (two) times daily.           Total Time in preparing paper work, data evaluation and todays exam - 35 minutes  Leroy Sea M.D on 08/24/2012 at 11:46 AM  [facility] Office  [phone]

## 2012-08-26 NOTE — Telephone Encounter (Signed)
Ethan Wilcox notified that Dr Shirlee Latch does not manage sleep apnea or order CPAP equipment. She is aware that Epic indicates Dr Marcelyn Bruins has been managing sleep apnea for this patient.

## 2012-08-28 ENCOUNTER — Encounter: Payer: Self-pay | Admitting: Licensed Clinical Social Worker

## 2012-08-28 ENCOUNTER — Encounter: Payer: Self-pay | Admitting: Internal Medicine

## 2012-08-28 ENCOUNTER — Ambulatory Visit (INDEPENDENT_AMBULATORY_CARE_PROVIDER_SITE_OTHER): Payer: Medicaid Other | Admitting: Internal Medicine

## 2012-08-28 VITALS — BP 111/70 | HR 82 | Temp 97.8°F | Ht 67.0 in | Wt 250.2 lb

## 2012-08-28 DIAGNOSIS — Z5189 Encounter for other specified aftercare: Secondary | ICD-10-CM

## 2012-08-28 DIAGNOSIS — I5022 Chronic systolic (congestive) heart failure: Secondary | ICD-10-CM

## 2012-08-28 DIAGNOSIS — F1010 Alcohol abuse, uncomplicated: Secondary | ICD-10-CM

## 2012-08-28 DIAGNOSIS — I428 Other cardiomyopathies: Secondary | ICD-10-CM

## 2012-08-28 DIAGNOSIS — I509 Heart failure, unspecified: Secondary | ICD-10-CM

## 2012-08-28 DIAGNOSIS — Z9119 Patient's noncompliance with other medical treatment and regimen: Secondary | ICD-10-CM

## 2012-08-28 DIAGNOSIS — Z550 Illiteracy and low-level literacy: Secondary | ICD-10-CM | POA: Insufficient documentation

## 2012-08-28 DIAGNOSIS — T7800XD Anaphylactic reaction due to unspecified food, subsequent encounter: Secondary | ICD-10-CM

## 2012-08-28 DIAGNOSIS — G4733 Obstructive sleep apnea (adult) (pediatric): Secondary | ICD-10-CM

## 2012-08-28 DIAGNOSIS — N179 Acute kidney failure, unspecified: Secondary | ICD-10-CM

## 2012-08-28 DIAGNOSIS — I5021 Acute systolic (congestive) heart failure: Secondary | ICD-10-CM

## 2012-08-28 DIAGNOSIS — I10 Essential (primary) hypertension: Secondary | ICD-10-CM

## 2012-08-28 DIAGNOSIS — F17200 Nicotine dependence, unspecified, uncomplicated: Secondary | ICD-10-CM

## 2012-08-28 DIAGNOSIS — Z9114 Patient's other noncompliance with medication regimen: Secondary | ICD-10-CM

## 2012-08-28 DIAGNOSIS — T783XXD Angioneurotic edema, subsequent encounter: Secondary | ICD-10-CM

## 2012-08-28 LAB — CBC
HCT: 42.2 % (ref 39.0–52.0)
MCH: 31 pg (ref 26.0–34.0)
MCV: 92.1 fL (ref 78.0–100.0)
Platelets: 287 10*3/uL (ref 150–400)
RDW: 14.3 % (ref 11.5–15.5)
WBC: 11.9 10*3/uL — ABNORMAL HIGH (ref 4.0–10.5)

## 2012-08-28 LAB — BASIC METABOLIC PANEL WITH GFR
BUN: 15 mg/dL (ref 6–23)
CO2: 27 mEq/L (ref 19–32)
Calcium: 9.2 mg/dL (ref 8.4–10.5)
Chloride: 100 mEq/L (ref 96–112)
Creat: 1 mg/dL (ref 0.50–1.35)
Glucose, Bld: 104 mg/dL — ABNORMAL HIGH (ref 70–99)

## 2012-08-28 NOTE — Assessment & Plan Note (Signed)
Referral to SW for [facility] RN to assist with pills once a month.

## 2012-08-28 NOTE — Assessment & Plan Note (Signed)
Noted a bump in his creatinine during his admission on 08/24/2012. Potassium was 5.1. Spironolactone, and lisinopril discontinued. Will check a BMP.

## 2012-08-28 NOTE — Assessment & Plan Note (Signed)
BP Readings from Last 3 Encounters:  08/28/12 111/70  08/24/12 103/51  07/30/12 126/80    Lab Results  Component Value Date   NA 135 08/24/2012   K 5.1 08/24/2012   CREATININE 0.91 08/24/2012    Assessment: Blood pressure control: controlled Progress toward BP goal:  at goal Comments:   Plan: Medications:  Continue with Coreg, Lasix, spironolactone, Bidil. Discontinue lisinopril due to angioedema Educational resources provided: brochure Self management tools provided: home blood pressure logbook Other plans: possible ACEi induced angioedema. Will repeat BMP today for K and Cr

## 2012-08-28 NOTE — Assessment & Plan Note (Signed)
Counseled him to use his CPAP more consistently.

## 2012-08-28 NOTE — Progress Notes (Addendum)
Patient ID: YOUSSEF Wilcox, male   DOB: [birth year], 56 y.o.   MRN: [id number]   Subjective:   HPI: Ethan Wilcox is a 57 y.o. a gentleman, with past medical history of congestive heart failure, secondary to nonischemic cardiomyopathy, hypertension, obesity, obstructive sleep apnea, excessive alcohol use and cigarette smoking, presents to the clinic to establish care.  He was discharged from the hospital and 08/25/2012 for anaphylaxis to an unidentified allergen - thought to be lisinopril versus a food product (shrimp vs chicken vs spices). At around 3 PM on the day he was admitted to the hospital , he had eaten shrimp and he did not notice any problems. At around 4 PM on the same day, he ate a piece of chicken, which had been brought from [employer] and 15 minutes later he developed itching in his head, swelling of his lips, hoarseness of voice, SOB, and he vomited once. He was brought to the emergency department. His symptoms resolved after one day of admission. He was discharged with EpiPen and a tapering dose of prednisone. Lisinopril was discontinued. Also his spironolactone was held due to his potassium being 5.1. However, the patient reports that he has continued taking of both these medications likely from misunderstanding of hospital discharge instructions. No recurrence of similar symptoms. He feels usual today.    Please see the A&P for the status of the pt's chronic medical problems.    Past Medical History  Diagnosis Date  . Nonischemic cardiomyopathy     admitted 5/11 w acute systolic CHF. LHC showed no angiographic CAD. Echo (5/11) showed EF 15-20% w diffuse sever hypokinesis, moderate MR> RHC showed PCWP 10, CI 1.47 (Fick.) cause of CMP thought to be heavy ETOH and cocaine. No ICD yet due to ongoing ETOH intake  . ETOH abuse   . Cocaine abuse   . Active smoker /    1/2 ppd  . Obesity   . Hypertension   . CHF (congestive heart failure)   . Gout   . GERD (gastroesophageal  reflux disease)   . Sleep apnea    Current Outpatient Prescriptions  Medication Sig Dispense Refill  . albuterol (PROVENTIL HFA) 108 (90 BASE) MCG/ACT inhaler Inhale 2 puffs into the lungs every 4 (four) hours as needed. For shortness of breath      . aspirin (ASPIR-LOW) 81 MG EC tablet Take 81 mg by mouth daily.       . carvedilol (COREG) 25 MG tablet Take 1 tablet (25 mg total) by mouth 2 (two) times daily.  60 tablet  6  . diphenhydramine-acetaminophen (TYLENOL PM) 25-500 MG TABS Take 1 tablet by mouth every 6 (six) hours.       [name] EPINEPHrine (EPIPEN) 0.3 mg/0.3 mL SOAJ injection Inject 0.3 mL (0.3 mg total) into the muscle once as needed (anaphylaxis).  1 Device  1  . furosemide (LASIX) 40 MG tablet Take 20-40 mg by mouth 2 (two) times daily. 40mg  in the morning and 20mg  in the evening.      . isosorbide-hydrALAZINE (BIDIL) 20-37.5 MG per tablet Take 1 tablet by mouth 3 (three) times daily.      . methylPREDNISolone (MEDROL DOSEPAK) 4 MG tablet follow package directions  21 tablet  0  . Multiple Vitamin (MULTIVITAMIN WITH MINERALS) TABS Take 1 tablet by mouth daily.      [name] omega-3 acid ethyl esters (LOVAZA) 1 G capsule Take 2 g by mouth 2 (two) times daily.      [name] omeprazole (  PRILOSEC) 20 MG capsule Take 20 mg by mouth 2 (two) times daily.      . ranitidine (ZANTAC) 150 MG tablet Take 1 tablet (150 mg total) by mouth 2 (two) times daily.  30 tablet  0  . spironolactone (ALDACTONE) 25 MG tablet Take 25 mg by mouth daily.       No current facility-administered medications for this visit.   Family History  Problem Relation Age of Onset  . Heart disease Mother   . Cancer Mother   . Heart disease Father    History   Social History  . Marital Status: Single    Spouse Name: N/A    Number of Children: N/A  . Years of Education: N/A   Occupational History  . DISABLED    Social History Main Topics  . Smoking status: Current Every Day Smoker -- 0.50 packs/day for 38 years    Types:  Cigarettes  . Smokeless tobacco: None     Comment: was on patches.  Was stopped.  Restarted smoking  . Alcohol Use: 7.2 oz/week    12 Shots of liquor per week     Comment: Vodka 1/2 pint 3 days a week  . Drug Use: Yes    Special: Cocaine     Comment: former  . Sexual Activity: None   Other Topics Concern  . None   Social History Narrative   Works as [employer]aff[employer]. 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 Heavy ETOH in past, now back to drinking 1/2 pint/week.          No show 07/16/09   Review of Systems: Constitutional: Denies fever, chills, diaphoresis, appetite change and fatigue. Reports day time sleepiness.   Respiratory: SOB and DOE at baseline. He is able to walk a while. His symptoms have not changed at all. Denies cough, chest tightness, and wheezing. His wt is unchanged ~250lb Cardiovascular: No chest pain, palpitations and leg swelling.  Gastrointestinal: No abdominal pain, nausea, vomiting, bloody stools Genitourinary: No dysuria, frequency, hematuria, or flank pain.  Musculoskeletal: No myalgias, back pain, joint swelling, arthralgias    Objective:  Physical Exam: Filed Vitals:   08/28/12 0820  BP: 111/70  Pulse: 82  Temp: 97.8 F (36.6 C)  TempSrc: Oral  Height: 5\' 7"  (1.702 m)  Weight: 250 lb 3.2 oz (113.49 kg)  SpO2: 97%   General: Obese, No acute distress.  Lungs: CTA bilaterally. Heart: RRR; no extra sounds or murmurs. JVD not appreciated.  Abdomen: Non-distended, normal BS, soft, nontender; no hepatosplenomegaly  Extremities: No pedal edema. No joint swelling or tenderness. Neurologic: Alert and oriented x3. No obvious neurologic deficits.  Assessment & Plan:  I have discussed my assessment and plan  with Dr. Criselda Peaches as detailed under problem based charting.

## 2012-08-28 NOTE — Assessment & Plan Note (Signed)
Will continue work with him to quit smoking.

## 2012-08-28 NOTE — Assessment & Plan Note (Addendum)
Discussed with him about need to reduce alcohol. I was seeing him for the first time. We'll continue to discuss this issue. Also engaged Child psychotherapist to identify resources in the community to help him with alcohol cessation.

## 2012-08-28 NOTE — Assessment & Plan Note (Addendum)
Patient had continued taking lisinopril for 3 days after leaving hospital. Symptoms resolved.  Plan: We will hold lisinopril for now since we can not exclude ACEi as a culprit. I called her pharmacy and DC'd all his refills and lisinopril.  Patient has EpiPen in pocket.  Referral to outpatient allergist to identify allergen.  Continue with tapering dose of prednisone

## 2012-08-28 NOTE — Patient Instructions (Addendum)
General Instructions: Please stop taking Lisinopril due to a possible cause of your allergy symptoms you had last week I will call you with instruction regarding your Spironolactone We will do some blood checks today  Please use your CPAP more consistently as it will help you sleep better  I will refer you to an allergy specialist today  I am going to assist you set up a nurse who can come to your house to help you with your medications  Please come back in one month to see me.   Treatment Goals:  Goals (1 Years of Data) as of 08/28/12         As of Today 08/24/12 08/24/12 08/24/12 08/24/12     Blood Pressure    . Blood Pressure < 140/90  111/70 103/51 125/64 119/54 108/75      Progress Toward Treatment Goals:  Treatment Goal 08/28/2012  Blood pressure at goal  Stop smoking smoking the same amount    Self Care Goals & Plans:  Self Care Goal 08/28/2012  Manage my medications take my medicines as prescribed; bring my medications to every visit; refill my medications on time  Eat healthy foods drink diet soda or water instead of juice or soda; eat more vegetables; eat foods that are low in salt; eat baked foods instead of fried foods       Care Management & Community Referrals:

## 2012-08-29 ENCOUNTER — Encounter: Payer: Self-pay | Admitting: Internal Medicine

## 2012-08-29 ENCOUNTER — Other Ambulatory Visit: Payer: Self-pay | Admitting: Licensed Clinical Social Worker

## 2012-08-29 DIAGNOSIS — Z299 Encounter for prophylactic measures, unspecified: Secondary | ICD-10-CM | POA: Insufficient documentation

## 2012-08-29 DIAGNOSIS — I5022 Chronic systolic (congestive) heart failure: Secondary | ICD-10-CM

## 2012-08-29 NOTE — Progress Notes (Addendum)
Ethan Wilcox was referred to CSW for substance abuse, low literacy and home health RN services.  CSW met with Ethan Wilcox during scheduled [facility] appt.  Pt states he drinks about 1/2 pint every other day and states this help him sleep at night.  Ethan Wilcox does not see how is drinking is an issue at this time and is not interested in substance abuse resources at this time.  Ethan Wilcox reports that he has filled all medication prescribed except for a Rx for benadryl we he has yet to fill.  Pt reports that he is taking his medications as prescribed but often runs out prior to the end of the month.  Physician voices some concern that pt may not be taking medication accurately. PCP noted Ethan Wilcox has low literacy.  Ethan Wilcox has medicaid and uses [employer] for medical transportation and utilizes [employer] for [employer].  Ethan Wilcox would benefit from [facility] RN for chronic disease education, management and monthly med box refill.  Ethan Wilcox has is not able to relate how his behavioral factors (not taking medication, drinking alcohol and smoking) affect disease processes.  Pt states he has not used his CPAP in some time, but still has it and will start using again.  CSW discussed the availability of [facility] with Medicaid [place].  Ethan Wilcox denies any care management from DSS and signed [place] [employer] enrollment form.  CSW will make referral to [facility] once pt is enrolled in [place] [facility] under [facility].  Pt in agreement for [facility] RN services through [facility].  Pt denies add'l needs for himself, but inquired about significant other's DME questions.  CSW provided assistance and contact information to [facility] store.

## 2012-09-02 NOTE — Progress Notes (Signed)
Case discussed with Dr. Kazibwe soon after the resident saw the patient.  We reviewed the resident's history and exam and pertinent patient test results.  I agree with the assessment, diagnosis, and plan of care documented in the resident's note. 

## 2012-09-03 ENCOUNTER — Ambulatory Visit: Payer: Medicaid Other | Admitting: Internal Medicine

## 2012-09-25 ENCOUNTER — Other Ambulatory Visit (HOSPITAL_COMMUNITY): Payer: Self-pay | Admitting: Internal Medicine

## 2012-09-27 ENCOUNTER — Ambulatory Visit: Payer: Medicaid Other | Admitting: Pulmonary Disease

## 2012-10-18 ENCOUNTER — Telehealth: Payer: Self-pay | Admitting: Licensed Clinical Social Worker

## 2012-10-18 NOTE — Telephone Encounter (Signed)
CSW was awaiting pt's Medicaid to reflect PCP as [facility].  CSW checked and card has been updated to [facility].  Placed call to Ethan Wilcox to discuss referral to [facility].  CSW left message requesting return call. CSW provided contact hours and phone number.

## 2012-10-19 ENCOUNTER — Encounter: Payer: Self-pay | Admitting: Internal Medicine

## 2012-10-19 DIAGNOSIS — Z91013 Allergy to seafood: Secondary | ICD-10-CM | POA: Insufficient documentation

## 2012-10-21 ENCOUNTER — Ambulatory Visit: Payer: Medicaid Other | Admitting: Pulmonary Disease

## 2012-10-21 NOTE — Telephone Encounter (Signed)
CSW discussed prior conversation regarding referral to [facility].  Services and benefits available, reassured Ethan Wilcox no changes with services from [facility].  [facility] is only adding additional community services.  Pt in agreement.  CSW faxed referral to [facility].

## 2012-11-02 ENCOUNTER — Other Ambulatory Visit (HOSPITAL_COMMUNITY): Payer: Self-pay | Admitting: Internal Medicine

## 2012-11-04 ENCOUNTER — Telehealth: Payer: Self-pay | Admitting: *Deleted

## 2012-11-04 NOTE — Telephone Encounter (Signed)
Message fest to front desk

## 2012-11-04 NOTE — Telephone Encounter (Signed)
Call from [facility] with [facility] - # [phone] Case worker wants to report PHQ9 of 9 today. Last  Assessment 10/22 score of 9,  July 28  Score of  6, and in  2011 score of  14 No signs of SI, only mild depression.

## 2012-11-04 NOTE — Telephone Encounter (Signed)
Should make appt with me over the next few weeks for a clinical interview with him.  Thanks

## 2012-11-05 ENCOUNTER — Other Ambulatory Visit: Payer: Self-pay | Admitting: Cardiology

## 2012-11-06 ENCOUNTER — Encounter: Payer: Self-pay | Admitting: Licensed Clinical Social Worker

## 2012-11-06 NOTE — Progress Notes (Unsigned)
Patient ID: Ethan Wilcox, male   DOB: 04-20-1955, 57 y.o.   MRN: [id number] CSW received CMIS message from Blair Promise, LPN, [facility]0J[facility] Care Manager I did a home visit with patient on October 30, 2012. He answered the questions readily. I identified some factors that may impact his overall health. The patient can't read but he states they can read enough to satisfy himself. He can read the number of times to take the medication but doesn't know why he is taking the medication. I have contacted pharmacy at [facility] to contact the patient's local pharmacy [facility] to see if he they can provide a different method of labeling the bottle so he is knowledgeable in what he is taking; he doesn't want to change pharmacies at this time. He can't ambulate w/o being SOB. I asked if he would like PT to work with him with increasing his mobility his reply "no." The patient has a walker in the home with limited utilization. He asked for a wheelchair and that will motivate him. He has difficulty falling and staying asleep and takes Benadryl for sleep but it is ineffective. To add, he has a CPAP machine that is not in use due to it is missing a piece. He states that he has been in pain greater than 3 months which limits his activity; the pain is related to back and gout per patient. I offered a referral to our nutritionist due to his obesity, HTN and gout and he declined. We discussed eating foods that are low in sodium and to avoid can goods; he verbalized understanding. Also, advised to eat more baked food instead of fried. In the past the patient was seen by behavioral health he states 30 years ago c/o depression with PHQ-9 score of 9 (mild depression) offered behavioral referral, patient refused. The patient needs the following [facility] (list of providers given) flu shot(stated he would schedule with PCP), PCP appointment for patch for smoking cessation; stated he was on Chantix but like the side effects "behavior." Patient goals are:  'I want to lose weight", "I want to stop smoking" and "I want to be seen by a eye doctor." I emphasized the symptoms of CHF, use of [facility] transportation and continual care with PCP. The patient doesn't want the help however but stated he would accept case management. He said the only reason he allowed me to come because I kept calling. ;-)... If you would like me to aim at anything else let me know. This is my first PCP referral and if I haven't done something let me know.  CSW replied that pt's current med list does not include Benadryl. Pt should be encouraged to maintain/reschedule canceled appt's.  Note will be forwarded to PCP.

## 2012-11-14 ENCOUNTER — Ambulatory Visit: Payer: Medicaid Other | Admitting: Pulmonary Disease

## 2012-11-21 ENCOUNTER — Ambulatory Visit: Payer: Medicaid Other | Admitting: Cardiology

## 2012-11-25 ENCOUNTER — Ambulatory Visit (INDEPENDENT_AMBULATORY_CARE_PROVIDER_SITE_OTHER): Payer: Medicaid Other | Admitting: Cardiology

## 2012-11-25 ENCOUNTER — Encounter: Payer: Self-pay | Admitting: Cardiology

## 2012-11-25 ENCOUNTER — Encounter: Payer: Self-pay | Admitting: *Deleted

## 2012-11-25 VITALS — BP 130/82 | HR 113 | Ht 67.0 in | Wt 264.1 lb

## 2012-11-25 DIAGNOSIS — I5022 Chronic systolic (congestive) heart failure: Secondary | ICD-10-CM

## 2012-11-25 DIAGNOSIS — F17200 Nicotine dependence, unspecified, uncomplicated: Secondary | ICD-10-CM

## 2012-11-25 DIAGNOSIS — G4733 Obstructive sleep apnea (adult) (pediatric): Secondary | ICD-10-CM

## 2012-11-25 MED ORDER — SPIRONOLACTONE 25 MG PO TABS: 25.0000 mg | ORAL_TABLET | Freq: Every day | ORAL | Status: DC

## 2012-11-25 MED ORDER — LOSARTAN POTASSIUM 25 MG PO TABS: 25.0000 mg | ORAL_TABLET | Freq: Every day | ORAL | Status: DC

## 2012-11-25 MED ORDER — FUROSEMIDE 40 MG PO TABS: ORAL_TABLET | ORAL | Status: DC

## 2012-11-25 NOTE — Progress Notes (Signed)
Patient ID: Ethan Wilcox, male   DOB: March 28, 1955, 57 y.o.   MRN: [id number] PCP: Dr. Julio Wilcox  57 yo with history of nonischemic cardiomyopathy presents for cardiology followup. Ethan Wilcox had a prolonged admission in 5/11 for CHF. Echo showed EF 15-20% with diffuse hypokinesis. Left and right heart cath showed no angiographic CAD and Fick CI 1.47. Patient became hypotensive with ARF (cardiogenic shock) requiring milrinone and dopamine. He was gradually titrated off these medications and begun on an oral regimen.  He saw Dr. Ladona Wilcox regarding an ICD but actually at that time admitted to resuming ETOH intake. Therefore, no ICD yet.  He is still drinking but tells me that he has cut back: a fifth lasts most of the week. He still smokes 1/2 ppd.  Last echo in 4/14 showed EF 25-30%, diffuse hypokinesis.  Since I last saw him, he went to the hospital with angioedema.  This actually may have been related to eating shellfish but lisinopril was stopped.  His spironolactone was also stopped because he was mildly hyperkalemic at one point.   Since I last saw him, Ethan Wilcox has gained 16 lbs.  He is short of breath after walking 50 feet.  He is orthopneic and sleeps on multiple pillows.  He is not really watching his diet.  No chest pain.  No syncope/palpitations.  He has a lot of low back pain and foot pain that has been limiting as much as the dyspnea.   Labs (5/11): K 4.4, creatinine 1.23, LDL 104, HDL 33  Labs (6/11): BNP 97, K 4.8, creatinine 1.4  Labs (7/11): K 4.8, creatinine 1.0, BNP 53  Labs (10/11): BNP 25, K 4.8 => 4.3, creatinine 1.4 => 1.3  Labs (12/11): BNP 25, K 4.9, creatinine 1.2  Labs (3/12): K 4.3, creatinine 1.2 Labs (11/12): K 4, creatinine 1.3, BNP 82, TGs 518, HDL 39, HDL 60 Labs (2/13): AST 72, ALT 96 Labs (4/13): K 4.4, creatinine 0.95, proBNP 185 Labs (5/13): K 4.3, creatinine 1.2, BNP 32 Labs (7/13): K 4.1, creatinine 1.2, BNP 15 Labs (8/13): K 4.6, creatinine 1.0 Labs (11/13): K  3.9, creatinine 1.1, BNP 32 Labs (4/14): K 4.8, creatinine 1.2, BNP 28 Labs (8/14): K 4.1, creatinine 1.0  Allergies (verified):  1) ! Penicillin   Past Medical History:  1. Nonischemic Cardiomyopathy: Admitted 5/11 with acute systolic CHF. LHC showed no angiographic CAD. Echo (5/11) showed EF 15-20% with diffuse severe hypokinesis, moderate Ethan. RHC showed PCWP 10, CI 1.47 (Fick).  Echo (4/14) with EF 35%, diffuse hypokinesis.  Cause of CMP thought to be heavy ETOH and cocaine. No ICD yet due to ongoing ETOH intake.  2. ETOH abuse 3. Cocaine abuse: Quit in 2011  4. Active smoker: 1/2 ppd  5. Obesity  6. OSA: Severe 7. Angioedema: ? ACEI or shellfish related.    Family History:  "Heart disease" in mother   Social History:  Worked as [employer]. Smokes 1/2 ppd. 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 Heavy ETOH in the past, now drinking around a fifth a week.    ROS: All systems reviewed and negative except as per HPI.    Current Outpatient Prescriptions  Medication Sig Dispense Refill  . albuterol (PROVENTIL HFA) 108 (90 BASE) MCG/ACT inhaler Inhale 2 puffs into the lungs every 4 (four) hours as needed. For shortness of breath      . aspirin (ASPIR-LOW) 81 MG EC tablet Take 81 mg by mouth daily.       . carvedilol (  COREG) 25 MG tablet Take 1 tablet (25 mg total) by mouth 2 (two) times daily.  60 tablet  6  . diphenhydrAMINE (SOMINEX) 25 MG tablet Take 25 mg by mouth at bedtime as needed for sleep.      . diphenhydramine-acetaminophen (TYLENOL PM) 25-500 MG TABS Take 1 tablet by mouth every 6 (six) hours.       [name] EPINEPHrine (EPIPEN) 0.3 mg/0.3 mL SOAJ injection Inject 0.3 mL (0.3 mg total) into the muscle once as needed (anaphylaxis).  1 Device  1  . isosorbide-hydrALAZINE (BIDIL) 20-37.5 MG per tablet Take 1/2 tablet three times a day      . Multiple Vitamin (MULTIVITAMIN WITH MINERALS) TABS Take 1 tablet by mouth daily.      [name] omeprazole (PRILOSEC) 20 MG capsule Take 20 mg by mouth 2 (two)  times daily.      . potassium chloride SA (K-DUR,KLOR-CON) 20 MEQ tablet Take 20 mEq by mouth 2 (two) times daily.      . furosemide (LASIX) 40 MG tablet 1 and 1/2 tablets (total 60mg ) two times a day  90 tablet  3  . losartan (COZAAR) 25 MG tablet Take 1 tablet (25 mg total) by mouth daily.  30 tablet  3  . spironolactone (ALDACTONE) 25 MG tablet Take 1 tablet (25 mg total) by mouth daily.  30 tablet  3   No current facility-administered medications for this visit.    BP 130/82  Pulse 113  Ht 5\' 7"  (1.702 m)  Wt 264 lb 1.9 oz (119.804 kg)  BMI 41.36 kg/m2 General: NAD, obese Neck: Thick, JVP difficult but appears elevated to 10-12 cm, no thyromegaly or thyroid nodule.  Lungs: Decreased breath sounds at bases.  CV: Nondisplaced PMI.  Heart regular S1/S2, no S3/S4, no murmur.  No peripheral edema.  No carotid bruit.  Normal pedal pulses.  Abdomen: Soft, nontender, no hepatosplenomegaly, no distention.  Neurologic: Alert and oriented x 3.  Psych: Normal affect. Extremities: No clubbing or cyanosis.   Assessment/Plan:  CHRONIC SYSTOLIC HEART FAILURE  Nonischemic cardiomyopathy, possibly ETOH-related.  Echo in 4/14 with EF 25-30%. Weight is up 16 lbs.  He has worsening NYHA class III symptoms.  He is still drinking but less.  - Continue current doses of Coreg and Bidil. He has not tolerated uptitration of Bidil due to headache.  - Start losartan 25 mg daily.  I am not sure that angioedema was due to ACEI to begin with and even if it was, having angioedema with ARB when one has had angioedema with ACEI is very rare.  - Restart spironolactone 25 mg daily.  - Increase Lasix to 60 mg bid. He is not on KCl with recent mild K elevation.  Will leave off for now as we are starting losartan and spironolactone.  - BMET in 1 week.   - RHC later this week to get a better idea of his filling pressures and cardiac output.  - Not candidate for ICD until he can quit drinking => I reiterated this today.   - Still needs to work on diet. Cut out high sodium foods.  OSA  Wearing CPAP, doing better. TOBACCO ABUSE I counselled him again to quit smoking.   [name] 11/25/2012

## 2012-11-25 NOTE — Patient Instructions (Signed)
Increase lasix (furosemide) 60mg  two times a day. This will be 1 and 1/2 of your 40mg  tablet two times a day.  Start spironolactone 25mg  daily.  Start losartan 25 mg daily.  Your physician recommends that you return for lab work in: 1 week--BMET.  Your physician has requested that you have a cardiac catheterization. Cardiac catheterization is used to diagnose and/or treat various heart conditions. See instruction sheet. Thursday November 20,2014.   Your physician recommends that you schedule a follow-up appointment in: 2 weeks in the [facility] with Dr Shirlee Latch.

## 2012-11-28 ENCOUNTER — Encounter (HOSPITAL_COMMUNITY): Admission: RE | Payer: Self-pay | Source: Ambulatory Visit

## 2012-11-28 ENCOUNTER — Ambulatory Visit (HOSPITAL_COMMUNITY): Admission: RE | Admit: 2012-11-28 | Payer: Medicaid Other | Source: Ambulatory Visit | Admitting: Cardiology

## 2012-11-28 ENCOUNTER — Ambulatory Visit (HOSPITAL_COMMUNITY): Admit: 2012-11-28 | Payer: Self-pay | Admitting: Cardiology

## 2012-11-28 ENCOUNTER — Other Ambulatory Visit (HOSPITAL_COMMUNITY): Payer: Self-pay | Admitting: Cardiology

## 2012-11-28 ENCOUNTER — Encounter (HOSPITAL_COMMUNITY): Payer: Self-pay

## 2012-11-28 DIAGNOSIS — I509 Heart failure, unspecified: Secondary | ICD-10-CM

## 2012-11-28 SURGERY — RIGHT HEART CATH
Anesthesia: Moderate Sedation

## 2012-11-28 SURGERY — RIGHT HEART CATH
Anesthesia: LOCAL | Laterality: Right

## 2012-12-02 ENCOUNTER — Other Ambulatory Visit: Payer: Medicaid Other

## 2012-12-03 ENCOUNTER — Telehealth: Payer: Self-pay | Admitting: Cardiology

## 2012-12-03 ENCOUNTER — Other Ambulatory Visit (INDEPENDENT_AMBULATORY_CARE_PROVIDER_SITE_OTHER): Payer: Medicaid Other

## 2012-12-03 DIAGNOSIS — I5022 Chronic systolic (congestive) heart failure: Secondary | ICD-10-CM

## 2012-12-03 LAB — BASIC METABOLIC PANEL
CO2: 29 mEq/L (ref 19–32)
Chloride: 98 mEq/L (ref 96–112)
Glucose, Bld: 113 mg/dL — ABNORMAL HIGH (ref 70–99)
Potassium: 3.8 mEq/L (ref 3.5–5.1)
Sodium: 136 mEq/L (ref 135–145)

## 2012-12-03 NOTE — Telephone Encounter (Signed)
Pt states he was out  of KCL supplement when he saw Dr Shirlee Latch 11/25/12 and he has been out of it for awhile. He was asking for refill since meds were adjusted at that visit. Pt was due for BMET yesterday. Pt will come for BMET today. Pt advised once lab reviewed by Dr Shirlee Latch I will follow up with him about KCL refill.

## 2012-12-03 NOTE — Telephone Encounter (Signed)
New problem   Pt need to speak to nurse concerning a medication that Dr Shirlee Latch didn't give him for potassium. Please call pt.

## 2012-12-03 NOTE — Telephone Encounter (Signed)
K 3.8 today--pt advised I will call him back after Dr Shirlee Latch reviews results.

## 2012-12-04 NOTE — Telephone Encounter (Signed)
LM lab OK and does not need K supplement at this time

## 2012-12-10 ENCOUNTER — Encounter (HOSPITAL_COMMUNITY): Payer: Medicaid Other

## 2012-12-10 ENCOUNTER — Telehealth: Payer: Self-pay | Admitting: *Deleted

## 2012-12-10 NOTE — Telephone Encounter (Signed)
Returned call to pt at [phone] at 3:40PM - left message was returning his call and clinic would be open till 5PM. It is 4:50PM - no word from pt. Stanton Kidney Lucien Budney RN 12/10/12 4:50PM

## 2012-12-11 ENCOUNTER — Encounter: Payer: Medicaid Other | Admitting: Internal Medicine

## 2012-12-16 ENCOUNTER — Encounter: Payer: Self-pay | Admitting: Licensed Clinical Social Worker

## 2012-12-16 NOTE — Progress Notes (Unsigned)
Patient ID: Ethan Wilcox, male   DOB: 20-Nov-1955, 57 y.o.   MRN: [id number] CSW received voice mail from [name], [name].  Per message from [name], pt can continue to use his CPAP.  Care Mgr took CPAP to [name].  [name] states broken piece is r/t heat and humidity, [name] states CPAP is still operable.  The CPAP machine can be fixed however the cost would be $75 plus parts/labor due to insurance not covering repair due to "abuse of machine".  Care Mgr returned CPAP to pt and notified family member.

## 2012-12-19 ENCOUNTER — Telehealth: Payer: Self-pay | Admitting: Cardiology

## 2012-12-19 NOTE — Telephone Encounter (Signed)
New problem    Documentation and office visit stating the number of feet the patient can ambulate.    If assisted or not assisted  with cane or walker   If patient is not ambulatory please  advise. Any notes that speaks the needs of wheelchairs.

## 2012-12-20 ENCOUNTER — Ambulatory Visit: Payer: Medicaid Other | Admitting: Pulmonary Disease

## 2012-12-23 NOTE — Telephone Encounter (Signed)
Chantelle advised will fax Dr Alford Highland 11/25/12 office note to her. She will call me back if she needs further information.

## 2012-12-25 ENCOUNTER — Encounter: Payer: Medicaid Other | Admitting: Internal Medicine

## 2013-01-03 ENCOUNTER — Other Ambulatory Visit: Payer: Self-pay | Admitting: Cardiology

## 2013-01-16 ENCOUNTER — Encounter: Payer: Self-pay | Admitting: Licensed Clinical Social Worker

## 2013-01-16 NOTE — Progress Notes (Signed)
Patient ID: Ethan Wilcox, male   DOB: 30-Sep-1955, 58 y.o.   MRN: [id number] [facility] care management note: Blair Promise LPN Mr. Duff is a PCP referral. He is not complaint with case managing. He was to have cardiac cath but cancelled the appointment. I spoke with patient in great length about the importance of having procedure done. He is past due on labs and hasn't followed up with PCP. I have been unsuccessful on follow up with patient because he isn't a home each time I call.

## 2013-02-22 ENCOUNTER — Encounter (HOSPITAL_COMMUNITY): Payer: Self-pay | Admitting: Emergency Medicine

## 2013-02-22 ENCOUNTER — Emergency Department (HOSPITAL_COMMUNITY)
Admission: EM | Admit: 2013-02-22 | Discharge: 2013-02-22 | Discharge status: Against Medical Advice | Payer: Medicaid Other | Attending: Emergency Medicine | Admitting: Emergency Medicine

## 2013-02-22 ENCOUNTER — Emergency Department (HOSPITAL_COMMUNITY): Payer: Medicaid Other

## 2013-02-22 DIAGNOSIS — Z87892 Personal history of anaphylaxis: Secondary | ICD-10-CM | POA: Insufficient documentation

## 2013-02-22 DIAGNOSIS — Z8719 Personal history of other diseases of the digestive system: Secondary | ICD-10-CM | POA: Insufficient documentation

## 2013-02-22 DIAGNOSIS — F411 Generalized anxiety disorder: Secondary | ICD-10-CM | POA: Insufficient documentation

## 2013-02-22 DIAGNOSIS — F17200 Nicotine dependence, unspecified, uncomplicated: Secondary | ICD-10-CM

## 2013-02-22 DIAGNOSIS — Z79899 Other long term (current) drug therapy: Secondary | ICD-10-CM | POA: Insufficient documentation

## 2013-02-22 DIAGNOSIS — Z880 Allergy status to penicillin: Secondary | ICD-10-CM | POA: Insufficient documentation

## 2013-02-22 DIAGNOSIS — R000 Tachycardia, unspecified: Secondary | ICD-10-CM | POA: Insufficient documentation

## 2013-02-22 DIAGNOSIS — Z7982 Long term (current) use of aspirin: Secondary | ICD-10-CM | POA: Insufficient documentation

## 2013-02-22 DIAGNOSIS — F1021 Alcohol dependence, in remission: Secondary | ICD-10-CM | POA: Insufficient documentation

## 2013-02-22 DIAGNOSIS — R079 Chest pain, unspecified: Secondary | ICD-10-CM

## 2013-02-22 DIAGNOSIS — I10 Essential (primary) hypertension: Secondary | ICD-10-CM

## 2013-02-22 DIAGNOSIS — E669 Obesity, unspecified: Secondary | ICD-10-CM | POA: Insufficient documentation

## 2013-02-22 DIAGNOSIS — I509 Heart failure, unspecified: Secondary | ICD-10-CM | POA: Insufficient documentation

## 2013-02-22 DIAGNOSIS — R0789 Other chest pain: Secondary | ICD-10-CM | POA: Insufficient documentation

## 2013-02-22 LAB — URINALYSIS, ROUTINE W REFLEX MICROSCOPIC
BILIRUBIN URINE: NEGATIVE
Glucose, UA: NEGATIVE mg/dL
Hgb urine dipstick: NEGATIVE
KETONES UR: NEGATIVE mg/dL
Leukocytes, UA: NEGATIVE
NITRITE: NEGATIVE
Protein, ur: NEGATIVE mg/dL
SPECIFIC GRAVITY, URINE: 1.008 (ref 1.005–1.030)
UROBILINOGEN UA: 0.2 mg/dL (ref 0.0–1.0)
pH: 6 (ref 5.0–8.0)

## 2013-02-22 LAB — BASIC METABOLIC PANEL
BUN: 17 mg/dL (ref 6–23)
CO2: 22 mEq/L (ref 19–32)
Calcium: 9.4 mg/dL (ref 8.4–10.5)
Chloride: 97 mEq/L (ref 96–112)
Creatinine, Ser: 1.08 mg/dL (ref 0.50–1.35)
GFR calc Af Amer: 87 mL/min — ABNORMAL LOW (ref >90)
GFR calc non Af Amer: 75 mL/min — ABNORMAL LOW (ref >90)
Glucose, Bld: 100 mg/dL — ABNORMAL HIGH (ref 70–99)
Potassium: 4.1 mEq/L (ref 3.7–5.3)
Sodium: 135 mEq/L — ABNORMAL LOW (ref 137–147)

## 2013-02-22 LAB — CBC
HEMATOCRIT: 44.1 % (ref 39.0–52.0)
Hemoglobin: 15.3 g/dL (ref 13.0–17.0)
MCH: 30.6 pg (ref 26.0–34.0)
MCHC: 34.7 g/dL (ref 30.0–36.0)
MCV: 88.2 fL (ref 78.0–100.0)
Platelets: 301 10*3/uL (ref 150–400)
RBC: 5 MIL/uL (ref 4.22–5.81)
RDW: 14.4 % (ref 11.5–15.5)
WBC: 8.9 10*3/uL (ref 4.0–10.5)

## 2013-02-22 LAB — RAPID URINE DRUG SCREEN, HOSP PERFORMED
Amphetamines: NOT DETECTED
Barbiturates: NOT DETECTED
Benzodiazepines: NOT DETECTED
COCAINE: NOT DETECTED
Opiates: NOT DETECTED
Tetrahydrocannabinol: POSITIVE — AB

## 2013-02-22 LAB — POCT I-STAT TROPONIN I: TROPONIN I, POC: 0.06 ng/mL (ref 0.00–0.08)

## 2013-02-22 NOTE — Discharge Instructions (Signed)
1. Medications: usual home medications 2. Treatment: rest, drink plenty of fluids,  3. Follow Up: Please followup with your primary doctor for discussion of your diagnoses and further evaluation after today's visit;  return to the emergency department for new or worsening symptoms   Chest Pain (Nonspecific) It is often hard to give a specific diagnosis for the cause of chest pain. There is always a chance that your pain could be related to something serious, such as a heart attack or a blood clot in the lungs. You need to follow up with your caregiver for further evaluation. CAUSES   Heartburn.  Pneumonia or bronchitis.  Anxiety or stress.  Inflammation around your heart (pericarditis) or lung (pleuritis or pleurisy).  A blood clot in the lung.  A collapsed lung (pneumothorax). It can develop suddenly on its own (spontaneous pneumothorax) or from injury (trauma) to the chest.  Shingles infection (herpes zoster virus). The chest wall is composed of bones, muscles, and cartilage. Any of these can be the source of the pain.  The bones can be bruised by injury.  The muscles or cartilage can be strained by coughing or overwork.  The cartilage can be affected by inflammation and become sore (costochondritis). DIAGNOSIS  Lab tests or other studies, such as X-rays, electrocardiography, stress testing, or cardiac imaging, may be needed to find the cause of your pain.  TREATMENT   Treatment depends on what may be causing your chest pain. Treatment may include:  Acid blockers for heartburn.  Anti-inflammatory medicine.  Pain medicine for inflammatory conditions.  Antibiotics if an infection is present.  You may be advised to change lifestyle habits. This includes stopping smoking and avoiding alcohol, caffeine, and chocolate.  You may be advised to keep your head raised (elevated) when sleeping. This reduces the chance of acid going backward from your stomach into your  esophagus.  Most of the time, nonspecific chest pain will improve within 2 to 3 days with rest and mild pain medicine. HOME CARE INSTRUCTIONS   If antibiotics were prescribed, take your antibiotics as directed. Finish them even if you start to feel better.  For the next few days, avoid physical activities that bring on chest pain. Continue physical activities as directed.  Do not smoke.  Avoid drinking alcohol.  Only take over-the-counter or prescription medicine for pain, discomfort, or fever as directed by your caregiver.  Follow your caregiver's suggestions for further testing if your chest pain does not go away.  Keep any follow-up appointments you made. If you do not go to an appointment, you could develop lasting (chronic) problems with pain. If there is any problem keeping an appointment, you must call to reschedule. SEEK MEDICAL CARE IF:   You think you are having problems from the medicine you are taking. Read your medicine instructions carefully.  Your chest pain does not go away, even after treatment.  You develop a rash with blisters on your chest. SEEK IMMEDIATE MEDICAL CARE IF:   You have increased chest pain or pain that spreads to your arm, neck, jaw, back, or abdomen.  You develop shortness of breath, an increasing cough, or you are coughing up blood.  You have severe back or abdominal pain, feel nauseous, or vomit.  You develop severe weakness, fainting, or chills.  You have a fever. THIS IS AN EMERGENCY. Do not wait to see if the pain will go away. Get medical help at once. Call your local emergency services (911 in [place] Do not drive  yourself to the hospital. MAKE SURE YOU:   Understand these instructions.  Will watch your condition.  Will get help right away if you are not doing well or get worse. Document Released: 10/05/2004 Document Revised: 03/20/2011 Document Reviewed: 08/01/2007 [facility] Patient Information 2014 [place], [place].

## 2013-02-22 NOTE — ED Notes (Signed)
PA at bedside.

## 2013-02-22 NOTE — ED Notes (Signed)
Rec'd report from off-going RN.  Pt ambulatory from [facility], denies pain.  Feels he is "good".  Awaiting last test result.

## 2013-02-22 NOTE — ED Notes (Signed)
Pt reports left sided chest pain onset 1 hour ago while smoking a cigarette. Pt denies any other symptoms.

## 2013-02-22 NOTE — ED Provider Notes (Signed)
CSN: [id number]     Arrival date & time 02/22/13  1751 History   First MD Initiated Contact with Patient 02/22/13 1814     Chief Complaint  Patient presents with  . Chest Pain     (Consider location/radiation/quality/duration/timing/severity/associated sxs/prior Treatment) Patient is a 58 y.o. male presenting with chest pain. The history is provided by the patient and medical records. No language interpreter was used.  Chest Pain Associated symptoms: no abdominal pain, no back pain, no cough, no diaphoresis, no fatigue, no fever, no headache, no nausea, no shortness of breath and not vomiting     Ethan Wilcox is a 58 y.o. male  with a hx of alcoholic nonischemic cardiomyopathy, alcohol abuse, cocaine abuse, active smoker, obesity, hypertension, CHF, GERD presents to the Emergency Department complaining of acute, persistent, gradually improving left-sided chest pain without associated symptoms beginning approximately one hour prior to arrival.  Patient reports history of cocaine abuse but denies any today. He reports he had the exact same pain yesterday which lasted about 10 minutes but went away after going to sleep. Does report that his pain today began while he was smoking a cigarette.  Nothing makes the symptoms better or worse.  He denies all associated symptoms including fever, chills, headache, neck pain, shortness of breath, abdominal pain nausea, vomiting, diarrhea, weakness, dizziness, syncope, dysuria, hematuria.    Past Medical History  Diagnosis Date  . Nonischemic cardiomyopathy     admitted 5/11 w acute systolic CHF. LHC showed no angiographic CAD. Echo (5/11) showed EF 15-20% w diffuse sever hypokinesis, moderate MR> RHC showed PCWP 10, CI 1.47 (Fick.) cause of CMP thought to be heavy ETOH and cocaine. No ICD yet due to ongoing ETOH intake  . ETOH abuse   . Cocaine abuse   . Active smoker /    1/2 ppd  . Obesity   . Hypertension   . CHF (congestive heart failure)   .  Gout   . GERD (gastroesophageal reflux disease)   . Sleep apnea   . 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 No past surgeries     Family History  Problem Relation Age of Onset  . Heart disease Mother   . Cancer Mother   . Heart disease Father    History  Substance Use Topics  . Smoking status: Current Every Day Smoker -- 0.50 packs/day for 38 years    Types: Cigarettes  . Smokeless tobacco: Not on file     Comment: was on patches.  Was stopped.  Restarted smoking  . Alcohol Use: 7.2 oz/week    12 Shots of liquor per week     Comment: Vodka 1/2 pint 3 days a week    Review of Systems  Constitutional: Negative for fever, diaphoresis, appetite change, fatigue and unexpected weight change.  HENT: Negative for mouth sores.   Eyes: Negative for visual disturbance.  Respiratory: Negative for cough, chest tightness, shortness of breath and wheezing.   Cardiovascular: Positive for chest pain.  Gastrointestinal: Negative for nausea, vomiting, abdominal pain, diarrhea and constipation.  Endocrine: Negative for polydipsia, polyphagia and polyuria.  Genitourinary: Negative for dysuria, urgency, frequency and hematuria.  Musculoskeletal: Negative for back pain and neck stiffness.  Skin: Negative for rash.  Allergic/Immunologic: Negative for immunocompromised state.  Neurological: Negative for syncope, light-headedness and  headaches.  Hematological: Does not bruise/bleed easily.  Psychiatric/Behavioral: Negative for sleep disturbance. The patient is not nervous/anxious.       Allergies  Shrimp; Lisinopril; and Penicillins  Home Medications   Current Outpatient Rx  Name  Route  Sig  Dispense  Refill  . aspirin (ASPIR-LOW) 81 MG EC tablet   Oral   Take 81 mg by mouth daily.          . diphenhydrAMINE  (SOMINEX) 25 MG tablet   Oral   Take 25 mg by mouth at bedtime as needed for sleep.         . diphenhydramine-acetaminophen (TYLENOL PM) 25-500 MG TABS   Oral   Take 1 tablet by mouth every 6 (six) hours.          . furosemide (LASIX) 40 MG tablet      1 and 1/2 tablets (total 60mg ) two times a day   90 tablet   3   . isosorbide-hydrALAZINE (BIDIL) 20-37.5 MG per tablet   Oral   Take 1 tablet by mouth 3 (three) times daily.         . Multiple Vitamin (MULTIVITAMIN WITH MINERALS) TABS   Oral   Take 1 tablet by mouth daily.         [name]. spironolactone (ALDACTONE) 25 MG tablet   Oral   Take 1 tablet (25 mg total) by mouth daily.   30 tablet   3   . EPINEPHrine (EPIPEN) 0.3 mg/0.3 mL SOAJ injection   Intramuscular   Inject 0.3 mL (0.3 mg total) into the muscle once as needed (anaphylaxis).   1 Device   1    BP 113/76  Pulse 68  Temp(Src) 98.2 F (36.8 C) (Oral)  Resp 16  Ht 5\' 6"  (1.676 m)  Wt 260 lb (117.935 kg)  BMI 41.99 kg/m2  SpO2 95% Physical Exam  Nursing note and vitals reviewed. Constitutional: He is oriented to person, place, and time. He appears well-developed and well-nourished. No distress.  Awake, alert, nontoxic appearance  HENT:  Head: Normocephalic and atraumatic.  Mouth/Throat: Oropharynx is clear and moist. No oropharyngeal exudate.  Eyes: Conjunctivae are normal. No scleral icterus.  Neck: Normal range of motion. Neck supple.  Cardiovascular: Regular rhythm, normal heart sounds and intact distal pulses.   No murmur heard. Mild tachycardia  Pulmonary/Chest: Effort normal and breath sounds normal. No accessory muscle usage. Not tachypneic. No respiratory distress. He has no decreased breath sounds. He has no wheezes. He has no rhonchi. He has no rales. He exhibits no tenderness.  Clinical breath sounds, no rales or rhonchi No Tenderness to anterior chest or bilateral ribs  Abdominal: Soft. Bowel sounds are normal. He exhibits no mass.  There is no tenderness. There is no rebound and no guarding.  Obese Abdomen soft and nontender No evidence of ascites  Musculoskeletal: Normal range of motion. He exhibits no edema.  Neurological: He is alert and oriented to person, place, and time.  Speech is clear and goal oriented Moves extremities without ataxia  Skin: Skin is warm and dry. He is not diaphoretic. No erythema.  Psychiatric: He has a normal mood and affect.    ED Course  Procedures (including critical care time) Labs Review Labs Reviewed  BASIC METABOLIC PANEL - Abnormal; Notable for the following:    Sodium 135 (*)    Glucose, Bld 100 (*)    GFR calc non Af Amer 75 (*)    GFR calc Af Amer 87 (*)  All other components within normal limits  URINE RAPID DRUG SCREEN (HOSP PERFORMED) - Abnormal; Notable for the following:    Tetrahydrocannabinol POSITIVE (*)    All other components within normal limits  CBC  URINALYSIS, ROUTINE W REFLEX MICROSCOPIC  POCT I-STAT TROPONIN I   Imaging Review Dg Chest 2 View (if Patient Has Fever And/or Copd)  02/22/2013   CLINICAL DATA:  Left chest pain  EXAM: CHEST  2 VIEW  COMPARISON:  Prior chest x-ray 08/23/2012  FINDINGS: Stable cardiomegaly and mediastinal contours. No focal airspace consolidation, pleural effusion or pneumothorax. No pulmonary edema. Stable mild bronchitic changes. No acute osseous abnormality. .  IMPRESSION: Stable cardiomegaly.  No acute cardiopulmonary process.   Electronically Signed   By: Malachy Moan M.D.   On: 02/22/2013 19:06    EKG Interpretation    Date/Time:  Saturday February 22 2013 17:55:36 EST Ventricular Rate:  103 PR Interval:  134 QRS Duration: 94 QT Interval:  370 QTC Calculation: 484 R Axis:   -7 Text Interpretation:  Sinus tachycardia Otherwise normal ECG No significant change since last tracing Confirmed by Anitra Lauth  MD, WHITNEY ([id number]) on 02/22/2013 6:14:00 PM            MDM   Final diagnoses:  Chest pain  TOBACCO  ABUSE  HYPERTENSION   Ethan Wilcox presents with a 1.5 hours of chest pain which began while smoking a cigarette.  Patient is followed by lobar cardiology by Dr. Shirlee Latch for history of nonischemic cardiomyopathy.  Reports that he is chest pain-free at this time, but does want to get checked. He denies all other associated symptoms.  Patient with mild tachycardia but he appears anxious.  8:34 PM UA without evidence of urinary tract infection, UTI without cocaine, positive for marijuana. Troponin 0.06. CBC unremarkable and BMP reassuring.  Chest without evidence of pneumonia, pulmonary edema or pneumothorax. Stable cardiomegaly noted.  Tachycardia has resolved.  I personally reviewed the imaging tests through PACS system.  I reviewed available ER/hospitalization records through the EMR.    At this time patient requests to go home. I discussed with him at length my concerns about his risk for acute cardiac syndrome and the fact that we have not completed what I would consider a full evaluation. He reports that this does not matter to him, that his life is already ruined and that he wants to go home.  I discussed with the patient my concerns for possibility of acute MI, the risk of returning home without a complete workup including death or disability. Patient states he understands and he wishes to be discharged. He reports he will followup with his primary care physician.  Patient currently has both competency and capacity to make this decision. He is alert, oriented, nontoxic and nonseptic appearing. He is able to answer my questions. He states understanding of what would happen if he was to have heart attack at home.  I have advised him that he may return to this emergency department for a full workup and further evaluation at any point and strongly encouraged him to do so if his chest pain returned.  Patient to be discharged AMA.  The patient was discussed with Dr. Anitra Lauth who is aware that the  patient intends to leave AMA.  She witnessed my discussion with him about my concerns and heard the patients wishes to leave.     Dahlia Client Alaijah Gibler, PA-C 02/22/13 2053

## 2013-02-23 NOTE — ED Provider Notes (Signed)
Medical screening examination/treatment/procedure(s) were conducted as a shared visit with non-physician practitioner(s) and myself.  I personally evaluated the patient during the encounter.  EKG Interpretation    Date/Time:  Saturday February 22 2013 17:55:36 EST Ventricular Rate:  103 PR Interval:  134 QRS Duration: 94 QT Interval:  370 QTC Calculation: 484 R Axis:   -7 Text Interpretation:  Sinus tachycardia Otherwise normal ECG No significant change since last tracing Confirmed by Anitra Lauth  MD, Salvadore Valvano ([id number]) on 02/22/2013 6:14:00 PM            Pt with atypical cp but several cardiac risk factors including cocaine abuse and alcoholic cardiomyopathy.  Pt initial trop neg but pt has not had full eval.  Dahlia Client spoke in length with the pt which I witnessed and pt still wishes to leave.  Gwyneth Sprout, MD 02/23/13 1159

## 2013-02-26 ENCOUNTER — Ambulatory Visit (INDEPENDENT_AMBULATORY_CARE_PROVIDER_SITE_OTHER): Payer: Medicaid Other | Admitting: Internal Medicine

## 2013-02-26 ENCOUNTER — Other Ambulatory Visit: Payer: Self-pay | Admitting: Internal Medicine

## 2013-02-26 ENCOUNTER — Encounter: Payer: Self-pay | Admitting: Internal Medicine

## 2013-02-26 VITALS — BP 142/85 | HR 86 | Temp 98.0°F | Ht 67.0 in | Wt 266.4 lb

## 2013-02-26 DIAGNOSIS — H547 Unspecified visual loss: Secondary | ICD-10-CM

## 2013-02-26 DIAGNOSIS — I5022 Chronic systolic (congestive) heart failure: Secondary | ICD-10-CM

## 2013-02-26 DIAGNOSIS — K219 Gastro-esophageal reflux disease without esophagitis: Secondary | ICD-10-CM | POA: Insufficient documentation

## 2013-02-26 DIAGNOSIS — I509 Heart failure, unspecified: Secondary | ICD-10-CM

## 2013-02-26 DIAGNOSIS — F32A Depression, unspecified: Secondary | ICD-10-CM | POA: Insufficient documentation

## 2013-02-26 DIAGNOSIS — I10 Essential (primary) hypertension: Secondary | ICD-10-CM

## 2013-02-26 DIAGNOSIS — F329 Major depressive disorder, single episode, unspecified: Secondary | ICD-10-CM | POA: Insufficient documentation

## 2013-02-26 DIAGNOSIS — Z299 Encounter for prophylactic measures, unspecified: Secondary | ICD-10-CM

## 2013-02-26 DIAGNOSIS — F1010 Alcohol abuse, uncomplicated: Secondary | ICD-10-CM

## 2013-02-26 DIAGNOSIS — M1990 Unspecified osteoarthritis, unspecified site: Secondary | ICD-10-CM | POA: Insufficient documentation

## 2013-02-26 DIAGNOSIS — M129 Arthropathy, unspecified: Secondary | ICD-10-CM

## 2013-02-26 DIAGNOSIS — G4733 Obstructive sleep apnea (adult) (pediatric): Secondary | ICD-10-CM

## 2013-02-26 DIAGNOSIS — F17200 Nicotine dependence, unspecified, uncomplicated: Secondary | ICD-10-CM

## 2013-02-26 LAB — BASIC METABOLIC PANEL WITH GFR
BUN: 17 mg/dL (ref 6–23)
CO2: 24 mEq/L (ref 19–32)
Calcium: 9.9 mg/dL (ref 8.4–10.5)
Chloride: 99 mEq/L (ref 96–112)
Creat: 1.09 mg/dL (ref 0.50–1.35)
GFR, EST AFRICAN AMERICAN: 87 mL/min
GFR, EST NON AFRICAN AMERICAN: 75 mL/min
Glucose, Bld: 111 mg/dL — ABNORMAL HIGH (ref 70–99)
POTASSIUM: 4.1 meq/L (ref 3.5–5.3)
SODIUM: 136 meq/L (ref 135–145)

## 2013-02-26 MED ORDER — LOSARTAN POTASSIUM 25 MG PO TABS: 25.0000 mg | ORAL_TABLET | Freq: Every day | ORAL | Status: DC

## 2013-02-26 MED ORDER — NAPROXEN SODIUM 220 MG PO TABS: 220.0000 mg | ORAL_TABLET | Freq: Two times a day (BID) | ORAL | Status: DC | PRN

## 2013-02-26 MED ORDER — OMEPRAZOLE 40 MG PO CPDR: 40.0000 mg | DELAYED_RELEASE_CAPSULE | Freq: Every day | ORAL | Status: DC

## 2013-02-26 MED ORDER — CARVEDILOL 25 MG PO TABS: 25.0000 mg | ORAL_TABLET | Freq: Two times a day (BID) | ORAL | Status: DC

## 2013-02-26 NOTE — Patient Instructions (Addendum)
General Instructions: Please take Aleve 1-2 pills twice daily as needed for pain  Please fix your CPAP machine and start using it. Please take your medications as before  I will check your potassium level today  I strongly recommend that you follow up with your heart doctor and get catheterization of you heart.  Please follow up here in 3 months.     Treatment Goals:  Goals (1 Years of Data) as of 02/26/13         As of Today 02/22/13 02/22/13 02/22/13 02/22/13     Blood Pressure    . Blood Pressure < 140/90  142/85 113/76 138/85 138/85 107/82      Progress Toward Treatment Goals:  Treatment Goal 02/26/2013  Blood pressure at goal  Stop smoking smoking the same amount    Self Care Goals & Plans:  Self Care Goal 02/26/2013  Manage my medications take my medicines as prescribed; bring my medications to every visit; refill my medications on time; follow the sick day instructions if I am sick  Eat healthy foods -    No flowsheet data found.   Care Management & Community Referrals:  No flowsheet data found.

## 2013-02-28 NOTE — Assessment & Plan Note (Addendum)
Encourage the patient to show up for his right heart catheterization as recommended by cardiology.

## 2013-02-28 NOTE — Assessment & Plan Note (Signed)
BP Readings from Last 3 Encounters:  02/26/13 142/85  02/22/13 113/76  11/25/12 130/82    Lab Results  Component Value Date   NA 136 02/26/2013   K 4.1 02/26/2013   CREATININE 1.09 02/26/2013    Assessment: Blood pressure control: controlled Progress toward BP goal:  at goal Comments:   Plan: Medications:  continue current medications Educational resources provided:   Self management tools provided:   Other plans: follow up at three months.

## 2013-02-28 NOTE — Assessment & Plan Note (Signed)
Recommended aleve.

## 2013-02-28 NOTE — Progress Notes (Signed)
Patient ID: Ethan Wilcox, male   DOB: 03-Dec-1955, 58 y.o.   MRN: [id number]   Subjective:   HPI: [name] is a 58 y.o. a gentleman, with past medical history of congestive heart failure, secondary to nonischemic cardiomyopathy, hypertension, obesity, obstructive sleep apnea, excessive alcohol use and cigarette smoking, presents for a routine follow up visit.  Complaining of pain in the right foot for the last several days which he attributes to arthritis and cold weather. No constitutional symptoms. No new complaints.   He recently declined to go for his LHC as recommended by cards.    Please see the A&P for the status of the pt's chronic medical problems.    Past Medical History  Diagnosis Date  . Nonischemic cardiomyopathy     admitted 5/11 w acute systolic CHF. LHC showed no angiographic CAD. Echo (5/11) showed EF 15-20% w diffuse sever hypokinesis, moderate MR> RHC showed PCWP 10, CI 1.47 (Fick.) cause of CMP thought to be heavy ETOH and cocaine. No ICD yet due to ongoing ETOH intake  . ETOH abuse   . Cocaine abuse   . Active smoker /    1/2 ppd  . Obesity   . Hypertension   . CHF (congestive heart failure)   . Gout   . GERD (gastroesophageal reflux disease)   . Sleep apnea   . 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 aspirin (ASPIR-LOW) 81 MG EC tablet Take 81 mg by mouth daily.       [name] EPINEPHrine (EPIPEN) 0.3 mg/0.3 mL SOAJ injection Inject 0.3 mL (0.3 mg total) into the muscle once as needed (anaphylaxis).  1 Device  1  . furosemide (LASIX) 40 MG tablet 1 and 1/2 tablets (total 60mg ) two times a day  90 tablet  3  . isosorbide-hydrALAZINE (BIDIL) 20-37.5 MG per tablet Take 1 tablet by mouth 3 (three) times daily.      . Multiple Vitamin (MULTIVITAMIN WITH MINERALS) TABS  Take 1 tablet by mouth daily.      [name] spironolactone (ALDACTONE) 25 MG tablet Take 1 tablet (25 mg total) by mouth daily.  30 tablet  3  . carvedilol (COREG) 25 MG tablet Take 1 tablet (25 mg total) by mouth 2 (two) times daily.  60 tablet  11  . losartan (COZAAR) 25 MG tablet Take 1 tablet (25 mg total) by mouth daily.  30 tablet  11  . naproxen sodium (ALEVE) 220 MG tablet Take 1 tablet (220 mg total) by mouth 2 (two) times daily as needed.  30 tablet  0  . omeprazole (PRILOSEC) 40 MG capsule TAKE ONE CAPSULE BY MOUTH DAILY BEFORE BREAKFAST  90 capsule  1   No current facility-administered medications for this visit.   Family History  Problem Relation Age of Onset  . Heart disease Mother   . Cancer Mother   . Heart disease Father    History   Social History  . Marital Status: Single    Spouse Name: N/A    Number of Children: N/A  . Years of Education: N/A   Occupational History  . DISABLED    Social History Main Topics  . Smoking status: Current Every Day Smoker -- 0.50 packs/day for 38 years    Types: Cigarettes  . Smokeless tobacco: None  Comment: was on patches.  Was stopped.  Restarted smoking  . Alcohol Use: 7.2 oz/week    12 Shots of liquor per week     Comment: Vodka 1/2 pint 3 days a week  . Drug Use: Yes    Special: Cocaine     Comment: former  . Sexual Activity: None   Other Topics Concern  . None   Social History Narrative   Works as Gafferhandyman. 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 Heavy ETOH in past, now back to drinking 1/2 pint/week.          No show 07/16/09   Review of Systems: Constitutional: Denies fever, chills, diaphoresis, appetite change and fatigue. Reports day time sleepiness.   Respiratory: SOB and DOE at baseline.  Cardiovascular: No chest pain, palpitations and leg swelling.  Gastrointestinal: No abdominal pain, nausea, vomiting, bloody stools Genitourinary: No dysuria, frequency, hematuria, or flank pain.  Musculoskeletal: No myalgias,  back pain, joint swelling, arthralgias   Right foot exam: no signs of joint inflammation or infection.   Objective:  Physical Exam: Filed Vitals:   02/26/13 1413  BP: 142/85  Pulse: 86  Temp: 98 F (36.7 C)  TempSrc: Oral  Height: 5\' 7"  (1.702 m)  Weight: 266 lb 6.4 oz (120.838 kg)  SpO2: 98%   General: Obese, No acute distress.  Lungs: CTA bilaterally but with heavy breathing. Heart: RRR; no extra sounds or murmurs. JVD not appreciated.  Abdomen: Non-distended, normal BS, soft, nontender; no hepatosplenomegaly  Extremities: No pedal edema. No joint swelling or tenderness. Neurologic: Alert and oriented x3. No obvious neurologic deficits.  Assessment & Plan:  I have discussed my assessment and plan  with Dr. Rogelia BogaButcher as detailed under problem based charting.

## 2013-02-28 NOTE — Assessment & Plan Note (Signed)
Advised him to have his machine fixed.

## 2013-03-03 NOTE — Progress Notes (Signed)
Case discussed with Dr. Kazibwe soon after the resident saw the patient.  We reviewed the resident's history and exam and pertinent patient test results.  I agree with the assessment, diagnosis, and plan of care documented in the resident's note. 

## 2013-03-07 ENCOUNTER — Ambulatory Visit: Payer: Medicaid Other | Admitting: Cardiology

## 2013-03-12 ENCOUNTER — Encounter: Payer: Self-pay | Admitting: *Deleted

## 2013-03-12 ENCOUNTER — Ambulatory Visit (INDEPENDENT_AMBULATORY_CARE_PROVIDER_SITE_OTHER): Payer: Medicaid Other | Admitting: Cardiology

## 2013-03-12 ENCOUNTER — Encounter: Payer: Self-pay | Admitting: Cardiology

## 2013-03-12 VITALS — BP 124/95 | HR 95 | Ht 67.0 in | Wt 269.8 lb

## 2013-03-12 DIAGNOSIS — G4733 Obstructive sleep apnea (adult) (pediatric): Secondary | ICD-10-CM

## 2013-03-12 DIAGNOSIS — I5022 Chronic systolic (congestive) heart failure: Secondary | ICD-10-CM

## 2013-03-12 DIAGNOSIS — F1010 Alcohol abuse, uncomplicated: Secondary | ICD-10-CM

## 2013-03-12 DIAGNOSIS — F17200 Nicotine dependence, unspecified, uncomplicated: Secondary | ICD-10-CM

## 2013-03-12 MED ORDER — LOSARTAN POTASSIUM 50 MG PO TABS
50.0000 mg | ORAL_TABLET | Freq: Every day | ORAL | Status: DC
Start: 2013-03-12 — End: 2013-07-29

## 2013-03-12 NOTE — Patient Instructions (Signed)
Increase losartan to 50mg  daily.   Your physician recommends that you return for lab work in: 2 weeks--BMET.   Your physician has requested that you have an echocardiogram. Echocardiography is a painless test that uses sound waves to create images of your heart. It provides your doctor with information about the size and shape of your heart and how well your heart's chambers and valves are working. This procedure takes approximately one hour. There are no restrictions for this procedure. In 2 MONTHS  Your physician recommends that you schedule a follow-up appointment in: 3 months with Dr Shirlee Latch in the [facility].  The telephone number for information about weight loss/Bariatric Surgery in [phone].

## 2013-03-14 NOTE — Progress Notes (Signed)
Patient ID: Ethan Wilcox, male   DOB: 05/21/1955, 58 y.o.   MRN: [id number] PCP: Dr. Julio Sickssei-Bonsu  58 yo with history of nonischemic cardiomyopathy presents for cardiology followup. [name] had a prolonged admission in 5/11 for CHF. Echo showed EF 15-20% with diffuse hypokinesis. Left and right heart cath showed no angiographic CAD and Fick CI 1.47. Patient became hypotensive with ARF (cardiogenic shock) requiring milrinone and dopamine. He was gradually titrated off these medications and begun on an oral regimen.  He saw Dr. Ladona Ridgelaylor regarding an ICD but actually at that time admitted to resuming ETOH intake. Therefore, no ICD yet.  He is still drinking but tells me that he has cut back: a fifth lasts most of the week. He still smokes 1/2 ppd.  Last echo in 4/14 showed EF 25-30%, diffuse hypokinesis.  Since I last saw him, he went to the hospital with angioedema.  This actually may have been related to eating shellfish but lisinopril was stopped.  His spironolactone was also stopped because he was mildly hyperkalemic at one point.   Since last appointment, weight is up 5 lbs.  He is short of breath after walking 100 yds. He can walk up a flight steps without much problem.  No orthopnea.  No chest pain.  Still follows a very poor diet.    ECG: NSR, anterolateral T wave inversions, PVCs, QTc 500 msec  Labs (5/11): K 4.4, creatinine 1.23, LDL 104, HDL 33  Labs (6/11): BNP 97, K 4.8, creatinine 1.4  Labs (7/11): K 4.8, creatinine 1.0, BNP 53  Labs (10/11): BNP 25, K 4.8 => 4.3, creatinine 1.4 => 1.3  Labs (12/11): BNP 25, K 4.9, creatinine 1.2  Labs (3/12): K 4.3, creatinine 1.2 Labs (11/12): K 4, creatinine 1.3, BNP 82, TGs 518, HDL 39, HDL 60 Labs (2/13): AST 72, ALT 96 Labs (4/13): K 4.4, creatinine 0.95, proBNP 185 Labs (5/13): K 4.3, creatinine 1.2, BNP 32 Labs (7/13): K 4.1, creatinine 1.2, BNP 15 Labs (8/13): K 4.6, creatinine 1.0 Labs (11/13): K 3.9, creatinine 1.1, BNP 32 Labs (4/14): K  4.8, creatinine 1.2, BNP 28 Labs (8/14): K 4.1, creatinine 1.0 Labs (2/15): K 4.1, creatinine 1.09  Allergies (verified):  1) ! Penicillin   Past Medical History:  1. Nonischemic Cardiomyopathy: Admitted 5/11 with acute systolic CHF. LHC showed no angiographic CAD. Echo (5/11) showed EF 15-20% with diffuse severe hypokinesis, moderate MR. RHC showed PCWP 10, CI 1.47 (Fick).  Echo (4/14) with EF 35%, diffuse hypokinesis.  Cause of CMP thought to be heavy ETOH and cocaine. No ICD yet due to ongoing ETOH intake.  2. ETOH abuse 3. Cocaine abuse: Quit in 2011  4. Active smoker: 1/2 ppd  5. Obesity  6. OSA: Severe 7. Angioedema: ? ACEI or shellfish related.    Family History:  "Heart disease" in mother   Social History:  Worked as Gafferhandyman. Smokes 1/2 ppd. 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 Heavy ETOH in the past, now drinking around a fifth a week.    ROS: All systems reviewed and negative except as per HPI.    Current Outpatient Prescriptions  Medication Sig Dispense Refill  . aspirin (ASPIR-LOW) 81 MG EC tablet Take 81 mg by mouth daily.       . carvedilol (COREG) 25 MG tablet Take 1 tablet (25 mg total) by mouth 2 (two) times daily.  60 tablet  11  . EPINEPHrine (EPIPEN) 0.3 mg/0.3 mL SOAJ injection Inject 0.3 mL (0.3 mg total)  into the muscle once as needed (anaphylaxis).  1 Device  1  . furosemide (LASIX) 40 MG tablet 1 and 1/2 tablets (total 60mg ) two times a day  90 tablet  3  . isosorbide-hydrALAZINE (BIDIL) 20-37.5 MG per tablet Take 1 tablet by mouth 3 (three) times daily.      . Multiple Vitamin (MULTIVITAMIN WITH MINERALS) TABS Take 1 tablet by mouth daily.      . naproxen sodium (ALEVE) 220 MG tablet Take 1 tablet (220 mg total) by mouth 2 (two) times daily as needed.  30 tablet  0  . omeprazole (PRILOSEC) 40 MG capsule TAKE ONE CAPSULE BY MOUTH DAILY BEFORE BREAKFAST  90 capsule  1  . spironolactone (ALDACTONE) 25 MG tablet Take 1 tablet (25 mg total) by mouth daily.  30  tablet  3  . losartan (COZAAR) 50 MG tablet Take 1 tablet (50 mg total) by mouth daily.  30 tablet  4   No current facility-administered medications for this visit.    BP 124/95  Pulse 95  Ht 5\' 7"  (1.702 m)  Wt 122.38 kg (269 lb 12.8 oz)  BMI 42.25 kg/m2 General: NAD, obese Neck: Thick, JVP difficult but does not appear particularly elevated today, no thyromegaly or thyroid nodule.  Lungs: Decreased breath sounds at bases.  CV: Nondisplaced PMI.  Heart regular S1/S2, no S3/S4, no murmur.  No peripheral edema.  No carotid bruit.  Normal pedal pulses.  Abdomen: Soft, nontender, no hepatosplenomegaly, no distention.  Neurologic: Alert and oriented x 3.  Psych: Normal affect. Extremities: No clubbing or cyanosis.   Assessment/Plan:  CHRONIC SYSTOLIC HEART FAILURE  Nonischemic cardiomyopathy, possibly ETOH-related.  Echo in 4/14 with EF 25-30%. Stable NYHA class III symptoms.  He is still drinking but has cut back considerably. - Continue current doses of Coreg, spironolactone, and Bidil. He has not tolerated uptitration of Bidil due to headache.  - Increase losartan to 50 mg daily.  I am not sure that angioedema was due to ACEI to begin with and even if it was, having angioedema with ARB when one has had angioedema with ACEI is very rare.  - Continue Lasix at 60 bid.   - BMET in 2 weeks.   - Not candidate for ICD until he can quit drinking => I reiterated this today. He has been cutting back but still drinking hard liquor.  OSA  Wearing CPAP, doing better. TOBACCO ABUSE I counselled him again to quit smoking.  Obesity Ongoing battle.  BMI is 42 now.  He has a lot of trouble following a healthy diet, some of the problem may be due to finances.  He is interested in attending the bariatric program information session. I will give him the number.    [name] 03/14/2013

## 2013-03-17 ENCOUNTER — Telehealth: Payer: Self-pay | Admitting: Licensed Clinical Social Worker

## 2013-03-17 DIAGNOSIS — F17200 Nicotine dependence, unspecified, uncomplicated: Secondary | ICD-10-CM

## 2013-03-17 MED ORDER — NICOTINE 21 MG/24HR TD PT24
21.0000 mg | MEDICATED_PATCH | TRANSDERMAL | Status: DC
Start: 2013-03-17 — End: 2013-07-29

## 2013-03-17 NOTE — Telephone Encounter (Signed)
CSW received voicemail from K. Lamar Benes, LPN [id number] care manager.  Pt is interested in smoking cessation and has tried oral medication in the past without success.  Pt would like to try nicotine patches.  CSW placed call to Mr. Notte to confirm desire.  CSW placed called to pt.  CSW left message requesting return call. CSW provided contact hours and phone number.

## 2013-03-17 NOTE — Telephone Encounter (Signed)
I have called in Nicotine patches for the patient. I tried to call him on the listed phone number but was unable to speak with him. Please try to contact him if you can. Patches sent to his pharmacy. Thanks

## 2013-03-26 ENCOUNTER — Other Ambulatory Visit (INDEPENDENT_AMBULATORY_CARE_PROVIDER_SITE_OTHER): Payer: Medicaid Other

## 2013-03-26 DIAGNOSIS — I5022 Chronic systolic (congestive) heart failure: Secondary | ICD-10-CM

## 2013-03-26 LAB — BASIC METABOLIC PANEL
BUN: 11 mg/dL (ref 6–23)
CHLORIDE: 100 meq/L (ref 96–112)
CO2: 30 mEq/L (ref 19–32)
Calcium: 8.8 mg/dL (ref 8.4–10.5)
Creatinine, Ser: 1.1 mg/dL (ref 0.4–1.5)
GFR: 88.94 mL/min (ref >60.00)
Glucose, Bld: 167 mg/dL — ABNORMAL HIGH (ref 70–99)
POTASSIUM: 3.8 meq/L (ref 3.5–5.1)
Sodium: 137 mEq/L (ref 135–145)

## 2013-03-27 ENCOUNTER — Other Ambulatory Visit: Payer: Self-pay | Admitting: Cardiology

## 2013-05-12 ENCOUNTER — Other Ambulatory Visit (HOSPITAL_COMMUNITY): Payer: Medicaid Other

## 2013-05-13 ENCOUNTER — Encounter (HOSPITAL_COMMUNITY): Payer: Self-pay | Admitting: Cardiology

## 2013-06-04 ENCOUNTER — Ambulatory Visit (HOSPITAL_COMMUNITY): Payer: Medicaid Other | Attending: Cardiovascular Disease | Admitting: Cardiology

## 2013-06-04 DIAGNOSIS — I509 Heart failure, unspecified: Secondary | ICD-10-CM | POA: Insufficient documentation

## 2013-06-04 DIAGNOSIS — I5022 Chronic systolic (congestive) heart failure: Secondary | ICD-10-CM

## 2013-06-04 NOTE — Progress Notes (Signed)
Echo performed. 

## 2013-06-16 ENCOUNTER — Other Ambulatory Visit: Payer: Self-pay | Admitting: Cardiology

## 2013-06-19 ENCOUNTER — Ambulatory Visit: Payer: Medicaid Other | Admitting: Cardiology

## 2013-06-26 ENCOUNTER — Other Ambulatory Visit: Payer: Self-pay | Admitting: Cardiology

## 2013-06-27 ENCOUNTER — Other Ambulatory Visit: Payer: Self-pay | Admitting: Cardiology

## 2013-07-09 ENCOUNTER — Ambulatory Visit: Payer: Medicaid Other | Admitting: Internal Medicine

## 2013-07-29 ENCOUNTER — Ambulatory Visit (INDEPENDENT_AMBULATORY_CARE_PROVIDER_SITE_OTHER): Payer: Medicaid Other | Admitting: Cardiology

## 2013-07-29 ENCOUNTER — Encounter: Payer: Self-pay | Admitting: Cardiology

## 2013-07-29 VITALS — BP 120/67 | HR 50 | Ht 67.0 in | Wt 272.0 lb

## 2013-07-29 DIAGNOSIS — F1010 Alcohol abuse, uncomplicated: Secondary | ICD-10-CM

## 2013-07-29 DIAGNOSIS — F17200 Nicotine dependence, unspecified, uncomplicated: Secondary | ICD-10-CM

## 2013-07-29 DIAGNOSIS — I5022 Chronic systolic (congestive) heart failure: Secondary | ICD-10-CM

## 2013-07-29 LAB — BASIC METABOLIC PANEL
BUN: 15 mg/dL (ref 6–23)
CO2: 27 meq/L (ref 19–32)
Calcium: 9.9 mg/dL (ref 8.4–10.5)
Chloride: 101 mEq/L (ref 96–112)
Creatinine, Ser: 1.1 mg/dL (ref 0.4–1.5)
GFR: 88.83 mL/min (ref >60.00)
Glucose, Bld: 123 mg/dL — ABNORMAL HIGH (ref 70–99)
POTASSIUM: 4 meq/L (ref 3.5–5.1)
SODIUM: 138 meq/L (ref 135–145)

## 2013-07-29 MED ORDER — LOSARTAN POTASSIUM 50 MG PO TABS: ORAL_TABLET | ORAL | Status: DC

## 2013-07-29 MED ORDER — NICOTINE 14 MG/24HR TD PT24: 14.0000 mg | MEDICATED_PATCH | Freq: Every day | TRANSDERMAL | Status: DC

## 2013-07-29 NOTE — Patient Instructions (Signed)
Increase losartan to 75mg  daily. This will be 1 and 1/2 of a 50mg  tablet daily.  Your physician recommends that you have  lab work today--BMET.  Your physician recommends that you return for lab work in: 2 weeks--BMET/BNP.  Your physician recommends that you schedule a follow-up appointment in: 3 months in the [facility].   I have given you a prescription for nicotine patches to use to help you stop smoking.

## 2013-07-30 ENCOUNTER — Ambulatory Visit (INDEPENDENT_AMBULATORY_CARE_PROVIDER_SITE_OTHER): Payer: Medicaid Other | Admitting: Internal Medicine

## 2013-07-30 ENCOUNTER — Encounter: Payer: Self-pay | Admitting: Internal Medicine

## 2013-07-30 VITALS — BP 95/66 | HR 71 | Temp 97.6°F | Ht 67.0 in | Wt 276.0 lb

## 2013-07-30 DIAGNOSIS — M79671 Pain in right foot: Secondary | ICD-10-CM

## 2013-07-30 DIAGNOSIS — K5900 Constipation, unspecified: Secondary | ICD-10-CM | POA: Insufficient documentation

## 2013-07-30 DIAGNOSIS — F1010 Alcohol abuse, uncomplicated: Secondary | ICD-10-CM

## 2013-07-30 DIAGNOSIS — I5022 Chronic systolic (congestive) heart failure: Secondary | ICD-10-CM

## 2013-07-30 DIAGNOSIS — Z299 Encounter for prophylactic measures, unspecified: Secondary | ICD-10-CM

## 2013-07-30 DIAGNOSIS — M79609 Pain in unspecified limb: Secondary | ICD-10-CM

## 2013-07-30 DIAGNOSIS — G4733 Obstructive sleep apnea (adult) (pediatric): Secondary | ICD-10-CM

## 2013-07-30 DIAGNOSIS — I10 Essential (primary) hypertension: Secondary | ICD-10-CM

## 2013-07-30 MED ORDER — DOCUSATE SODIUM 50 MG PO CAPS: 50.0000 mg | ORAL_CAPSULE | Freq: Two times a day (BID) | ORAL | Status: DC

## 2013-07-30 MED ORDER — DOCUSATE SODIUM 50 MG PO CAPS
50.0000 mg | ORAL_CAPSULE | Freq: Two times a day (BID) | ORAL | Status: DC | PRN
Start: 2013-07-30 — End: 2013-10-22

## 2013-07-30 NOTE — Patient Instructions (Signed)
Please take your medication as before  Please keep taking Losartan 50 mg daily instead of 75mg  daily due to low blood pressure  I will refer you to a foot doctor I have started you on a stool softener Please see Donar today for weight loss Please use you CPAP Machine

## 2013-07-30 NOTE — Progress Notes (Signed)
Patient ID: Ethan Wilcox, male   DOB: 03-10-1955, 58 y.o.   MRN: [id number] PCP: Dr. Zada Wilcox  58 yo with history of nonischemic cardiomyopathy presents for cardiology followup. Ethan Wilcox had a prolonged admission in 5/11 for CHF. Echo showed EF 15-20% with diffuse hypokinesis. Left and right heart cath showed no angiographic CAD and Fick CI 1.47. Patient became hypotensive with ARF (cardiogenic shock) requiring milrinone and dopamine. He was gradually titrated off these medications and begun on an oral regimen.  He saw Dr. Ladona Wilcox regarding an ICD but actually at that time admitted to resuming ETOH intake. Therefore, no ICD yet.  He is still drinking 1/2 pint liquor on most days. He still smokes 1/2 ppd.  Last echo in 5/15 showed EF 30-35%, diffuse hypokinesis.  He has had a hospitalization with angioedema.  This actually may have been related to eating shellfish but lisinopril was stopped.    Since last appointment, weight is up 3 lbs.  He is short of breath after walking 50 yds. He can walk up a flight steps without much problem.  No orthopnea.  No chest pain.  Still follows a very poor diet.  Looked into bariatric surgery but his insurance will not cover. He is not using his CPAP regularly.   He has noted some blood coating stool, has history of hemorrhoids.   Labs (5/11): K 4.4, creatinine 1.23, LDL 104, HDL 33  Labs (6/11): BNP 97, K 4.8, creatinine 1.4  Labs (7/11): K 4.8, creatinine 1.0, BNP 53  Labs (10/11): BNP 25, K 4.8 => 4.3, creatinine 1.4 => 1.3  Labs (12/11): BNP 25, K 4.9, creatinine 1.2  Labs (3/12): K 4.3, creatinine 1.2 Labs (11/12): K 4, creatinine 1.3, BNP 82, TGs 518, HDL 39, HDL 60 Labs (2/13): AST 72, ALT 96 Labs (4/13): K 4.4, creatinine 0.95, proBNP 185 Labs (5/13): K 4.3, creatinine 1.2, BNP 32 Labs (7/13): K 4.1, creatinine 1.2, BNP 15 Labs (8/13): K 4.6, creatinine 1.0 Labs (11/13): K 3.9, creatinine 1.1, BNP 32 Labs (4/14): K 4.8, creatinine 1.2, BNP 28 Labs  (8/14): K 4.1, creatinine 1.0 Labs (2/15): K 4.1, creatinine 1.09 Labs (3/15): K 3.8, creatinine 1.1  Allergies (verified):  1) ! Penicillin   Past Medical History:  1. Nonischemic Cardiomyopathy: Admitted 5/11 with acute systolic CHF. LHC showed no angiographic CAD. Echo (5/11) showed EF 15-20% with diffuse severe hypokinesis, moderate MR. RHC showed PCWP 10, CI 1.47 (Fick).  Echo (4/14) with EF 35%, diffuse hypokinesis.  Echo (5/15) with EF 30-35%, mild LV dilation, mild MR.  Cause of CMP thought to be heavy ETOH and cocaine. No ICD yet due to ongoing ETOH intake.  2. ETOH abuse 3. Cocaine abuse: Quit in 2011  4. Active smoker: 1/2 ppd  5. Obesity  6. OSA: Severe 7. Angioedema: ? ACEI or shellfish related.    Family History:  "Heart disease" in mother   Social History:  Worked as Gaffer. Smokes 1/2 ppd. 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 Heavy ETOH in the past, now drinking around 1/2 pint liquor daily.    ROS: All systems reviewed and negative except as per HPI.    Current Outpatient Prescriptions  Medication Sig Dispense Refill  . aspirin (ASPIR-LOW) 81 MG EC tablet Take 81 mg by mouth daily.       . carvedilol (COREG) 25 MG tablet Take 1 tablet (25 mg total) by mouth 2 (two) times daily.  60 tablet  11  . EPINEPHrine (EPIPEN) 0.3 mg/0.3  mL SOAJ injection Inject 0.3 mL (0.3 mg total) into the muscle once as needed (anaphylaxis).  1 Device  1  . furosemide (LASIX) 40 MG tablet TAKE 1& 1/2 TABLETS BY MOUTH TWICE DAILY  270 tablet  0  . isosorbide-hydrALAZINE (BIDIL) 20-37.5 MG per tablet Take 1 tablet by mouth 3 (three) times daily.      . isosorbide-hydrALAZINE (BIDIL) 20-37.5 MG per tablet Take 1 tablet by mouth 3 (three) times daily.  90 tablet  1  . Multiple Vitamin (MULTIVITAMIN WITH MINERALS) TABS Take 1 tablet by mouth daily.      . naproxen sodium (ALEVE) 220 MG tablet Take 1 tablet (220 mg total) by mouth 2 (two) times daily as needed.  30 tablet  0  . omeprazole  (PRILOSEC) 40 MG capsule TAKE ONE CAPSULE BY MOUTH DAILY BEFORE BREAKFAST  90 capsule  1  . spironolactone (ALDACTONE) 25 MG tablet TAKE 1 TABLET BY MOUTH EVERY DAY  90 tablet  0  . losartan (COZAAR) 50 MG tablet 1 and 1/2 tablets (75mg ) daily  45 tablet  3  . nicotine (NICODERM CQ) 14 mg/24hr patch Place 1 patch (14 mg total) onto the skin daily.  28 patch  3   No current facility-administered medications for this visit.    BP 120/67  Pulse 50  Ht 5\' 7"  (1.702 m)  Wt 272 lb (123.378 kg)  BMI 42.59 kg/m2 General: NAD, obese Neck: Thick, JVP difficult but does not appear particularly elevated today, no thyromegaly or thyroid nodule.  Lungs: Decreased breath sounds at bases.  CV: Nondisplaced PMI.  Heart regular S1/S2, no S3/S4, no murmur.  No peripheral edema.  No carotid bruit.  Normal pedal pulses.  Abdomen: Soft, nontender, no hepatosplenomegaly, no distention.  Neurologic: Alert and oriented x 3.  Psych: Normal affect. Extremities: No clubbing or cyanosis.   Assessment/Plan:  CHRONIC SYSTOLIC HEART FAILURE  Nonischemic cardiomyopathy, possibly ETOH-related.  Echo in 5/15 with EF 30-35%. Stable NYHA class III symptoms.  He is still drinking. - Continue current doses of Coreg, spironolactone, and Bidil. He has not tolerated uptitration of Bidil due to headache.  - Increase losartan to 75 mg daily.  I am not sure that angioedema was due to ACEI to begin with and even if it was, having angioedema with ARB when one has had angioedema with ACEI is very rare.  - Continue Lasix at 60 bid.   - BMET today and in 2 wks.   - Not candidate for ICD until he can quit drinking => I reiterated this today. He is still drinking hard liquor.  OSA  I encouraged him to be more regular with CPAP use. TOBACCO ABUSE I counselled him again to quit smoking. I recommended that he try nicotine patches.  Obesity Ongoing battle.  BMI is 42 now.  He has a lot of trouble following a healthy diet, some of the  problem may be due to finances.  He was interested in bariatric surgery, but insurance will not cover apparently.    Ethan Wilcox 07/30/2013

## 2013-07-31 DIAGNOSIS — M79671 Pain in right foot: Secondary | ICD-10-CM | POA: Insufficient documentation

## 2013-07-31 NOTE — Assessment & Plan Note (Signed)
Referred to [facility] for education weight loss.

## 2013-07-31 NOTE — Assessment & Plan Note (Signed)
BP Readings from Last 3 Encounters:  07/30/13 95/66  07/29/13 120/67  03/12/13 124/95    Lab Results  Component Value Date   NA 138 07/29/2013   K 4.0 07/29/2013   CREATININE 1.1 07/29/2013    Assessment: Blood pressure control: controlled Progress toward BP goal:  unchanged Comments: low BP today with 2 readings  Plan: Medications:  Losartan 50 mg daily, Coreg 25 mg daily, Bidil 20-37.5 mg tid, Aldactone 25 mg daily, and furosemide 40 mg daily. Educational resources provided:   Pensions consultant provided:   Other plans:  See Vira Blanco for weight loss will f/u in 3 months but sooner if needed

## 2013-07-31 NOTE — Assessment & Plan Note (Signed)
Advised him to cont with his CPAP machine use

## 2013-07-31 NOTE — Progress Notes (Signed)
Patient ID: Ethan Wilcox, male   DOB: 02-06-1955, 58 y.o.   MRN: [id number]   Subjective:   HPI: [name] is a 58 y.o. gentleman, with past medical history of congestive heart failure, secondary to nonischemic cardiomyopathy, hypertension, obesity, obstructive sleep apnea, excessive alcohol use and cigarette smoking, presents for a routine follow up visit.  He is complaining of right great toe pain, which has been present for several months. Describes his pain as moderate, mostly increased at nighttime. Sometimes he is unable to sleep. No history of trauma prior to onset of the pain. He has no history of gout. He denies constitutional symptoms.  Besides that, patient reports to be feeling otherwise well. He was seen by his cardiologist, Dr. Shirlee Latch yesterday. He was instructed to increase his losartan from 50 mg to 75 mg daily. However, he has not yet made this change. His blood pressure in the clinic is soft 86/57 and 95/66 on repeat. Patient is completely asymptomatic.  He is interested in weight loss, and he asks for my advice.   ROS: Constitutional: Denies fever, chills, diaphoresis, appetite change and fatigue.  Respiratory: Denies SOB, DOE, cough, chest tightness, and wheezing. Denies chest pain. CVS: No chest pain, palpitations and leg swelling. No dizziness on standing. GI: No abdominal pain, nausea, vomiting, bloody stools. He endorses constipation, and is asking for a stool softener.  GU: No dysuria, frequency, hematuria, or flank pain.  MSK: No myalgias, back pain, joint swelling, arthralgias  Psych: No depression symptoms. No SI or SA. Poor sleep wit noncompliance with a CPAP machine    Past Medical History  Diagnosis Date  . Nonischemic cardiomyopathy     admitted 5/11 w acute systolic CHF. LHC showed no angiographic CAD. Echo (5/11) showed EF 15-20% w diffuse sever hypokinesis, moderate MR> RHC showed PCWP 10, CI 1.47 (Fick.) cause of CMP thought to be heavy ETOH  and cocaine. No ICD yet due to ongoing ETOH intake  . ETOH abuse   . Cocaine abuse   . Active smoker /    1/2 ppd  . Obesity   . Hypertension   . CHF (congestive heart failure)   . Gout   . GERD (gastroesophageal reflux disease)   . Sleep apnea   . 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 aspirin (ASPIR-LOW) 81 MG EC tablet Take 81 mg by mouth daily.       . carvedilol (COREG) 25 MG tablet Take 1 tablet (25 mg total) by mouth 2 (two) times daily.  60 tablet  11  . EPINEPHrine (EPIPEN) 0.3 mg/0.3 mL SOAJ injection Inject 0.3 mL (0.3 mg total) into the muscle once as needed (anaphylaxis).  1 Device  1  . furosemide (LASIX) 40 MG tablet TAKE 1& 1/2 TABLETS BY MOUTH TWICE DAILY  270 tablet  0  . isosorbide-hydrALAZINE (BIDIL) 20-37.5 MG per tablet Take 1 tablet by mouth 3 (three) times daily.  90 tablet  1  . losartan (COZAAR) 50 MG tablet 1 and 1/2 tablets (75mg ) daily  45 tablet  3  . Multiple Vitamin (MULTIVITAMIN WITH MINERALS) TABS Take 1 tablet by mouth daily.      . naproxen sodium (ALEVE) 220 MG tablet Take 1 tablet (220 mg total) by mouth 2 (two) times daily as needed.  30 tablet  0  . nicotine (NICODERM CQ) 14 mg/24hr patch Place 1 patch (14 mg total) onto the skin daily.  28 patch  3  . omeprazole (PRILOSEC) 40 MG capsule TAKE ONE CAPSULE BY MOUTH DAILY BEFORE BREAKFAST  90 capsule  1  . spironolactone (ALDACTONE) 25 MG tablet TAKE 1 TABLET BY MOUTH EVERY DAY  90 tablet  0  . docusate sodium (COLACE) 50 MG capsule Take 1 capsule (50 mg total) by mouth 2 (two) times daily as needed for mild constipation.  30 capsule  3   No current facility-administered medications for this visit.   Family History  Problem Relation Age of Onset  . Heart disease Mother   . Cancer Mother   . Heart disease  Father    History   Social History  . Marital Status: Single    Spouse Name: N/A    Number of Children: N/A  . Years of Education: N/A   Occupational History  . DISABLED    Social History Main Topics  . Smoking status: Current Every Day Smoker -- 0.50 packs/day for 38 years    Types: Cigarettes  . Smokeless tobacco: Not on file     Comment: was on patches.  Was stopped.  Restarted smoking/ 1/2 PPD  . Alcohol Use: 7.2 oz/week    12 Shots of liquor per week     Comment: Vodka 1/2 pint 3 days a week  . Drug Use: Yes    Special: Cocaine     Comment: former  . Sexual Activity: Not on file   Other Topics Concern  . Not on file   Social History Narrative   Works as Gafferhandyman. 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 Heavy ETOH in past, now back to drinking 1/2 pint/week.          No show 07/16/09    Objective:  Physical Exam: Filed Vitals:   07/30/13 0949 07/30/13 1000  BP: 86/57 95/66  Pulse: 87 71  Temp: 97.6 F (36.4 C)   TempSrc: Oral   Height: 5\' 7"  (1.702 m)   Weight: 276 lb (125.193 kg)   SpO2: 98%    General: Well nourished. No acute distress. Obese HEENT: Normal oral mucosa. MMM.  Lungs: CTA bilaterally. Heart: RRR; no extra sounds or murmurs, no JVD  Abdomen: Non-distended, normal BS, soft, nontender; no hepatosplenomegaly  Extremities: No pedal edema. No joint swelling or tenderness.  Right great toe: tenderness on palpation involving the cuticle and proximal nail fold. The nail looks deformed and possibly with fungal infection. No signs of inflammation or infection. He has similar changes in most of his other toenails but without tenderness.  Neurologic: Normal EOM,  Alert and oriented x3. No obvious neurologic/cranial nerve deficits.  Assessment & Plan:  I have discussed my assessment and plan  with  my attending in the clinic, Dr. Heide SparkNarendra as detailed under problem based charting.

## 2013-07-31 NOTE — Assessment & Plan Note (Signed)
Assessment: Most likely diagnosis: Onychomycosis. No evidence of infection. Ddx: I do not suspect gout   Plan: 1. Labs/imaging: none 2. Therapy: Pain relief with Tylenol recommended 3. Follow up: Referral to podiatry

## 2013-07-31 NOTE — Assessment & Plan Note (Signed)
Trial of Colace.

## 2013-07-31 NOTE — Assessment & Plan Note (Signed)
No signs of fluid overload. Seen by Dr. Shirlee Latch of cardiology on 07/29/2013, who recommended to increase his losartan from 50 mg 75 mg daily. He reports that he had not yet tried the increased dose of 75mg . Patient's blood pressure is low today. He is asymptomatic. Plan. - Advice him to keep his losartan at 50 mg daily due to low BP - counseled the patient about symptoms of low BP - I will send a message to Dr Shirlee Latch

## 2013-07-31 NOTE — Assessment & Plan Note (Signed)
Will provide him with home FOBT cards.

## 2013-07-31 NOTE — Progress Notes (Signed)
INTERNAL MEDICINE TEACHING ATTENDING ADDENDUM - Angelis Gates, MD: I reviewed and discussed at the time of visit with the resident Dr. Kazibwe, the patient's medical history, physical examination, diagnosis and results of pertinent tests and treatment and I agree with the patient's care as documented.  

## 2013-08-12 ENCOUNTER — Other Ambulatory Visit: Payer: Medicaid Other

## 2013-08-19 ENCOUNTER — Other Ambulatory Visit: Payer: Medicaid Other

## 2013-08-21 ENCOUNTER — Ambulatory Visit: Payer: Self-pay | Admitting: Podiatry

## 2013-08-26 ENCOUNTER — Other Ambulatory Visit: Payer: Medicaid Other

## 2013-08-27 ENCOUNTER — Ambulatory Visit (INDEPENDENT_AMBULATORY_CARE_PROVIDER_SITE_OTHER): Payer: Medicaid Other | Admitting: Dietician

## 2013-08-27 ENCOUNTER — Encounter: Payer: Self-pay | Admitting: Dietician

## 2013-08-27 ENCOUNTER — Other Ambulatory Visit (INDEPENDENT_AMBULATORY_CARE_PROVIDER_SITE_OTHER): Payer: Medicaid Other

## 2013-08-27 DIAGNOSIS — I5022 Chronic systolic (congestive) heart failure: Secondary | ICD-10-CM

## 2013-08-27 LAB — BASIC METABOLIC PANEL
BUN: 12 mg/dL (ref 6–23)
CHLORIDE: 100 meq/L (ref 96–112)
CO2: 28 meq/L (ref 19–32)
CREATININE: 1.1 mg/dL (ref 0.4–1.5)
Calcium: 9.1 mg/dL (ref 8.4–10.5)
GFR: 92.68 mL/min (ref >60.00)
Glucose, Bld: 161 mg/dL — ABNORMAL HIGH (ref 70–99)
Potassium: 3.5 mEq/L (ref 3.5–5.1)
SODIUM: 137 meq/L (ref 135–145)

## 2013-08-27 LAB — BRAIN NATRIURETIC PEPTIDE: PRO B NATRI PEPTIDE: 29 pg/mL (ref 0.0–100.0)

## 2013-08-27 NOTE — Patient Instructions (Signed)
Please stop up front and make an appointment for 3 weeks.  Walk at least 30 minutes every day. Can walk 15 minutes in the morning and 15 minutes in the evening.  Bake, grill, boil or broil meats- chicken and fish are great!!!   Use regular potatoes more often and eating the skin is is good FIBER.   Use tub margarine instead of stick.  Drink diet ginger ale or any diet clear soda, diet green tea, sprite zero- water and tea made from tea bags are great too!!! Just try not to use much sugar or use artifical

## 2013-08-27 NOTE — Progress Notes (Signed)
  Medical Nutrition Therapy:  Appt start time: 0935 end time:  0950.  Assessment:  Primary concerns today: weight loss- says he doesn't move as well being big.  Wants to be about 200#.   patient did not know about his appointment today but is agreeable to ideas for weight loss.  Preferred Learning Style: Auditory  Learning Readiness: Contemplating MEDICATIONS: noted. Labs: triglycerides 518 om 2012- last time they were checked   DIETARY INTAKE: Usual eating pattern includes 3 meals and 3 snacks per day. Everyday foods include meat and soda.  Avoided foods include dairy.   24-hr recall:  B ( AM): soda- 3 liters a day, meat- fried chicken or fish- eats about 12 pieces of meat/day Snk ( AM): soda L ( PM): meat, mashed instant potatoes and gravy, soda, vegetables on occassion Snk ( PM): soda, fruit cocktail D ( PM): meat, mashed instant potatoes and gravy, soda Snk ( PM): soda Beverages: water and regular ginger ale  Usual physical activity: works around house. refinishes furniture,  stopped using power wheelchair and feels better since stopping. BMI 46- class 3 obesity Estimated energy needs:   2,449 Calories/day to maintain  weight.    1,949 Calories/day to lose 1 lb per week.    1,449 Calories/day to lose 2 lb per week.calories 200-220 g carbohydrates 110-120 g protein 70-80 g fat  Progress Towards Goal(s):  In progress.   Nutritional Diagnosis:  NB-1.1 Food and nutrition-related knowledge deficit As related to lack of prior exposure to nutrition information or meal planning fo weight loss.  As evidenced by his report and questions about weight loss.    Intervention:  Nutrition education about weight loss, meal planning for improved nutrition and decreased calories for weight loss as well as encouragement for increased activity. Teaching Method Utilized: Visual,  Auditory and Hands on  Handouts given during visit include:AVS Barriers to learning/adherence to lifestyle change:  finances and support Demonstrated degree of understanding via:  Teach Back   Monitoring/Evaluation:  Dietary intake, exercise, and body weight in 3 week(s).

## 2013-08-28 ENCOUNTER — Ambulatory Visit: Payer: Self-pay

## 2013-08-28 ENCOUNTER — Encounter: Payer: Self-pay | Admitting: Podiatry

## 2013-08-28 DIAGNOSIS — R52 Pain, unspecified: Secondary | ICD-10-CM

## 2013-08-28 NOTE — Addendum Note (Signed)
Addended by: Neomia Dear on: 08/28/2013 08:47 PM   Modules accepted: Orders

## 2013-08-29 NOTE — Progress Notes (Signed)
This encounter was created in error - please disregard.

## 2013-09-01 ENCOUNTER — Telehealth: Payer: Self-pay | Admitting: *Deleted

## 2013-09-01 NOTE — Telephone Encounter (Signed)
Received PA request from pt's pharmacy for losartan.  Medicaid requires trial and failure of an ACE.  Per pt's chart, Lisinopril  was possible cause of anaphylaxis episode in Aug 2014 requiring hospitalization. Request submitted online via  Tracks, request sent for review.Ethan Spittle Cassady8/24/201510:22 AM    Confirmation #:   [id number] W

## 2013-09-17 ENCOUNTER — Encounter: Payer: Medicaid Other | Admitting: Dietician

## 2013-09-23 ENCOUNTER — Encounter (HOSPITAL_COMMUNITY): Payer: Self-pay

## 2013-09-23 ENCOUNTER — Ambulatory Visit (HOSPITAL_COMMUNITY)
Admission: RE | Admit: 2013-09-23 | Discharge: 2013-09-23 | Disposition: A | Discharge status: Home / Self Care | Payer: Medicaid Other | Source: Ambulatory Visit | Attending: Cardiology | Admitting: Cardiology

## 2013-09-23 VITALS — BP 122/88 | HR 90 | Wt 277.1 lb

## 2013-09-23 DIAGNOSIS — F1020 Alcohol dependence, uncomplicated: Secondary | ICD-10-CM | POA: Diagnosis not present

## 2013-09-23 DIAGNOSIS — Z79899 Other long term (current) drug therapy: Secondary | ICD-10-CM | POA: Diagnosis not present

## 2013-09-23 DIAGNOSIS — Z880 Allergy status to penicillin: Secondary | ICD-10-CM | POA: Insufficient documentation

## 2013-09-23 DIAGNOSIS — Z8249 Family history of ischemic heart disease and other diseases of the circulatory system: Secondary | ICD-10-CM | POA: Diagnosis not present

## 2013-09-23 DIAGNOSIS — Z7982 Long term (current) use of aspirin: Secondary | ICD-10-CM | POA: Insufficient documentation

## 2013-09-23 DIAGNOSIS — I428 Other cardiomyopathies: Secondary | ICD-10-CM | POA: Diagnosis not present

## 2013-09-23 DIAGNOSIS — G4733 Obstructive sleep apnea (adult) (pediatric): Secondary | ICD-10-CM | POA: Insufficient documentation

## 2013-09-23 DIAGNOSIS — F17200 Nicotine dependence, unspecified, uncomplicated: Secondary | ICD-10-CM

## 2013-09-23 DIAGNOSIS — E669 Obesity, unspecified: Secondary | ICD-10-CM | POA: Insufficient documentation

## 2013-09-23 DIAGNOSIS — T783XXA Angioneurotic edema, initial encounter: Secondary | ICD-10-CM | POA: Diagnosis not present

## 2013-09-23 DIAGNOSIS — Z9119 Patient's noncompliance with other medical treatment and regimen: Secondary | ICD-10-CM | POA: Diagnosis not present

## 2013-09-23 DIAGNOSIS — F1010 Alcohol abuse, uncomplicated: Secondary | ICD-10-CM | POA: Diagnosis not present

## 2013-09-23 DIAGNOSIS — F1411 Cocaine abuse, in remission: Secondary | ICD-10-CM | POA: Diagnosis not present

## 2013-09-23 DIAGNOSIS — I5022 Chronic systolic (congestive) heart failure: Secondary | ICD-10-CM | POA: Diagnosis not present

## 2013-09-23 DIAGNOSIS — I509 Heart failure, unspecified: Secondary | ICD-10-CM | POA: Insufficient documentation

## 2013-09-23 DIAGNOSIS — Z91199 Patient's noncompliance with other medical treatment and regimen due to unspecified reason: Secondary | ICD-10-CM | POA: Insufficient documentation

## 2013-09-23 DIAGNOSIS — X58XXXA Exposure to other specified factors, initial encounter: Secondary | ICD-10-CM | POA: Diagnosis not present

## 2013-09-23 DIAGNOSIS — Z713 Dietary counseling and surveillance: Secondary | ICD-10-CM | POA: Diagnosis not present

## 2013-09-23 NOTE — Patient Instructions (Signed)
Increase Losartan to 75 mg (1 & 1/2 tabs) daily  Labs in 2 weeks (bmet, liver)  Your physician recommends that you schedule a follow-up appointment in: 4 months

## 2013-09-24 NOTE — Progress Notes (Signed)
Patient ID: Ethan Wilcox, male   DOB: 02/16/1955, 58 y.o.   MRN: [id number] PCP: Dr. Zada Girt  58 yo with history of nonischemic cardiomyopathy presents for cardiology followup. [name] had a prolonged admission in 5/11 for CHF. Echo showed EF 15-20% with diffuse hypokinesis. Left and right heart cath showed no angiographic CAD and Fick CI 1.47. Patient became hypotensive with ARF (cardiogenic shock) requiring milrinone and dopamine. He was gradually titrated off these medications and begun on an oral regimen.  He saw Dr. Ladona Ridgel regarding an ICD but actually at that time admitted to resuming ETOH intake. Therefore, no ICD yet.  He is still drinking 1/2 pint liquor on most days. He still smokes 1/2 ppd.  Last echo in 5/15 showed EF 30-35%, diffuse hypokinesis.  He has had a hospitalization with angioedema.  This actually may have been related to eating shellfish but lisinopril was stopped.    Since last appointment, weight is up 5 lbs.  He does not have dyspnea walking on flat ground though he is short of breath with a hill. He can walk up a flight steps without much problem.  No orthopnea.  No chest pain.  Still follows a very poor diet.  Looked into bariatric surgery but his insurance will not cover. He is not using his CPAP regularly.  At last appointment, I had asked him to increase losartan to 75 mg daily.  He is still on 50 mg daily.  BP is stable.  He is still drinking ETOH though trying to cut back.  His wife has liver cancer and is also still drinking.   Labs (5/11): K 4.4, creatinine 1.23, LDL 104, HDL 33  Labs (6/11): BNP 97, K 4.8, creatinine 1.4  Labs (7/11): K 4.8, creatinine 1.0, BNP 53  Labs (10/11): BNP 25, K 4.8 => 4.3, creatinine 1.4 => 1.3  Labs (12/11): BNP 25, K 4.9, creatinine 1.2  Labs (3/12): K 4.3, creatinine 1.2 Labs (11/12): K 4, creatinine 1.3, BNP 82, TGs 518, HDL 39, HDL 60 Labs (2/13): AST 72, ALT 96 Labs (4/13): K 4.4, creatinine 0.95, proBNP 185 Labs (5/13): K 4.3,  creatinine 1.2, BNP 32 Labs (7/13): K 4.1, creatinine 1.2, BNP 15 Labs (8/13): K 4.6, creatinine 1.0 Labs (11/13): K 3.9, creatinine 1.1, BNP 32 Labs (4/14): K 4.8, creatinine 1.2, BNP 28 Labs (8/14): K 4.1, creatinine 1.0 Labs (2/15): K 4.1, creatinine 1.09 Labs (3/15): K 3.8, creatinine 1.1 Labs (8/15): K 3.5, creatinine 1.1  Allergies (verified):  1) ! Penicillin   Past Medical History:  1. Nonischemic Cardiomyopathy: Admitted 5/11 with acute systolic CHF. LHC showed no angiographic CAD. Echo (5/11) showed EF 15-20% with diffuse severe hypokinesis, moderate MR. RHC showed PCWP 10, CI 1.47 (Fick).  Echo (4/14) with EF 35%, diffuse hypokinesis.  Echo (5/15) with EF 30-35%, mild LV dilation, mild MR.  Cause of CMP thought to be heavy ETOH and cocaine. No ICD yet due to ongoing ETOH intake.  2. ETOH abuse 3. Cocaine abuse: Quit in 2011  4. Active smoker: 1/2 ppd  5. Obesity  6. OSA: Severe 7. Angioedema: ? ACEI or shellfish related.    Family History:  "Heart disease" in mother   Social History:  Worked as [employer]. Smokes 1/2 ppd. 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 Heavy ETOH in the past, now drinking around 1/2 pint liquor daily.    ROS: All systems reviewed and negative except as per HPI.    Current Outpatient Prescriptions  Medication Sig  Dispense Refill  . aspirin (ASPIR-LOW) 81 MG EC tablet Take 81 mg by mouth daily.       . carvedilol (COREG) 25 MG tablet Take 1 tablet (25 mg total) by mouth 2 (two) times daily.  60 tablet  11  . docusate sodium (COLACE) 50 MG capsule Take 1 capsule (50 mg total) by mouth 2 (two) times daily as needed for mild constipation.  30 capsule  3  . EPINEPHrine (EPIPEN) 0.3 mg/0.3 mL SOAJ injection Inject 0.3 mL (0.3 mg total) into the muscle once as needed (anaphylaxis).  1 Device  1  . furosemide (LASIX) 40 MG tablet TAKE 1& 1/2 TABLETS BY MOUTH TWICE DAILY  270 tablet  0  . isosorbide-hydrALAZINE (BIDIL) 20-37.5 MG per tablet Take 1 tablet by  mouth 3 (three) times daily.  90 tablet  1  . losartan (COZAAR) 50 MG tablet 1 and 1/2 tablets ( ) daily  45 tablet  3  . Multiple Vitamin (MULTIVITAMIN WITH MINERALS) TABS Take 1 tablet by mouth daily.      . naproxen sodium (ALEVE) 220 MG tablet Take 1 tablet (220 mg total) by mouth 2 (two) times daily as needed.  30 tablet  0  . nicotine (NICODERM CQ) 14 mg/24hr patch Place 1 patch (14 mg total) onto the skin daily.  28 patch  3  . omeprazole (PRILOSEC) 40 MG capsule TAKE ONE CAPSULE BY MOUTH DAILY BEFORE BREAKFAST  90 capsule  1  . spironolactone (ALDACTONE) 25 MG tablet TAKE 1 TABLET BY MOUTH EVERY DAY  90 tablet  0   No current facility-administered medications for this encounter.    BP 122/88  Pulse 90  Wt 277 lb 1.9 oz (125.701 kg)  SpO2 98% General: NAD, obese Neck: Thick, JVP difficult but does not appear particularly elevated today, no thyromegaly or thyroid nodule.  Lungs: Decreased breath sounds at bases.  CV: Nondisplaced PMI.  Heart regular S1/S2, no S3/S4, no murmur.  No peripheral edema.  No carotid bruit.  Normal pedal pulses.  Abdomen: Soft, nontender, no hepatosplenomegaly, no distention.  Neurologic: Alert and oriented x 3.  Psych: Normal affect. Extremities: No clubbing or cyanosis.   Assessment/Plan:  CHRONIC SYSTOLIC HEART FAILURE  Nonischemic cardiomyopathy, possibly ETOH-related.  Echo in 5/15 with EF 30-35%. Stable NYHA class III symptoms.  He is still drinking. - Continue current doses of Coreg, spironolactone, and Bidil. He has not tolerated uptitration of Bidil due to headache.  - Increase losartan to 75 mg daily, BP is stable.  I am not sure that angioedema was due to ACEI to begin with and even if it was, having angioedema with ARB when one has had angioedema with ACEI is very rare.  - Continue Lasix at 60 bid.   - BMET in 2 wks.   - Not candidate for ICD until he can quit drinking => I reiterated this today. He is still drinking hard liquor.  OSA   I encouraged him to be more regular with CPAP use. TOBACCO ABUSE I counselled him again to quit smoking. I recommended that he try nicotine patches.  Obesity He was interested in bariatric surgery, but insurance will not cover apparently. Weight still rising slowly.    [name] 09/24/2013

## 2013-10-07 ENCOUNTER — Telehealth (HOSPITAL_COMMUNITY): Payer: Self-pay | Admitting: Vascular Surgery

## 2013-10-07 NOTE — Telephone Encounter (Signed)
Advised pharmacy we have not received anything for PA for losartan, they faxed info to the wrong office provided our fax number and they will refax

## 2013-10-07 NOTE — Telephone Encounter (Signed)
Called [facility] at [phone] (pt's ID [id number]) medication was approved through 10/02/14, auth # [id number], [facility] aware

## 2013-10-07 NOTE — Telephone Encounter (Signed)
[facility] called checking on a prior auth for Larsartan.Ethan Wilcox Please advise

## 2013-10-21 ENCOUNTER — Other Ambulatory Visit: Payer: Self-pay | Admitting: Cardiology

## 2013-10-22 ENCOUNTER — Ambulatory Visit (INDEPENDENT_AMBULATORY_CARE_PROVIDER_SITE_OTHER): Payer: Medicaid Other | Admitting: Internal Medicine

## 2013-10-22 ENCOUNTER — Encounter: Payer: Self-pay | Admitting: Internal Medicine

## 2013-10-22 VITALS — BP 133/89 | HR 97 | Temp 98.2°F | Ht 67.0 in | Wt 277.2 lb

## 2013-10-22 DIAGNOSIS — I10 Essential (primary) hypertension: Secondary | ICD-10-CM

## 2013-10-22 DIAGNOSIS — I5022 Chronic systolic (congestive) heart failure: Secondary | ICD-10-CM

## 2013-10-22 DIAGNOSIS — Z299 Encounter for prophylactic measures, unspecified: Secondary | ICD-10-CM

## 2013-10-22 DIAGNOSIS — K644 Residual hemorrhoidal skin tags: Secondary | ICD-10-CM | POA: Insufficient documentation

## 2013-10-22 DIAGNOSIS — K648 Other hemorrhoids: Secondary | ICD-10-CM

## 2013-10-22 DIAGNOSIS — Z23 Encounter for immunization: Secondary | ICD-10-CM

## 2013-10-22 DIAGNOSIS — Z418 Encounter for other procedures for purposes other than remedying health state: Secondary | ICD-10-CM

## 2013-10-22 DIAGNOSIS — K219 Gastro-esophageal reflux disease without esophagitis: Secondary | ICD-10-CM

## 2013-10-22 MED ORDER — POLYETHYLENE GLYCOL 3350 17 G PO PACK: 17.0000 g | PACK | Freq: Every day | ORAL | Status: DC

## 2013-10-22 MED ORDER — OMEPRAZOLE 40 MG PO CPDR: 40.0000 mg | DELAYED_RELEASE_CAPSULE | Freq: Every day | ORAL | Status: DC

## 2013-10-22 MED ORDER — FUROSEMIDE 40 MG PO TABS: 60.0000 mg | ORAL_TABLET | Freq: Every day | ORAL | Status: DC

## 2013-10-22 MED ORDER — HYDROCORTISONE 2.5 % RE CREA: 1.0000 "application " | TOPICAL_CREAM | Freq: Two times a day (BID) | RECTAL | Status: DC

## 2013-10-22 MED ORDER — ISOSORB DINITRATE-HYDRALAZINE 20-37.5 MG PO TABS: 1.0000 | ORAL_TABLET | Freq: Three times a day (TID) | ORAL | Status: DC

## 2013-10-22 MED ORDER — CARVEDILOL 25 MG PO TABS: 25.0000 mg | ORAL_TABLET | Freq: Two times a day (BID) | ORAL | Status: DC

## 2013-10-22 MED ORDER — LOSARTAN POTASSIUM 50 MG PO TABS: ORAL_TABLET | ORAL | Status: DC

## 2013-10-22 MED ORDER — SPIRONOLACTONE 25 MG PO TABS: 25.0000 mg | ORAL_TABLET | Freq: Every day | ORAL | Status: DC

## 2013-10-22 NOTE — Progress Notes (Signed)
Patient ID: Ethan Wilcox, male   DOB: 09-13-1955, 58 y.o.   MRN: [id number]   Subjective:   HPI: [name] is a 58 y.o. gentleman, with past medical history of congestive heart failure, secondary to nonischemic cardiomyopathy, hypertension, obesity, obstructive sleep apnea, excessive alcohol use and cigarette smoking, presents for a routine follow up visit.   Reason(s) for this visit: Rectal bleeding: Patient has a past medical history of external hemorrhoids. Most recently, patient notes some increased bleeding from the rectum when he takes a bowel movement. The blood tends to come after the stool. The bleeding tends to be associated with rectal pain. Previously I had prescribed Colace for constipation however, patient was unable to afford this medication. His last colonoscopy was several years ago (>27yrs), and we don't have the report on his chart. We will request for reports today for his gastroenterologist.   The patient is otherwise feeling well. He is compliant with his medications and he brings in all his pill bottles.  Kindly see the A&P for the status of the pt's chronic medical problems.   ROS: Constitutional: Denies fever, chills, diaphoresis, appetite change and fatigue.  Respiratory: Denies SOB, DOE, cough, chest tightness, and wheezing. Denies chest pain. CVS: No chest pain, palpitations and leg swelling.  GU: No dysuria, frequency, hematuria, or flank pain.  MSK: No myalgias, back pain, joint swelling, arthralgias  Psych: No depression symptoms. No SI or SA.    Objective:  Physical Exam: Filed Vitals:   10/22/13 1430  BP: 133/89  Pulse: 97  Temp: 98.2 F (36.8 C)  TempSrc: Oral  Height: 5\' 7"  (1.702 m)  Weight: 277 lb 3.2 oz (125.737 kg)  SpO2: 98%   General: Well nourished. No acute distress.  HEENT: Normal oral mucosa. MMM.  Lungs: CTA bilaterally. Heart: RRR; no extra sounds or murmurs  Abdomen: Non-distended, normal bowel sounds, soft,  nontender; no hepatosplenomegaly Rectum:external hemorrhoids without complications like thrombosis or suspicion. Nontender. Digital exam was not performed.  Extremities: No pedal edema. No joint swelling or tenderness. Neurologic: Normal EOM,  Alert and oriented x3. No obvious neurologic/cranial nerve deficits.  Assessment & Plan:  Discussed case with my attending in the clinic, Dr. Criselda Peaches See problem based charting.

## 2013-10-22 NOTE — Assessment & Plan Note (Signed)
Well controlled. BP today is 133/89. Continue with current medications including BiDil, furosemide, Coreg, and losartan.

## 2013-10-22 NOTE — Assessment & Plan Note (Signed)
This is the most likely cause of his rectal bleeding. No complications, like infection, or thrombosis. Plan -Recommended Miralax -Anusol cream -GI Referral be considered if symptoms persist. Patient has unpaid bills with GI.

## 2013-10-22 NOTE — Assessment & Plan Note (Signed)
Euvolemic on exam. Followup with cardiology. Continue with current medications.

## 2013-10-22 NOTE — Patient Instructions (Addendum)
General Instructions: Please try using Miralax for stool softener and Anusol cream  Please take your medications as before  Please follow up with your heart doctor  Thank you for bringing your medicines today. This helps [place] keep you safe from mistakes.   Progress Toward Treatment Goals:  Treatment Goal 07/30/2013  Blood pressure unchanged  Stop smoking smoking the same amount    Self Care Goals & Plans:  Self Care Goal 10/22/2013  Manage my medications take my medicines as prescribed; bring my medications to every visit; refill my medications on time  Eat healthy foods eat more vegetables; eat foods that are low in salt; eat baked foods instead of fried foods    No flowsheet data found.   Care Management & Community Referrals:  Referral 07/30/2013  Referrals made for care management support health educator

## 2013-10-22 NOTE — Assessment & Plan Note (Signed)
Symptoms are stable. Requested for refill of his omeprazole.

## 2013-10-22 NOTE — Assessment & Plan Note (Signed)
tdap and flushot given today °

## 2013-10-23 ENCOUNTER — Encounter: Payer: Self-pay | Admitting: Internal Medicine

## 2013-11-01 NOTE — Progress Notes (Signed)
Internal Medicine Clinic Attending  Case discussed with Dr. Kazibwe soon after the resident saw the patient.  We reviewed the resident's history and exam and pertinent patient test results.  I agree with the assessment, diagnosis, and plan of care documented in the resident's note. 

## 2013-12-03 ENCOUNTER — Emergency Department (HOSPITAL_COMMUNITY): Payer: Medicaid Other

## 2013-12-03 ENCOUNTER — Emergency Department (HOSPITAL_COMMUNITY)
Admission: EM | Admit: 2013-12-03 | Discharge: 2013-12-04 | Disposition: A | Discharge status: Home / Self Care | Payer: Medicaid Other | Attending: Emergency Medicine | Admitting: Emergency Medicine

## 2013-12-03 ENCOUNTER — Encounter (HOSPITAL_COMMUNITY): Payer: Self-pay | Admitting: *Deleted

## 2013-12-03 DIAGNOSIS — R079 Chest pain, unspecified: Secondary | ICD-10-CM | POA: Diagnosis present

## 2013-12-03 DIAGNOSIS — Z7982 Long term (current) use of aspirin: Secondary | ICD-10-CM | POA: Insufficient documentation

## 2013-12-03 DIAGNOSIS — K219 Gastro-esophageal reflux disease without esophagitis: Secondary | ICD-10-CM | POA: Diagnosis not present

## 2013-12-03 DIAGNOSIS — Z79899 Other long term (current) drug therapy: Secondary | ICD-10-CM | POA: Diagnosis not present

## 2013-12-03 DIAGNOSIS — I509 Heart failure, unspecified: Secondary | ICD-10-CM | POA: Diagnosis not present

## 2013-12-03 DIAGNOSIS — M109 Gout, unspecified: Secondary | ICD-10-CM | POA: Insufficient documentation

## 2013-12-03 DIAGNOSIS — R0789 Other chest pain: Secondary | ICD-10-CM | POA: Diagnosis not present

## 2013-12-03 DIAGNOSIS — Z7952 Long term (current) use of systemic steroids: Secondary | ICD-10-CM | POA: Insufficient documentation

## 2013-12-03 DIAGNOSIS — Z720 Tobacco use: Secondary | ICD-10-CM | POA: Insufficient documentation

## 2013-12-03 DIAGNOSIS — I10 Essential (primary) hypertension: Secondary | ICD-10-CM | POA: Insufficient documentation

## 2013-12-03 DIAGNOSIS — Z880 Allergy status to penicillin: Secondary | ICD-10-CM | POA: Diagnosis not present

## 2013-12-03 DIAGNOSIS — E669 Obesity, unspecified: Secondary | ICD-10-CM | POA: Insufficient documentation

## 2013-12-03 LAB — CBC
HCT: 39 % (ref 39.0–52.0)
HEMOGLOBIN: 13 g/dL (ref 13.0–17.0)
MCH: 29.9 pg (ref 26.0–34.0)
MCHC: 33.3 g/dL (ref 30.0–36.0)
MCV: 89.7 fL (ref 78.0–100.0)
PLATELETS: 281 10*3/uL (ref 150–400)
RBC: 4.35 MIL/uL (ref 4.22–5.81)
RDW: 13.6 % (ref 11.5–15.5)
WBC: 7.9 10*3/uL (ref 4.0–10.5)

## 2013-12-03 LAB — PRO B NATRIURETIC PEPTIDE: Pro B Natriuretic peptide (BNP): 257.5 pg/mL — ABNORMAL HIGH (ref 0–125)

## 2013-12-03 LAB — BASIC METABOLIC PANEL
Anion gap: 16 — ABNORMAL HIGH (ref 5–15)
BUN: 18 mg/dL (ref 6–23)
CALCIUM: 9.2 mg/dL (ref 8.4–10.5)
CHLORIDE: 97 meq/L (ref 96–112)
CO2: 22 mEq/L (ref 19–32)
Creatinine, Ser: 1.17 mg/dL (ref 0.50–1.35)
GFR calc Af Amer: 78 mL/min — ABNORMAL LOW (ref >90)
GFR calc non Af Amer: 68 mL/min — ABNORMAL LOW (ref >90)
GLUCOSE: 112 mg/dL — AB (ref 70–99)
POTASSIUM: 4.8 meq/L (ref 3.7–5.3)
SODIUM: 135 meq/L — AB (ref 137–147)

## 2013-12-03 LAB — I-STAT TROPONIN, ED
TROPONIN I, POC: 0.04 ng/mL (ref 0.00–0.08)
TROPONIN I, POC: 0.03 ng/mL (ref 0.00–0.08)

## 2013-12-03 NOTE — ED Provider Notes (Signed)
CSN: [id number]     Arrival date & time 12/03/13  1842 History   First MD Initiated Contact with Patient 12/03/13 1843     Chief Complaint  Patient presents with  . Chest Pain      Patient is a 58 y.o. male presenting with chest pain. The history is provided by the patient.  Chest Pain  [name] is here for evaluation of left-sided chest pain. Chest pain started about an hour and half prior to ED arrival and is described as a pain that is sharp in nature.  The pain is nonradiating and constant. He does have some shortness of breath but he has chronic shortness of breath. He denies fevers, cough, leg swelling, abdominal pain. He feels like he might have some gas. He had similar symptoms once previously but he never had an evaluation and the symptoms resolved on their own. He took 5 baby aspirin at home and called EMS. Per EMS he had an initial blood pressure of 110 systolic after 1 dose of nitroglycerin his blood pressure dropped to 70.  Past Medical History  Diagnosis Date  . Nonischemic cardiomyopathy     admitted 5/11 w acute systolic CHF. LHC showed no angiographic CAD. Echo (5/11) showed EF 15-20% w diffuse sever hypokinesis, moderate MR> RHC showed PCWP 10, CI 1.47 (Fick.) cause of CMP thought to be heavy ETOH and cocaine. No ICD yet due to ongoing ETOH intake  . ETOH abuse   . Cocaine abuse   . Active smoker /    1/2 ppd  . Obesity   . Hypertension   . CHF (congestive heart failure)   . Gout   . GERD (gastroesophageal reflux disease)   . Sleep apnea   . 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 No past surgeries     Family History  Problem Relation Age of Onset  . Heart disease Mother   . Cancer Mother   . Heart disease Father    History  Substance Use Topics  . Smoking status: Current Every Day  Smoker -- 1.00 packs/day for 38 years    Types: Cigarettes  . Smokeless tobacco: Not on file     Comment: was on patches.  Was stopped.  Restarted smoking/ 1/2 PPD  . Alcohol Use: 7.2 oz/week    12 Shots of liquor per week     Comment: Vodka 1/2 pint 3 days a week    Review of Systems  Cardiovascular: Positive for chest pain.  All other systems reviewed and are negative.     Allergies  Shrimp; Lisinopril; and Penicillins  Home Medications   Prior to Admission medications   Medication Sig Start Date End Date Taking? Authorizing Provider  aspirin (ASPIR-LOW) 81 MG EC tablet Take 81 mg by mouth daily.     Historical Provider, MD  carvedilol (COREG) 25 MG tablet Take 1 tablet (25 mg total) by mouth 2 (two) times daily. 10/22/13 10/22/14  Dow Adolphichard Kazibwe, MD  EPINEPHrine (EPIPEN) 0.3 mg/0.3 mL SOAJ injection Inject 0.3 mL (0.3 mg total) into the muscle once as needed (anaphylaxis). 08/24/12   Leroy SeaPrashant K Singh, MD  furosemide (LASIX) 40 MG tablet Take 1.5 tablets (60 mg total) by mouth daily. 10/22/13   Dow Adolphichard Kazibwe, MD  hydrocortisone (ANUSOL-HC) 2.5 % rectal cream Place 1 application rectally  2 (two) times daily. 10/22/13   Dow Adolph, MD  isosorbide-hydrALAZINE (BIDIL) 20-37.5 MG per tablet Take 1 tablet by mouth 3 (three) times daily. 10/22/13   Dow Adolph, MD  losartan (COZAAR) 50 MG tablet 1 and 1/2 tablets (75mg ) daily 10/22/13   Dow Adolph, MD  Multiple Vitamin (MULTIVITAMIN WITH MINERALS) TABS Take 1 tablet by mouth daily.    Historical Provider, MD  naproxen sodium (ALEVE) 220 MG tablet Take 1 tablet (220 mg total) by mouth 2 (two) times daily as needed. 02/26/13   Dow Adolph, MD  nicotine (NICODERM CQ) 14 mg/24hr patch Place 1 patch (14 mg total) onto the skin daily. 07/29/13   Laurey Morale, MD  omeprazole (PRILOSEC) 40 MG capsule Take 1 capsule (40 mg total) by mouth daily. 10/22/13   Dow Adolph, MD  polyethylene glycol [facility] / GLYCOLAX) packet  Take 17 g by mouth daily. 10/22/13   Dow Adolph, MD  spironolactone (ALDACTONE) 25 MG tablet Take 1 tablet (25 mg total) by mouth daily. 10/22/13   Dow Adolph, MD   BP 80/62 mmHg  Pulse 94  Temp(Src) 98.2 F (36.8 C) (Oral)  Resp 20  Ht 5\' 7"  (1.702 m)  Wt 267 lb (121.11 kg)  BMI 41.81 kg/m2  SpO2 93% Physical Exam  Constitutional: He is oriented to person, place, and time. He appears well-developed and well-nourished.  Moderate distress  HENT:  Head: Normocephalic and atraumatic.  Cardiovascular: Normal rate.   No murmur heard. Pulmonary/Chest: Effort normal. No respiratory distress.  Abdominal: Soft. There is no tenderness. There is no rebound and no guarding.  Musculoskeletal: He exhibits no edema or tenderness.  Neurological: He is alert and oriented to person, place, and time.  Skin: Skin is warm and dry.  Psychiatric: He has a normal mood and affect. His behavior is normal.  Nursing note and vitals reviewed.   ED Course  Procedures (including critical care time) Labs Review Labs Reviewed  BASIC METABOLIC PANEL - Abnormal; Notable for the following:    Sodium 135 (*)    Glucose, Bld 112 (*)    GFR calc non Af Amer 68 (*)    GFR calc Af Amer 78 (*)    Anion gap 16 (*)    All other components within normal limits  PRO B NATRIURETIC PEPTIDE - Abnormal; Notable for the following:    Pro B Natriuretic peptide (BNP) 257.5 (*)    All other components within normal limits  CBC  I-STAT TROPOININ, ED  Rosezena Sensor, ED    Imaging Review Dg Chest Port 1 View  12/03/2013   CLINICAL DATA:  Left-sided chest pain with difficulty breathing  EXAM: PORTABLE CHEST - 1 VIEW  COMPARISON:  02/22/2013  FINDINGS: Cardiac silhouette is enlarged compared to prior. Exam is lordotic which exaggerates the cardiac silhouette There are low lung volumes. There is central venous congestion. No pneumothorax. No overt pulmonary edema.  IMPRESSION: 1. Large cardiac silhouette either  represents cardiomegaly or new pericardial effusion. 2. Central venous pulmonary congestion low lung volumes   Electronically Signed   By: Genevive Bi M.D.   On: 12/03/2013 19:21     EKG Interpretation   Date/Time:  Wednesday December 03 2013 18:53:36 EST Ventricular Rate:  85 PR Interval:  148 QRS Duration: 82 QT Interval:  427 QTC Calculation: 508 R Axis:   5 Text Interpretation:  Sinus rhythm Atrial premature complexes Abnormal  R-wave progression, early transition Borderline T wave abnormalities  Prolonged QT interval Confirmed  by Lincoln Brigham [phone]) on 12/03/2013 7:02:12  PM      MDM   Final diagnoses:  Other chest pain    Patient here with chest pain, developed marked hypotension with nitroglycerin. Hypotension improved with cessation of nitroglycerin and administration of IV fluids. Patient does seem to have some mild CHF without volume overload and some cardiomegaly. Discussed with patient Lasix use and provided one dose in the emergency department. Patient would like to go home and is pain-free on recheck plan to DC home with close cardiology outpatient follow-up return precautions were discussed. Discussed with patient and daily weights and avoiding salt salt intake. Clinical picture not consistent with ACS or PE.    Tilden Fossa, MD 12/04/13 [phone]

## 2013-12-03 NOTE — ED Notes (Signed)
Pt c/o dizziness , BP at this time 77/53. Dr Madilyn Hook made aware and at the bedside.

## 2013-12-03 NOTE — ED Notes (Signed)
Pt arrives via EMS from home. States that he was cleaning his house today and started experiencing left sided chest pain that felt dull. Pt states that he did not eat a lot today and thinks the pain is from that. EMS CBG 140. Pt ambulatory on scene.

## 2013-12-04 MED ORDER — FUROSEMIDE 10 MG/ML IJ SOLN
40.0000 mg | Freq: Once | INTRAMUSCULAR | Status: AC
  Administered 2013-12-04: 40 mg via INTRAVENOUS
  Filled 2013-12-04: qty 4

## 2013-12-04 NOTE — ED Notes (Signed)
Rees,MD at bedside.  

## 2013-12-04 NOTE — Discharge Instructions (Signed)

## 2013-12-16 ENCOUNTER — Ambulatory Visit (INDEPENDENT_AMBULATORY_CARE_PROVIDER_SITE_OTHER): Payer: Medicaid Other | Admitting: Internal Medicine

## 2013-12-16 VITALS — BP 110/62 | HR 65 | Temp 98.0°F | Ht 67.0 in | Wt 277.6 lb

## 2013-12-16 DIAGNOSIS — G4700 Insomnia, unspecified: Secondary | ICD-10-CM

## 2013-12-16 DIAGNOSIS — S39011A Strain of muscle, fascia and tendon of abdomen, initial encounter: Secondary | ICD-10-CM | POA: Insufficient documentation

## 2013-12-16 DIAGNOSIS — I5022 Chronic systolic (congestive) heart failure: Secondary | ICD-10-CM

## 2013-12-16 DIAGNOSIS — B353 Tinea pedis: Secondary | ICD-10-CM

## 2013-12-16 DIAGNOSIS — I10 Essential (primary) hypertension: Secondary | ICD-10-CM

## 2013-12-16 MED ORDER — TERBINAFINE HCL 1 % EX CREA: 1.0000 "application " | TOPICAL_CREAM | Freq: Two times a day (BID) | CUTANEOUS | Status: DC

## 2013-12-16 MED ORDER — ZOLPIDEM TARTRATE 5 MG PO TABS: 5.0000 mg | ORAL_TABLET | Freq: Every evening | ORAL | Status: DC | PRN

## 2013-12-16 MED ORDER — NAPROXEN 500 MG PO TABS: 500.0000 mg | ORAL_TABLET | Freq: Two times a day (BID) | ORAL | Status: DC

## 2013-12-16 NOTE — Patient Instructions (Signed)
General Instructions:   Please bring your medicines with you each time you come to clinic.  Medicines may include prescription medications, over-the-counter medications, herbal remedies, eye drops, vitamins, or other pills.  For your abdominal pain and right elbow pain: -Take Naproxen 500 mg twice a day with meals  -Let us know if your pain worsens  For your insomnia: -Take Ambien 5 mg at night -Try to avoid napping during the day -Avoid watching tv late at night  For your feet: -Use lamisil cream -Keep area clean and dry  Insomnia Insomnia is frequent trouble falling and/or staying asleep. Insomnia can be a long term problem or a short term problem. Both are common. Insomnia can be a short term problem when the wakefulness is related to a certain stress or worry. Long term insomnia is often related to ongoing stress during waking hours and/or poor sleeping habits. Overtime, sleep deprivation itself can make the problem worse. Every little thing feels more severe because you are overtired and your ability to cope is decreased. CAUSES   Stress, anxiety, and depression.  Poor sleeping habits.  Distractions such as TV in the bedroom.  Naps close to bedtime.  Engaging in emotionally charged conversations before bed.  Technical reading before sleep.  Alcohol and other sedatives. They may make the problem worse. They can hurt normal sleep patterns and normal dream activity.  Stimulants such as caffeine for several hours prior to bedtime.  Pain syndromes and shortness of breath can cause insomnia.  Exercise late at night.  Changing time zones may cause sleeping problems (jet lag). It is sometimes helpful to have someone observe your sleeping patterns. They should look for periods of not breathing during the night (sleep apnea). They should also look to see how long those periods last. If you live alone or observers are uncertain, you can also be observed at a sleep clinic where  your sleep patterns will be professionally monitored. Sleep apnea requires a checkup and treatment. Give your caregivers your medical history. Give your caregivers observations your family has made about your sleep.  SYMPTOMS   Not feeling rested in the morning.  Anxiety and restlessness at bedtime.  Difficulty falling and staying asleep. TREATMENT   Your caregiver may prescribe treatment for an underlying medical disorders. Your caregiver can give advice or help if you are using alcohol or other drugs for self-medication. Treatment of underlying problems will usually eliminate insomnia problems.  Medications can be prescribed for short time use. They are generally not recommended for lengthy use.  Over-the-counter sleep medicines are not recommended for lengthy use. They can be habit forming.  You can promote easier sleeping by making lifestyle changes such as:  Using relaxation techniques that help with breathing and reduce muscle tension.  Exercising earlier in the day.  Changing your diet and the time of your last meal. No night time snacks.  Establish a regular time to go to bed.  Counseling can help with stressful problems and worry.  Soothing music and white noise may be helpful if there are background noises you cannot remove.  Stop tedious detailed work at least one hour before bedtime. HOME CARE INSTRUCTIONS   Keep a diary. Inform your caregiver about your progress. This includes any medication side effects. See your caregiver regularly. Take note of:  Times when you are asleep.  Times when you are awake during the night.  The quality of your sleep.  How you feel the next day. This information will help  your caregiver care for you.  Get out of bed if you are still awake after 15 minutes. Read or do some quiet activity. Keep the lights down. Wait until you feel sleepy and go back to bed.  Keep regular sleeping and waking hours. Avoid naps.  Exercise  regularly.  Avoid distractions at bedtime. Distractions include watching television or engaging in any intense or detailed activity like attempting to balance the household checkbook.  Develop a bedtime ritual. Keep a familiar routine of bathing, brushing your teeth, climbing into bed at the same time each night, listening to soothing music. Routines increase the success of falling to sleep faster.  Use relaxation techniques. This can be using breathing and muscle tension release routines. It can also include visualizing peaceful scenes. You can also help control troubling or intruding thoughts by keeping your mind occupied with boring or repetitive thoughts like the old concept of counting sheep. You can make it more creative like imagining planting one beautiful flower after another in your backyard garden.  During your day, work to eliminate stress. When this is not possible use some of the previous suggestions to help reduce the anxiety that accompanies stressful situations. MAKE SURE YOU:   Understand these instructions.  Will watch your condition.  Will get help right away if you are not doing well or get worse. Document Released: 12/24/1999 Document Revised: 03/20/2011 Document Reviewed: 01/23/2007 [facility] Patient Information 2015 [place], [place]. This information is not intended to replace advice given to you by your health care provider. Make sure you discuss any questions you have with your health care provider.

## 2013-12-16 NOTE — Progress Notes (Signed)
Subjective:    Patient ID: Ethan Wilcox, male    DOB: 01/29/1955, 58 y.o.   MRN: [id number]  HPI [name] is a 58 yo male with PMHx of HTN, systolic CHF, OSA, GERD, OA, tobacco and alcohol abuse who presents to the clinic for ED follow up after being seen for chest pain. Please see problem oriented assessment and plan for more details.  Review of Systems General: Admits to fatigue. Denies fever, chills Respiratory: Denies increased SOB, cough, DOE, chest tightness, and wheezing.   Cardiovascular: Denies chest pain and palpitations.  Gastrointestinal: Admits to lower abdominal pain in abdominal fold in suprapubic area. Denies nausea, vomiting, diarrhea, constipation, blood in stool and abdominal distention.  Genitourinary: Denies dysuria, urgency, frequency, hematuria Musculoskeletal: Admits to myalgias (lower abdomen), and arthralgias (chronic). Denies joint swelling and gait problem.  Skin: Admits to rash on feet bilaterally. Denies pallor and wounds.  Neurological: Denies dizziness, headaches, weakness, lightheadedness  Past Medical History  Diagnosis Date  . Nonischemic cardiomyopathy     admitted 5/11 w acute systolic CHF. LHC showed no angiographic CAD. Echo (5/11) showed EF 15-20% w diffuse sever hypokinesis, moderate MR> RHC showed PCWP 10, CI 1.47 (Fick.) cause of CMP thought to be heavy ETOH and cocaine. No ICD yet due to ongoing ETOH intake  . ETOH abuse   . Cocaine abuse   . Active smoker /    1/2 ppd  . Obesity   . Hypertension   . CHF (congestive heart failure)   . Gout   . GERD (gastroesophageal reflux disease)   . Sleep apnea   . Hx of anaphylaxis with angioedema 08/23/2012    Admitted with angioedema on 08/24/2012 No intubation Unknown allergen Shrimp Vs Spices Vs ACEis Stopped Lisinopril    Current Outpatient Prescriptions on File Prior to Visit  Medication Sig Dispense Refill  . aspirin (ASPIR-LOW) 81 MG EC tablet Take 81 mg by mouth daily.     . carvedilol  (COREG) 25 MG tablet Take 1 tablet (25 mg total) by mouth 2 (two) times daily. 60 tablet 11  . diphenhydrAMINE (BENADRYL) 25 MG tablet Take 25 mg by mouth every 6 (six) hours as needed for allergies.    [name]. EPINEPHrine (EPIPEN) 0.3 mg/0.3 mL SOAJ injection Inject 0.3 mL (0.3 mg total) into the muscle once as needed (anaphylaxis). 1 Device 1  . furosemide (LASIX) 40 MG tablet Take 1.5 tablets (60 mg total) by mouth daily. 270 tablet 0  . hydrocortisone (ANUSOL-HC) 2.5 % rectal cream Place 1 application rectally 2 (two) times daily. 30 g 1  . isosorbide-hydrALAZINE (BIDIL) 20-37.5 MG per tablet Take 1 tablet by mouth 3 (three) times daily. 90 tablet 1  . Multiple Vitamin (MULTIVITAMIN WITH MINERALS) TABS Take 1 tablet by mouth daily.    [name]. omeprazole (PRILOSEC) 40 MG capsule Take 1 capsule (40 mg total) by mouth daily. 90 capsule 1  . polyethylene glycol (MIRALAX / GLYCOLAX) packet Take 17 g by mouth daily. 14 each 6  . spironolactone (ALDACTONE) 25 MG tablet Take 1 tablet (25 mg total) by mouth daily. 90 tablet 0   No current facility-administered medications on file prior to visit.      Objective:   Physical Exam Filed Vitals:   12/16/13 1439  BP: 110/62  Pulse: 65  Temp: 98 F (36.7 C)  TempSrc: Oral  Height: 5\' 7"  (1.702 m)  Weight: 277 lb 9.6 oz (125.919 kg)  SpO2: 99%   General: Vital signs reviewed.  Patient is well-developed and well-nourished, obese, in no acute distress and cooperative with exam.  Cardiovascular: RRR, S1 normal, S2 normal, no murmurs, gallops, or rubs. Pulmonary/Chest: Clear to auscultation bilaterally, no wheezes, rales, or rhonchi. Abdominal: Soft, non-tender, non-distended, obese, BS +, no masses, organomegaly, or guarding present. No evidence of hernia, infection, rash or irritation, no tenderness of palpation within abdominal fold or suprapubic tenderness.  Extremities: No lower extremity edema bilaterally Skin: Scaling rash located over toes and nails  bilaterally in feet consistent with tinea pedis. No maceration or laceration between 4th and 5th digit on right foot. No edema, erythema, or increased warmth over lateral digits.  Psychiatric: Normal mood and affect. speech and behavior is normal. Cognition and memory are normal.     Assessment & Plan:   Please see problem based assessment and plan.

## 2013-12-16 NOTE — Assessment & Plan Note (Signed)
Assessment: Tinea pedis of bilateral feet causing pain between 4th and 5th digit of right foot. No evidence of laceration, edema, erythema or increased warmth. I do not suspect gout, septic arthritis trauma or infection. Patient is currently using miconazole powder without relief.  Plan: -Terbinafine cream BID

## 2013-12-16 NOTE — Assessment & Plan Note (Addendum)
Assessment: Patient presented to the ED on 11/25 with complaints of left sided chest pain, sharp, non-radiating, constant associated with shortness of breath. He did admit to flatulence at the time. Patient was given nitroglycerin in via EMS which dropped his BP from 110 systolic to 70s systolic. Pro-BNP was 257.5, elevated above baseline, although patient did not appear clinically volume overloaded. Troponin poc 0.03 and 0.04. CXR showed cardiomegaly versus new pericardial effusion with central pulmonary vascular congestion. CBC and BMET were within normal limit. Patient was given an extra dose of lasix with resolution of symptoms are counseled on salt restriction. Patient has been compliant with his medications including Lasix 60 mg daily. He reports no recurrent chest pain or shortness of breath.  Plan: -Continue current medications of ASA 81 mg daily, Carvedilol 25 mg BID, Furosemide 60 mg daily, isosorbide-hydralazine 20-37.5 mg TID, and spironolactone 25 mg daily.

## 2013-12-16 NOTE — Assessment & Plan Note (Signed)
BP Readings from Last 3 Encounters:  12/16/13 110/62  12/04/13 129/70  10/22/13 133/89    Lab Results  Component Value Date   NA 135* 12/03/2013   K 4.8 12/03/2013   CREATININE 1.17 12/03/2013    Assessment: Blood pressure control:  Controlled Progress toward BP goal:   At goal Comments: None  Plan: Medications:  continue current medications Educational resources provided:  none Self management tools provided:  none Other plans: None

## 2013-12-16 NOTE — Assessment & Plan Note (Signed)
Assessment: Patient presents with 1.5 week history of lower abdominal muscle pain with movement (sitting up, twisting, initiating walking). Patient has been moving furniture around his house and lifting heavy items. He denies any associated symptoms such as fever, chills, constipation, diarrhea, nausea, vomiting, dysuria. Patient has not history of surgery or intra-abdominal surgery. Physical exam is unremarkable-no tenderness or evidence of hernia. No evidence of rash, infection or irritation within abdominal fold. Pain is likely secondary to a muscle strain.  Plan: -Naproxen 500 mg BID

## 2013-12-16 NOTE — Assessment & Plan Note (Signed)
Assessment: Patient complains of trouble falling asleep at night. Patient tries to fall asleep around 11 pm but cannot sleep until 1-3 am. He admits to daytime somnolence and taking Tylenol PM q6H during the day. He will often take long naps during the day. I counseled the patient on sleep hygiene: not sleeping during the day, not watching tv or being on the phone before bed and avoidance of caffeine late in the day.  Plan: -STOP Tylenol PM -Ambien 5 mg QHS -Sleep Hygiene

## 2013-12-17 NOTE — Progress Notes (Signed)
INTERNAL MEDICINE TEACHING ATTENDING ADDENDUM - Earl Lagos, MD: I personally saw and evaluated Mr. Scalzitti in this clinic visit in conjunction with the resident, Dr. Senaida Ores. I have discussed patient's plan of care with medical resident during this visit. I have confirmed the physical exam findings and have read and agree with the clinic note including the plan with the following addition: - Pt with likely fungal infection despite miconazole use. Will start terbinafine - Start NSAID for likely abd strain - BP well controlled

## 2014-01-05 ENCOUNTER — Telehealth: Payer: Self-pay | Admitting: *Deleted

## 2014-01-05 NOTE — Telephone Encounter (Signed)
Pt called with c/o no improvement in condition of feet.   Pt seen in clinic on 12/8 for Tinea Pedis both feet.  I called pt back to get more information and no answer.  Message left to call clinic

## 2014-01-06 ENCOUNTER — Emergency Department (HOSPITAL_COMMUNITY): Payer: Medicaid Other

## 2014-01-06 ENCOUNTER — Emergency Department (HOSPITAL_COMMUNITY)
Admission: EM | Admit: 2014-01-06 | Discharge: 2014-01-06 | Disposition: A | Discharge status: Home / Self Care | Payer: Medicaid Other | Attending: Emergency Medicine | Admitting: Emergency Medicine

## 2014-01-06 ENCOUNTER — Encounter (HOSPITAL_COMMUNITY): Payer: Self-pay | Admitting: *Deleted

## 2014-01-06 DIAGNOSIS — Z880 Allergy status to penicillin: Secondary | ICD-10-CM | POA: Diagnosis not present

## 2014-01-06 DIAGNOSIS — Z8669 Personal history of other diseases of the nervous system and sense organs: Secondary | ICD-10-CM | POA: Insufficient documentation

## 2014-01-06 DIAGNOSIS — Z8739 Personal history of other diseases of the musculoskeletal system and connective tissue: Secondary | ICD-10-CM | POA: Diagnosis not present

## 2014-01-06 DIAGNOSIS — Z791 Long term (current) use of non-steroidal anti-inflammatories (NSAID): Secondary | ICD-10-CM | POA: Diagnosis not present

## 2014-01-06 DIAGNOSIS — K219 Gastro-esophageal reflux disease without esophagitis: Secondary | ICD-10-CM | POA: Insufficient documentation

## 2014-01-06 DIAGNOSIS — Z7982 Long term (current) use of aspirin: Secondary | ICD-10-CM | POA: Diagnosis not present

## 2014-01-06 DIAGNOSIS — I10 Essential (primary) hypertension: Secondary | ICD-10-CM | POA: Insufficient documentation

## 2014-01-06 DIAGNOSIS — E669 Obesity, unspecified: Secondary | ICD-10-CM | POA: Insufficient documentation

## 2014-01-06 DIAGNOSIS — Z720 Tobacco use: Secondary | ICD-10-CM | POA: Insufficient documentation

## 2014-01-06 DIAGNOSIS — R2241 Localized swelling, mass and lump, right lower limb: Secondary | ICD-10-CM | POA: Diagnosis present

## 2014-01-06 DIAGNOSIS — L89612 Pressure ulcer of right heel, stage 2: Secondary | ICD-10-CM | POA: Insufficient documentation

## 2014-01-06 DIAGNOSIS — Z79899 Other long term (current) drug therapy: Secondary | ICD-10-CM | POA: Insufficient documentation

## 2014-01-06 DIAGNOSIS — R609 Edema, unspecified: Secondary | ICD-10-CM

## 2014-01-06 DIAGNOSIS — Z7952 Long term (current) use of systemic steroids: Secondary | ICD-10-CM | POA: Diagnosis not present

## 2014-01-06 DIAGNOSIS — I509 Heart failure, unspecified: Secondary | ICD-10-CM | POA: Diagnosis not present

## 2014-01-06 DIAGNOSIS — L89892 Pressure ulcer of other site, stage 2: Secondary | ICD-10-CM

## 2014-01-06 LAB — CBC WITH DIFFERENTIAL/PLATELET
BASOS PCT: 0 % (ref 0–1)
Basophils Absolute: 0 10*3/uL (ref 0.0–0.1)
EOS ABS: 0.3 10*3/uL (ref 0.0–0.7)
Eosinophils Relative: 3 % (ref 0–5)
HCT: 44.8 % (ref 39.0–52.0)
Hemoglobin: 14.8 g/dL (ref 13.0–17.0)
Lymphocytes Relative: 31 % (ref 12–46)
Lymphs Abs: 2.5 10*3/uL (ref 0.7–4.0)
MCH: 29.8 pg (ref 26.0–34.0)
MCHC: 33 g/dL (ref 30.0–36.0)
MCV: 90.3 fL (ref 78.0–100.0)
MONOS PCT: 7 % (ref 3–12)
Monocytes Absolute: 0.5 10*3/uL (ref 0.1–1.0)
NEUTROS PCT: 59 % (ref 43–77)
Neutro Abs: 4.8 10*3/uL (ref 1.7–7.7)
PLATELETS: 258 10*3/uL (ref 150–400)
RBC: 4.96 MIL/uL (ref 4.22–5.81)
RDW: 14.4 % (ref 11.5–15.5)
WBC: 8.1 10*3/uL (ref 4.0–10.5)

## 2014-01-06 LAB — I-STAT CHEM 8, ED
BUN: 15 mg/dL (ref 6–23)
Calcium, Ion: 1.1 mmol/L — ABNORMAL LOW (ref 1.12–1.23)
Chloride: 104 meq/L (ref 96–112)
Creatinine, Ser: 1.1 mg/dL (ref 0.50–1.35)
Glucose, Bld: 119 mg/dL — ABNORMAL HIGH (ref 70–99)
HCT: 50 % (ref 39.0–52.0)
Hemoglobin: 17 g/dL (ref 13.0–17.0)
Potassium: 4.2 mmol/L (ref 3.5–5.1)
Sodium: 141 mmol/L (ref 135–145)
TCO2: 22 mmol/L (ref 0–100)

## 2014-01-06 LAB — CBG MONITORING, ED: Glucose-Capillary: 105 mg/dL — ABNORMAL HIGH (ref 70–99)

## 2014-01-06 MED ORDER — CLINDAMYCIN HCL 150 MG PO CAPS: 300.0000 mg | ORAL_CAPSULE | Freq: Three times a day (TID) | ORAL | Status: DC

## 2014-01-06 MED ORDER — HYDROCODONE-ACETAMINOPHEN 5-325 MG PO TABS: 1.0000 | ORAL_TABLET | ORAL | Status: DC | PRN

## 2014-01-06 MED ORDER — HYDROCODONE-ACETAMINOPHEN 5-325 MG PO TABS
1.0000 | ORAL_TABLET | Freq: Once | ORAL | Status: AC
  Administered 2014-01-06: 1 via ORAL
  Filled 2014-01-06: qty 1

## 2014-01-06 NOTE — ED Notes (Signed)
Pt in c/o right foot swelling with rash for the last few days, no relief with home medication, no distress noted

## 2014-01-06 NOTE — ED Provider Notes (Signed)
CSN: [id number]     Arrival date & time 01/06/14  1619 History   First MD Initiated Contact with Patient 01/06/14 2004     Chief Complaint  Patient presents with  . Foot Swelling    (Consider location/radiation/quality/duration/timing/severity/associated sxs/prior Treatment) HPI Comments: Patient is a 58 year old male with history of NICM, CHF, HTN, and GERD who presents to the ED for 4 days of a stabbing, constant, pain in his R 5th toe. He states that the pain has been gradually worsening since onset. He has noted some associated swelling to his R 5th toe as well as purulent drainage. He has had some extra strength Tylenol at home, but has neglected to take anything for pain. He also tried applying some foot powder without relief. Patient attempted to follow up with his PCP today, but could not get an appointment. He denies associated fever, red linear streaking, numbness, or an inability to walk. He denies a hx of diabetes.  The history is provided by the patient. No language interpreter was used.    Past Medical History  Diagnosis Date  . Nonischemic cardiomyopathy     admitted 5/11 w acute systolic CHF. LHC showed no angiographic CAD. Echo (5/11) showed EF 15-20% w diffuse sever hypokinesis, moderate MR> RHC showed PCWP 10, CI 1.47 (Fick.) cause of CMP thought to be heavy ETOH and cocaine. No ICD yet due to ongoing ETOH intake  . ETOH abuse   . Cocaine abuse   . Active smoker /    1/2 ppd  . Obesity   . Hypertension   . CHF (congestive heart failure)   . Gout   . GERD (gastroesophageal reflux disease)   . Sleep apnea   . 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 No past surgeries     Family History  Problem Relation Age of Onset  . Heart disease Mother   . Cancer Mother   . Heart disease Father     History  Substance Use Topics  . Smoking status: Current Every Day Smoker -- 1.00 packs/day for 38 years    Types: Cigarettes  . Smokeless tobacco: Not on file     Comment: was on patches.  Was stopped.  Restarted smoking/ 1/2 PPD  . Alcohol Use: 7.2 oz/week    12 Shots of liquor per week     Comment: Vodka 1/2 pint 3 days a week    Review of Systems  Constitutional: Negative for fever.  Musculoskeletal: Positive for myalgias and joint swelling.  Skin: Negative for pallor.  Neurological: Negative for numbness.  All other systems reviewed and are negative.   Allergies  Lisinopril; Shrimp; and Penicillins  Home Medications   Prior to Admission medications   Medication Sig Start Date End Date Taking? Authorizing Provider  aspirin (ASPIR-LOW) 81 MG EC tablet Take 81 mg by mouth daily.     Historical Provider, MD  carvedilol (COREG) 25 MG tablet Take 1 tablet (25 mg total) by mouth 2 (two) times daily. 10/22/13 10/22/14  Dow Adolph, MD  diphenhydrAMINE (BENADRYL) 25 MG tablet Take 25 mg by mouth every 6 (six) hours as needed for allergies.    Historical Provider, MD  EPINEPHrine (EPIPEN) 0.3 mg/0.3 mL SOAJ injection Inject 0.3 mL (0.3 mg total) into the muscle once as needed (anaphylaxis). 08/24/12   Prashant  Curlene LabrumK Singh, MD  furosemide (LASIX) 40 MG tablet Take 1.5 tablets (60 mg total) by mouth daily. 10/22/13   Dow Adolphichard Kazibwe, MD  hydrocortisone (ANUSOL-HC) 2.5 % rectal cream Place 1 application rectally 2 (two) times daily. 10/22/13   Dow Adolphichard Kazibwe, MD  isosorbide-hydrALAZINE (BIDIL) 20-37.5 MG per tablet Take 1 tablet by mouth 3 (three) times daily. 10/22/13   Dow Adolphichard Kazibwe, MD  Multiple Vitamin (MULTIVITAMIN WITH MINERALS) TABS Take 1 tablet by mouth daily.    Historical Provider, MD  naproxen (NAPROSYN) 500 MG tablet Take 1 tablet (500 mg total) by mouth 2 (two) times daily with a meal. 12/16/13 12/16/14  Jill AlexandersAlexa Richardson, MD  omeprazole (PRILOSEC) 40 MG capsule Take 1  capsule (40 mg total) by mouth daily. 10/22/13   Dow Adolphichard Kazibwe, MD  polyethylene glycol [facility](MIRALAX / GLYCOLAX) packet Take 17 g by mouth daily. 10/22/13   Dow Adolphichard Kazibwe, MD  spironolactone (ALDACTONE) 25 MG tablet Take 1 tablet (25 mg total) by mouth daily. 10/22/13   Dow Adolphichard Kazibwe, MD  terbinafine (LAMISIL) 1 % cream Apply 1 application topically 2 (two) times daily. 12/16/13   Jill AlexandersAlexa Richardson, MD  zolpidem (AMBIEN) 5 MG tablet Take 1 tablet (5 mg total) by mouth at bedtime as needed for sleep. 12/16/13   Alexa Senaida Oresichardson, MD   BP 130/90 mmHg  Pulse 85  Temp(Src) 98.3 F (36.8 C) (Oral)  Resp 18  Ht 5\' 6"  (1.676 m)  Wt 280 lb (127.007 kg)  BMI 45.21 kg/m2  SpO2 97%   Physical Exam  Constitutional: He is oriented to person, place, and time. He appears well-developed and well-nourished. No distress.  Nontoxic/nonseptic appearing  HENT:  Head: Normocephalic and atraumatic.  Eyes: Conjunctivae and EOM are normal. No scleral icterus.  Neck: Normal range of motion.  Cardiovascular: Normal rate, regular rhythm and intact distal pulses.   DP and PT pulses 2+ in RLE  Pulmonary/Chest: Effort normal. No respiratory distress.  Musculoskeletal: Normal range of motion.       Right foot: There is tenderness. There is normal range of motion, no bony tenderness, normal capillary refill and no crepitus.       Feet:  Desquamation of the plantar and medial aspect of the R 5th digit. There is mild associated swelling as well as a mild foul odor. No active purulence or drainage. No weeping or heat to touch. No red linear streaking. Findings c/w stage II pressure ulcer.  Neurological: He is alert and oriented to person, place, and time. He exhibits normal muscle tone. Coordination normal.  Sensation to light touch intact. Patient able to wiggle all toes of R foot.  Skin: Skin is warm and dry. No rash noted. He is not diaphoretic. No erythema. No pallor.  Psychiatric: He has a normal mood and affect.  His behavior is normal.  Nursing note and vitals reviewed.   ED Course  Procedures (including critical care time) Labs Review Labs Reviewed  CBG MONITORING, ED - Abnormal; Notable for the following:    Glucose-Capillary 105 (*)    All other components within normal limits  CBC WITH DIFFERENTIAL  I-STAT CHEM 8, ED    Imaging Review Dg Foot Complete Right  01/06/2014   CLINICAL DATA:  Pain and swelling and right foot for 3 days  EXAM: RIGHT FOOT COMPLETE - 3+ VIEW  COMPARISON:  05/11/2010  FINDINGS: There is no evidence of fracture or dislocation. There is no evidence of arthropathy or other focal bone abnormality. Mild diffuse soft tissue swelling noted.  IMPRESSION: 1. Soft tissue swelling. 2. No fractures.   Electronically Signed   By: Signa Kell M.D.   On: 01/06/2014 18:37     EKG Interpretation None      MDM   Final diagnoses:  Pressure ulcer of toe, stage II    58 year old male presents to the emergency department for further evaluation of right fifth toe pain. Patient neurovascularly intact. Physical exam consistent with pressure ulcer of, approximately, stage II. There is a mild amount of purulence mixed with the foot powder applied by the patient PTA. No red linear streaking or bony exposure. No induration, marked erythema, or heat to touch. No subcutaneous gas or evidence of osteomyelitis noted on Xray. Patient has no leukocytosis or fever on work up. Given his endorsed hx of purulent drainage, however, will cover for early secondary infection with clindamycin. Have advised the use of moleskin as well as elevation to prevent further ulceration. Patient urged to f/u with his PCP for a recheck of his wound. Return precautions provided and patient agreeable to plan with no unaddressed concerns. Patient discharged in good condition; VSS.   Filed Vitals:   01/06/14 1622 01/06/14 1845 01/06/14 2026 01/06/14 2141  BP: 160/103 132/78 130/90 119/68  Pulse: 72 99 85 88  Temp:  98 F (36.7 C) 98.3 F (36.8 C)  98.4 F (36.9 C)  TempSrc: Oral Oral  Oral  Resp: 20 18 18 18   Height: 5\' 6"  (1.676 m)     Weight: 280 lb (127.007 kg)     SpO2: 100% 96% 97% 95%      Antony Madura, PA-C 01/06/14 2153  Loren Racer, MD 01/06/14 2340

## 2014-01-06 NOTE — ED Notes (Signed)
Pt. Refused wheelchair and left with all belongings 

## 2014-01-06 NOTE — Discharge Instructions (Signed)
Take Clindamycin as prescribed. You may take Norco as needed for pain control if Tylenol does not control your pain. Do not take Norco in combination with Tylenol. Follow up with your primary care doctor for a recheck of your pressure ulcer. Keep the area clean and dry. Try to relieve pressure on the area by elevating your foot and by using Moleskin. This can be found at your local pharmacy. Return to the ED if symptoms worsen.  Pressure Ulcer A pressure ulcer is a sore that has formed from the breakdown of skin and exposure of deeper layers of tissue. It develops in areas of the body where there is unrelieved pressure. Pressure ulcers are usually found over a bony area, such as the shoulder blades, spine, lower back, hips, knees, ankles, and heels. Pressure ulcers vary in severity. Your health care provider may determine the severity (stage) of your pressure ulcer. The stages include:  Stage I--The skin is red, and when the skin is pressed, it stays red.  Stage II--The top layer of skin is gone, and there is a shallow, pink ulcer.  Stage III--The ulcer becomes deeper, and it is more difficult to see the whole wound. Also, there may be yellow or brown parts, as well as pink and red parts.  Stage IV--The ulcer may be deep and red, pink, brown, white, or yellow. Bone or muscle may be seen.  Unstageable pressure ulcer--The ulcer is covered almost completely with black, brown, or yellow tissue. It is not known how deep the ulcer is or what stage it is until this covering comes off.  Suspected deep tissue injury--A person's skin can be injured from pressure or pulling on the skin when his or her position is changed. The skin appears purple or maroon. There may not be an opening in the skin, but there could be a blood-filled blister. This deep tissue injury is often difficult to see in people with darker skin tones. The site may open and become deeper in time. However, early interventions will help the  area heal and may prevent the area from opening. CAUSES  Pressure ulcers are caused by pressure against the skin that limits the flow of blood to the skin and nearby tissues. There are many risk factors that can lead to pressure sores. RISK FACTORS  Decreased ability to move.  Decreased ability to feel pain or discomfort.  Excessive skin moisture from urine, stool, sweat, or secretions.  Poor nutrition.  Dehydration.  Tobacco, drug, or alcohol abuse.  Having someone pull on bedsheets that are under you, such as when health care workers are changing your position in a hospital bed.  Obesity.  Increased adult age.  Hospitalization in a critical care unit for longer than 4 days with use of medical devices.  Prolonged use of medical devices.  Critical illness.  Anemia.  Traumatic brain injury.  Spinal cord injury.  Stroke.  Diabetes.  Poor blood glucose control.  Low blood pressure (hypotension).  Low oxygen levels.  Medicines that reduce blood flow.  Infection. DIAGNOSIS  Your health care provider will diagnose your pressure ulcer based on its appearance. The health care provider may determine the stage of your pressure ulcer as well. Tests may be done to check for infection, to assess your circulation, or to check for other diseases, such as diabetes. TREATMENT  Treatment of your pressure ulcer begins with determining what stage the ulcer is in. Your treatment team may include your health care provider, a wound care specialist,  a nutritionist, a physical therapist, and a Careers adviser. Possible treatments may include:   Moving or repositioning every 1-2 hours.  Using beds or mattresses to shift your body weight and pressure points frequently.  Improving your diet.  Cleaning and bandaging (dressing) the open wound.  Giving antibiotic medicines.  Removing damaged tissue.  Surgery and sometimes skin grafts. HOME CARE INSTRUCTIONS  If you were hospitalized,  follow the care plan that was started in the hospital.  Avoid staying in the same position for more than 2 hours. Use padding, devices, or mattresses to cushion your pressure points as directed by your health care provider.  Eat a well-balanced diet. Take nutritional supplements and vitamins as directed by your health care provider.  Keep all follow-up appointments.  Only take over-the-counter or prescription medicines for pain, fever, or discomfort as directed by your health care provider. SEEK MEDICAL CARE IF:   Your pressure ulcer is not improving.  You do not know how to care for your pressure ulcer.  You notice other areas of redness on your skin.  You have a fever. SEEK IMMEDIATE MEDICAL CARE IF:   You have increasing redness, swelling, or pain in your pressure ulcer.  You notice pus coming from your pressure ulcer.  You notice a bad smell coming from the wound or dressing.  Your pressure ulcer opens up again. Document Released: 12/26/2004 Document Revised: 12/31/2012 Document Reviewed: 09/02/2012 [facility] Patient Information 2015 [place], [place]. This information is not intended to replace advice given to you by your health care provider. Make sure you discuss any questions you have with your health care provider.

## 2014-01-06 NOTE — ED Notes (Signed)
CBG 105 

## 2014-01-21 ENCOUNTER — Ambulatory Visit: Payer: Medicaid Other | Admitting: Internal Medicine

## 2014-02-11 ENCOUNTER — Other Ambulatory Visit: Payer: Self-pay | Admitting: Internal Medicine

## 2014-02-27 ENCOUNTER — Other Ambulatory Visit: Payer: Self-pay | Admitting: Cardiology

## 2014-02-27 ENCOUNTER — Other Ambulatory Visit: Payer: Self-pay | Admitting: Internal Medicine

## 2014-03-21 ENCOUNTER — Other Ambulatory Visit: Payer: Self-pay | Admitting: Internal Medicine

## 2014-03-23 ENCOUNTER — Other Ambulatory Visit: Payer: Self-pay | Admitting: Internal Medicine

## 2014-04-20 ENCOUNTER — Other Ambulatory Visit: Payer: Self-pay | Admitting: Internal Medicine

## 2014-04-20 ENCOUNTER — Other Ambulatory Visit: Payer: Self-pay | Admitting: Cardiology

## 2014-05-04 ENCOUNTER — Other Ambulatory Visit: Payer: Self-pay | Admitting: Internal Medicine

## 2014-05-18 ENCOUNTER — Other Ambulatory Visit: Payer: Self-pay | Admitting: Internal Medicine

## 2014-05-22 NOTE — Telephone Encounter (Signed)
Patient carries a diagnosis of severe sleep apnea and it is unclear to what extent he is using the nocturnal CPAP therapy.  Thus, any sleep aide could theoretically worsen sleep apnea if CPAP is not being used.  In addition, he no showed his follow-up appointment in January.  I feel he requires reassessment in the clinic before pharmacotherapy is prescribed for his insomnia, given the above.  Dr. Zada Girt returns on Monday so I will not discontinue the medication, but defer to him if he feels strongly it should be prescribed since he knows the patient (I do not).

## 2014-05-25 ENCOUNTER — Encounter: Payer: Self-pay | Admitting: *Deleted

## 2014-05-27 ENCOUNTER — Other Ambulatory Visit: Payer: Self-pay | Admitting: Internal Medicine

## 2014-06-10 ENCOUNTER — Other Ambulatory Visit: Payer: Self-pay | Admitting: Internal Medicine

## 2014-07-15 ENCOUNTER — Other Ambulatory Visit: Payer: Self-pay | Admitting: Internal Medicine

## 2014-07-21 ENCOUNTER — Ambulatory Visit (HOSPITAL_COMMUNITY)
Admission: RE | Admit: 2014-07-21 | Discharge: 2014-07-21 | Disposition: A | Discharge status: Home / Self Care | Payer: Medicaid Other | Source: Ambulatory Visit | Attending: Internal Medicine | Admitting: Internal Medicine

## 2014-07-21 ENCOUNTER — Ambulatory Visit (INDEPENDENT_AMBULATORY_CARE_PROVIDER_SITE_OTHER): Payer: Medicaid Other | Admitting: Internal Medicine

## 2014-07-21 ENCOUNTER — Encounter: Payer: Self-pay | Admitting: Internal Medicine

## 2014-07-21 DIAGNOSIS — I509 Heart failure, unspecified: Secondary | ICD-10-CM | POA: Diagnosis not present

## 2014-07-21 DIAGNOSIS — R7303 Prediabetes: Secondary | ICD-10-CM | POA: Insufficient documentation

## 2014-07-21 DIAGNOSIS — I10 Essential (primary) hypertension: Secondary | ICD-10-CM

## 2014-07-21 DIAGNOSIS — G4700 Insomnia, unspecified: Secondary | ICD-10-CM

## 2014-07-21 DIAGNOSIS — R739 Hyperglycemia, unspecified: Secondary | ICD-10-CM

## 2014-07-21 DIAGNOSIS — F17210 Nicotine dependence, cigarettes, uncomplicated: Secondary | ICD-10-CM

## 2014-07-21 DIAGNOSIS — F1010 Alcohol abuse, uncomplicated: Secondary | ICD-10-CM

## 2014-07-21 DIAGNOSIS — I5020 Unspecified systolic (congestive) heart failure: Secondary | ICD-10-CM | POA: Diagnosis not present

## 2014-07-21 DIAGNOSIS — Z6841 Body Mass Index (BMI) 40.0 and over, adult: Secondary | ICD-10-CM | POA: Diagnosis not present

## 2014-07-21 DIAGNOSIS — R0600 Dyspnea, unspecified: Secondary | ICD-10-CM | POA: Insufficient documentation

## 2014-07-21 DIAGNOSIS — E785 Hyperlipidemia, unspecified: Secondary | ICD-10-CM | POA: Insufficient documentation

## 2014-07-21 DIAGNOSIS — K219 Gastro-esophageal reflux disease without esophagitis: Secondary | ICD-10-CM | POA: Diagnosis not present

## 2014-07-21 DIAGNOSIS — M542 Cervicalgia: Secondary | ICD-10-CM | POA: Insufficient documentation

## 2014-07-21 DIAGNOSIS — R0602 Shortness of breath: Secondary | ICD-10-CM | POA: Diagnosis present

## 2014-07-21 DIAGNOSIS — Z299 Encounter for prophylactic measures, unspecified: Secondary | ICD-10-CM

## 2014-07-21 DIAGNOSIS — F17200 Nicotine dependence, unspecified, uncomplicated: Secondary | ICD-10-CM

## 2014-07-21 DIAGNOSIS — I5022 Chronic systolic (congestive) heart failure: Secondary | ICD-10-CM

## 2014-07-21 LAB — COMPLETE METABOLIC PANEL WITH GFR
ALK PHOS: 94 U/L (ref 39–117)
ALT: 31 U/L (ref 0–53)
AST: 24 U/L (ref 0–37)
Albumin: 3.7 g/dL (ref 3.5–5.2)
BILIRUBIN TOTAL: 0.6 mg/dL (ref 0.2–1.2)
BUN: 11 mg/dL (ref 6–23)
CHLORIDE: 103 meq/L (ref 96–112)
CO2: 27 mEq/L (ref 19–32)
Calcium: 9.2 mg/dL (ref 8.4–10.5)
Creat: 0.92 mg/dL (ref 0.50–1.35)
GFR, Est African American: >89 mL/min
GFR, Est Non African American: >89 mL/min
GLUCOSE: 132 mg/dL — AB (ref 70–99)
Potassium: 4.3 mEq/L (ref 3.5–5.3)
Sodium: 139 mEq/L (ref 135–145)
TOTAL PROTEIN: 6.5 g/dL (ref 6.0–8.3)

## 2014-07-21 LAB — LIPID PANEL
Cholesterol: 132 mg/dL (ref 0–200)
HDL: 17 mg/dL — ABNORMAL LOW (ref >=40)
Total CHOL/HDL Ratio: 7.8 Ratio
Triglycerides: 479 mg/dL — ABNORMAL HIGH (ref <150)

## 2014-07-21 LAB — POCT GLYCOSYLATED HEMOGLOBIN (HGB A1C): Hemoglobin A1C: 7

## 2014-07-21 LAB — GLUCOSE, CAPILLARY: Glucose-Capillary: 157 mg/dL — ABNORMAL HIGH (ref 65–99)

## 2014-07-21 MED ORDER — NICOTINE 7 MG/24HR TD PT24
7.0000 mg | MEDICATED_PATCH | TRANSDERMAL | Status: DC
Start: 2014-07-21 — End: 2015-03-09

## 2014-07-21 MED ORDER — OMEPRAZOLE 40 MG PO CPDR: 40.0000 mg | DELAYED_RELEASE_CAPSULE | Freq: Two times a day (BID) | ORAL | Status: DC

## 2014-07-21 MED ORDER — NICOTINE 14 MG/24HR TD PT24: 14.0000 mg | MEDICATED_PATCH | TRANSDERMAL | Status: DC

## 2014-07-21 MED ORDER — NICOTINE 21 MG/24HR TD PT24: 21.0000 mg | MEDICATED_PATCH | TRANSDERMAL | Status: DC

## 2014-07-21 NOTE — Assessment & Plan Note (Signed)
Pt experiences heartburn and pain all over his neck and upper chest for about 3-4 weeks now. He takes prilosec 40 mg once a day and it does not seem to help. No associated nausea, vomiting, no hemoptysis ,  No chest pain,     Plan We did EKG to rule out cardiac etiology, and it was overall unchanged and unremarkable. TOld him to increase prilosec short term to twice a day, and see me in 2 weeks for further management and to discuss his results  Also told him to cut back on smoking and prescribed nicotine patches as smoking is associated with GERD Also told him to stop or cut down alcohol use, especially hard liquor.

## 2014-07-21 NOTE — Patient Instructions (Addendum)
Thank you for your visit today  Please follow up in 2 weeks to discuss the bloodwork results.  Please have your lung capacity test done and x-ray  Please cut back on smoking, and alcohol use  Please have a quit date. Once you quit- apply 21 mg patch for 7 days, then 14 mg patch daily for next 7 days then 7 mg patch for the final 7 days  Please follow up with your cardiologist   Please take 2 pills of omeprazole per day, instead of one.

## 2014-07-21 NOTE — Assessment & Plan Note (Addendum)
Advised him to take care of feet, and to not walk on bare feet as he has dry skin all over the feet Told him to use lotion   Pt had colonoscopy in 2014 and the report stated it was by large unremarkable , and f/u in 10 years

## 2014-07-21 NOTE — Assessment & Plan Note (Signed)
Pt takes ambien 5 mg. Advised him to take only if needed and not make it a habit

## 2014-07-21 NOTE — Assessment & Plan Note (Addendum)
Advised him to eat healthy diet and weight management Pt not interested in dietician referral to Ms Lupita Leash   Also advised him to use CPAP

## 2014-07-21 NOTE — Assessment & Plan Note (Signed)
Pt not interested in quitting, but nicotine patches prescribed if he wants to quits

## 2014-07-21 NOTE — Progress Notes (Signed)
Patient ID: Ethan Wilcox, male   DOB: 11-20-1955, 59 y.o.   MRN: [id number]   Subjective:   Patient ID: Ethan Wilcox male   DOB: 12/17/1955 59 y.o.   MRN: [id number]  HPI: Ethan Wilcox is a 59 y.o. man who is here for 6 month follow up. His chief complaint today is GERD.   He has notable PMH of HTN, systolic HF, OSA, GERD, OSA, tobacco use, and heartburn, and morbid obesity.      Past Medical History  Diagnosis Date  . Nonischemic cardiomyopathy     admitted 5/11 w acute systolic CHF. LHC showed no angiographic CAD. Echo (5/11) showed EF 15-20% w diffuse sever hypokinesis, moderate MR> RHC showed PCWP 10, CI 1.47 (Fick.) cause of CMP thought to be heavy ETOH and cocaine. No ICD yet due to ongoing ETOH intake  . ETOH abuse   . Cocaine abuse   . Active smoker /    1/2 ppd  . Obesity   . Hypertension   . CHF (congestive heart failure)   . Gout   . GERD (gastroesophageal reflux disease)   . Sleep apnea   . 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 aspirin (ASPIR-LOW) 81 MG EC tablet Take 81 mg by mouth daily.     [name] BIDIL 20-37.5 MG per tablet TAKE 1 TABLET BY MOUTH THREE TIMES DAILY 90 tablet 2  . carvedilol (COREG) 25 MG tablet TAKE 1 TABLET BY MOUTH TWICE DAILY 180 tablet 0  . diphenhydrAMINE (BENADRYL) 25 MG tablet Take 25 mg by mouth every 6 (six) hours as needed for allergies.    . furosemide (LASIX) 40 MG tablet TAKE 1.5 TABLETS BY MOUTH DAILY 270 tablet 0  . losartan (COZAAR) 50 MG tablet TAKE 1 1/2 TABLET BY MOUTH EVERY DAY 45 tablet 0  . Multiple Vitamin (MULTIVITAMIN WITH MINERALS) TABS Take 1 tablet by mouth daily.    [name] omeprazole (PRILOSEC) 40 MG capsule Take 1 capsule (40 mg total) by mouth 2 (two) times daily. 90 capsule 1  . polyethylene glycol powder (GLYCOLAX/MIRALAX)  powder DISSOLVE 1 CAPFUL BY MOUTH DAILY AS DIRECTED 255 g 6  . spironolactone (ALDACTONE) 25 MG tablet TAKE 1 TABLET BY MOUTH DAILY. 90 tablet 5  . zolpidem (AMBIEN) 5 MG tablet Take 1 tablet (5 mg total) by mouth at bedtime as needed for sleep. 30 tablet 3  . clindamycin (CLEOCIN) 150 MG capsule Take 2 capsules (300 mg total) by mouth 3 (three) times daily. May dispense as 150mg  capsules (Patient not taking: Reported on 07/21/2014) 60 capsule 0  . EPINEPHrine (EPIPEN) 0.3 mg/0.3 mL SOAJ injection Inject 0.3 mL (0.3 mg total) into the muscle once as needed (anaphylaxis). (Patient not taking: Reported on 07/21/2014) 1 Device 1  . HYDROcodone-acetaminophen (NORCO/VICODIN) 5-325 MG per tablet Take 1 tablet by mouth every 4 (four) hours as needed for moderate pain. (Patient not taking: Reported on 07/21/2014) 7 tablet 0  . hydrocortisone (ANUSOL-HC) 2.5 % rectal cream Place 1 application rectally 2 (two) times daily. (Patient not taking: Reported on 07/21/2014) 30 g 1  . naproxen (NAPROSYN) 500 MG tablet Take 1 tablet (500 mg total) by mouth 2 (two) times daily with a meal. (Patient not taking: Reported on 07/21/2014) 60 tablet 0  . [START ON 07/28/2014] nicotine (NICODERM CQ -  DOSED IN MG/24 HOURS) 14 mg/24hr patch Place 1 patch (14 mg total) onto the skin daily. 7 patch 0  . nicotine (NICODERM CQ - DOSED IN MG/24 HOURS) 21 mg/24hr patch Place 1 patch (21 mg total) onto the skin daily. 7 patch 0  . nicotine (NICODERM CQ - DOSED IN MG/24 HR) 7 mg/24hr patch Place 1 patch (7 mg total) onto the skin daily. 7 patch 0  . terbinafine (LAMISIL) 1 % cream Apply 1 application topically 2 (two) times daily. (Patient not taking: Reported on 07/21/2014) 30 g 0   No current facility-administered medications for this visit.   Family History  Problem Relation Age of Onset  . Heart disease Mother   . Cancer Mother   . Heart disease Father    History   Social History  . Marital Status: Single    Spouse Name: N/A  .  Number of Children: N/A  . Years of Education: N/A   Occupational History  . DISABLED    Social History Main Topics  . Smoking status: Current Every Day Smoker -- 1.00 packs/day for 38 years    Types: Cigarettes  . Smokeless tobacco: Not on file     Comment: was on patches.  Was stopped.  Restarted smoking/ 1/2 PPD  . Alcohol Use: 7.2 oz/week    12 Shots of liquor per week     Comment: Vodka 1/2 pint 3 days a week  . Drug Use: Yes    Special: Cocaine     Comment: former  . Sexual Activity: Not on file   Other Topics Concern  . Not on file   Social History Narrative   Works as Gaffer. 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 Heavy ETOH in past, now back to drinking 1/2 pint/week.          No show 07/16/09   Review of Systems:  Review of Systems  Constitutional: Negative.   HENT: Negative.   Respiratory: Positive for shortness of breath and wheezing. Negative for cough and hemoptysis.   Cardiovascular: Negative for chest pain, palpitations, orthopnea, claudication and leg swelling.  Gastrointestinal: Positive for heartburn. Negative for nausea, vomiting, abdominal pain, diarrhea, constipation and blood in stool.  Musculoskeletal: Positive for joint pain.  Neurological: Negative for dizziness and loss of consciousness.  Endo/Heme/Allergies: Negative for polydipsia.  Psychiatric/Behavioral: Negative.      Objective:  Physical Exam: Filed Vitals:   07/21/14 0957  BP: 118/81  Pulse: 89  Temp: 98.1 F (36.7 C)  TempSrc: Oral  Height:  (1.702 m)  Weight: 282 lb 9.6 oz (128.187 kg)  SpO2: 100%   Physical Exam  Constitutional: He is oriented to person, place, and time. He appears well-developed and well-nourished.  obese  Eyes: EOM are normal.  Neck: Normal range of motion. Neck supple. No thyromegaly present.  Cardiovascular: Normal rate, regular rhythm and intact distal pulses.   No murmur heard. Pulmonary/Chest: Effort normal and breath sounds normal. He has no  wheezes. He exhibits no tenderness.  Abdominal: Soft. Bowel sounds are normal. He exhibits no distension. There is no tenderness.  Musculoskeletal: He exhibits no edema.  Neurological: He is alert and oriented to person, place, and time. No cranial nerve deficit.  Extremities: no clubbing, cyanosis, or edema    Assessment & Plan:   Please see problem based chart for A&P

## 2014-07-21 NOTE — Assessment & Plan Note (Signed)
Well controlled- today 118/81 Continue current regimen of lasix, BB, spironolactone, and bidil -Ordered CMET today to assess renal function  BP Readings from Last 3 Encounters:  07/21/14 118/81  01/06/14 119/68  12/16/13 110/62    Lab Results  Component Value Date   NA 141 01/06/2014   K 4.2 01/06/2014   CREATININE 1.10 01/06/2014

## 2014-07-21 NOTE — Assessment & Plan Note (Signed)
He follows Dr Shirlee Latch and has not followed up in a while  Encouraged to follow up with him.

## 2014-07-22 LAB — LDL CHOLESTEROL, DIRECT: Direct LDL: 35 mg/dL

## 2014-07-22 NOTE — Addendum Note (Signed)
Addended by: [name] A on: 07/22/2014 09:19 AM   Modules accepted: Orders

## 2014-07-22 NOTE — Addendum Note (Signed)
Addended by: [name] A on: 07/22/2014 11:41 AM   Modules accepted: Orders

## 2014-07-22 NOTE — Addendum Note (Signed)
Addended by: [name] A on: 07/22/2014 03:47 PM   Modules accepted: Orders

## 2014-07-23 ENCOUNTER — Inpatient Hospital Stay (HOSPITAL_COMMUNITY): Admission: RE | Admit: 2014-07-23 | Payer: Medicaid Other | Source: Ambulatory Visit

## 2014-07-24 NOTE — Progress Notes (Signed)
Internal Medicine Clinic Attending  I saw and evaluated the patient.  I personally confirmed the key portions of the history and exam documented by Dr. Saraiya and I reviewed pertinent patient test results.  The assessment, diagnosis, and plan were formulated together and I agree with the documentation in the resident's note.  

## 2014-07-30 ENCOUNTER — Encounter: Payer: Self-pay | Admitting: *Deleted

## 2014-08-04 ENCOUNTER — Encounter: Payer: Self-pay | Admitting: Internal Medicine

## 2014-08-04 ENCOUNTER — Ambulatory Visit: Payer: Medicaid Other | Admitting: Internal Medicine

## 2014-08-21 ENCOUNTER — Other Ambulatory Visit: Payer: Self-pay | Admitting: Internal Medicine

## 2014-08-21 MED ORDER — DOCUSATE SODIUM 50 MG PO CAPS: 50.0000 mg | ORAL_CAPSULE | Freq: Two times a day (BID) | ORAL | Status: DC

## 2014-08-21 NOTE — Telephone Encounter (Signed)
Has taken in the past, pt is requesting this be refilled, please advise

## 2014-08-21 NOTE — Telephone Encounter (Signed)
Pt called requesting stool softer to be filled @ [facility].

## 2014-08-31 ENCOUNTER — Telehealth: Payer: Self-pay | Admitting: *Deleted

## 2014-08-31 NOTE — Telephone Encounter (Signed)
PATIENT WAS CONTACTED IN REGARDS TO HIM CONTACTING [facility] BILLING OFFICE [phone]). UNABLE TO SCHEDULE AT THIS TIME. PATIENT STATES HE NEVER GOT THE LETTER THAT WAS SENT TO HIM IN July.

## 2014-09-09 ENCOUNTER — Ambulatory Visit: Payer: Medicaid Other | Admitting: Cardiology

## 2014-09-15 ENCOUNTER — Telehealth: Payer: Self-pay | Admitting: *Deleted

## 2014-09-15 NOTE — Telephone Encounter (Signed)
CALLED PATIENT, LM FOR PATIENT TO CALL [facility] REGARDING HIS GI REFERRAL. PATIENT WAS TO CALL [facility] TO MAKE APPOINTMENT. I SPOKE WITH PATIENT IN [place]. THIS IS A FOLLOW UP CALL.

## 2014-09-30 ENCOUNTER — Ambulatory Visit: Payer: Medicaid Other | Admitting: Nurse Practitioner

## 2014-10-08 ENCOUNTER — Other Ambulatory Visit: Payer: Self-pay | Admitting: Internal Medicine

## 2014-10-08 MED ORDER — OMEPRAZOLE 40 MG PO CPDR: 40.0000 mg | DELAYED_RELEASE_CAPSULE | Freq: Two times a day (BID) | ORAL | Status: DC

## 2014-10-08 NOTE — Telephone Encounter (Signed)
Pt called requesting acid reflux to to be filled @ [facility].

## 2014-10-29 ENCOUNTER — Ambulatory Visit: Payer: Medicaid Other | Admitting: Internal Medicine

## 2014-10-30 ENCOUNTER — Telehealth: Payer: Self-pay

## 2014-10-30 NOTE — Telephone Encounter (Signed)
Prior auth obtained for Losartan 50mg  1 1/2 tabs daily, from [facility]. PA # [id number]. Good for one year.

## 2014-11-05 ENCOUNTER — Ambulatory Visit: Payer: Medicaid Other | Admitting: Internal Medicine

## 2014-11-30 ENCOUNTER — Other Ambulatory Visit: Payer: Self-pay

## 2014-11-30 ENCOUNTER — Other Ambulatory Visit: Payer: Self-pay | Admitting: Internal Medicine

## 2014-11-30 MED ORDER — CARVEDILOL 25 MG PO TABS: 25.0000 mg | ORAL_TABLET | Freq: Two times a day (BID) | ORAL | Status: DC

## 2014-11-30 MED ORDER — OMEPRAZOLE 40 MG PO CPDR: 40.0000 mg | DELAYED_RELEASE_CAPSULE | Freq: Two times a day (BID) | ORAL | Status: DC

## 2014-11-30 NOTE — Telephone Encounter (Signed)
Pt requesting carvedilol, omeprazole and furosemide to be filled @ [facility].

## 2014-12-08 ENCOUNTER — Telehealth: Payer: Self-pay | Admitting: *Deleted

## 2014-12-08 NOTE — Telephone Encounter (Signed)
Pt called clinic - past 2-3 months left arm aches - unable to hold anything in hand. Past 2-3 days bright red rectal bleeding only with wiping. Denies constipation. Offered to go to ER - pt prefers an appt and aware no open appts in clinic today. Appt made 12/09/14 2:15PM Dr Mikey Bussing. If any changes - needs to go to ER. [facility] Aylla Huffine RN 12/08/14 10:45AM

## 2014-12-09 ENCOUNTER — Ambulatory Visit (INDEPENDENT_AMBULATORY_CARE_PROVIDER_SITE_OTHER): Payer: Medicaid Other | Admitting: Internal Medicine

## 2014-12-09 ENCOUNTER — Encounter: Payer: Self-pay | Admitting: Internal Medicine

## 2014-12-09 VITALS — BP 103/76 | HR 106 | Temp 98.2°F | Wt 270.5 lb

## 2014-12-09 DIAGNOSIS — I10 Essential (primary) hypertension: Secondary | ICD-10-CM

## 2014-12-09 DIAGNOSIS — M19012 Primary osteoarthritis, left shoulder: Secondary | ICD-10-CM | POA: Diagnosis not present

## 2014-12-09 DIAGNOSIS — Z7982 Long term (current) use of aspirin: Secondary | ICD-10-CM | POA: Diagnosis not present

## 2014-12-09 DIAGNOSIS — I5022 Chronic systolic (congestive) heart failure: Secondary | ICD-10-CM | POA: Diagnosis not present

## 2014-12-09 DIAGNOSIS — K5900 Constipation, unspecified: Secondary | ICD-10-CM | POA: Diagnosis not present

## 2014-12-09 DIAGNOSIS — R739 Hyperglycemia, unspecified: Secondary | ICD-10-CM

## 2014-12-09 DIAGNOSIS — Z79899 Other long term (current) drug therapy: Secondary | ICD-10-CM | POA: Diagnosis not present

## 2014-12-09 DIAGNOSIS — I110 Hypertensive heart disease with heart failure: Secondary | ICD-10-CM

## 2014-12-09 DIAGNOSIS — R7303 Prediabetes: Secondary | ICD-10-CM

## 2014-12-09 LAB — GLUCOSE, CAPILLARY: GLUCOSE-CAPILLARY: 127 mg/dL — AB (ref 65–99)

## 2014-12-09 LAB — POCT GLYCOSYLATED HEMOGLOBIN (HGB A1C): HEMOGLOBIN A1C: 6.2

## 2014-12-09 MED ORDER — FUROSEMIDE 40 MG PO TABS: 60.0000 mg | ORAL_TABLET | Freq: Every day | ORAL | Status: DC

## 2014-12-09 MED ORDER — DOCUSATE SODIUM 100 MG PO CAPS: 100.0000 mg | ORAL_CAPSULE | Freq: Two times a day (BID) | ORAL | Status: DC

## 2014-12-09 MED ORDER — NAPROXEN 500 MG PO TABS: 500.0000 mg | ORAL_TABLET | Freq: Two times a day (BID) | ORAL | Status: DC

## 2014-12-09 MED ORDER — POLYETHYLENE GLYCOL 3350 17 GM/SCOOP PO POWD: ORAL | Status: DC

## 2014-12-09 NOTE — Patient Instructions (Signed)
General Instructions:  I want you to try a 2 week trial of Naproxen for your shoulder pain  Please try to bring all your medicines next time. This will help [place] keep you safe from mistakes.   Progress Toward Treatment Goals:  Treatment Goal 07/30/2013  Blood pressure unchanged  Stop smoking smoking the same amount    Self Care Goals & Plans:  Self Care Goal 12/09/2014  Manage my medications take my medicines as prescribed; bring my medications to every visit; refill my medications on time  Monitor my health keep track of my blood pressure  Eat healthy foods eat more vegetables; eat foods that are low in salt; eat baked foods instead of fried foods  Be physically active find an activity I enjoy  Stop smoking cut down the number of cigarettes smoked    No flowsheet data found.   Care Management & Community Referrals:  Referral 07/30/2013  Referrals made for care management support health educator

## 2014-12-09 NOTE — Progress Notes (Signed)
[facility] Subjective:   Patient ID: Ethan Wilcox male   DOB: 1955-01-22 59 y.o.   MRN: [id number]  HPI: Ethan Wilcox is a 59 y.o. male with a PMH detailed below who presents for medication refills for constipation.  Please see A&P below for the status of his chronic medical problems.    Past Medical History  Diagnosis Date  . Nonischemic cardiomyopathy (HCC)     admitted 5/11 w acute systolic CHF. LHC showed no angiographic CAD. Echo (5/11) showed EF 15-20% w diffuse sever hypokinesis, moderate MR> RHC showed PCWP 10, CI 1.47 (Fick.) cause of CMP thought to be heavy ETOH and cocaine. No ICD yet due to ongoing ETOH intake  . ETOH abuse   . Cocaine abuse   . Active smoker /    1/2 ppd  . Obesity   . Hypertension   . CHF (congestive heart failure) (HCC)   . Gout   . GERD (gastroesophageal reflux disease)   . Sleep apnea   . 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 aspirin (ASPIR-LOW) 81 MG EC tablet Take 81 mg by mouth daily.     [name] BIDIL 20-37.5 MG per tablet TAKE 1 TABLET BY MOUTH THREE TIMES DAILY 90 tablet 2  . carvedilol (COREG) 25 MG tablet Take 1 tablet (25 mg total) by mouth 2 (two) times daily. 180 tablet 0  . clindamycin (CLEOCIN) 150 MG capsule Take 2 capsules (300 mg total) by mouth 3 (three) times daily. May dispense as  capsules (Patient not taking: Reported on 07/21/2014) 60 capsule 0  . diphenhydrAMINE (BENADRYL) 25 MG tablet Take 25 mg by mouth every 6 (six) hours as needed for allergies.    [name] docusate sodium (COLACE) 50 MG capsule Take 1 capsule (50 mg total) by mouth 2 (two) times daily. 10 capsule 0  . EPINEPHrine (EPIPEN) 0.3 mg/0.3 mL SOAJ injection Inject 0.3 mL (0.3 mg total) into the muscle once as needed (anaphylaxis). (Patient not  taking: Reported on 07/21/2014) 1 Device 1  . furosemide (LASIX) 40 MG tablet TAKE 1.5 TABLETS BY MOUTH DAILY 270 tablet 0  . HYDROcodone-acetaminophen (NORCO/VICODIN) 5-325 MG per tablet Take 1 tablet by mouth every 4 (four) hours as needed for moderate pain. (Patient not taking: Reported on 07/21/2014) 7 tablet 0  . hydrocortisone (ANUSOL-HC) 2.5 % rectal cream Place 1 application rectally 2 (two) times daily. (Patient not taking: Reported on 07/21/2014) 30 g 1  . losartan (COZAAR) 50 MG tablet TAKE 1 1/2 TABLET BY MOUTH EVERY DAY 45 tablet 0  . Multiple Vitamin (MULTIVITAMIN WITH MINERALS) TABS Take 1 tablet by mouth daily.    . naproxen (NAPROSYN) 500 MG tablet Take 1 tablet (500 mg total) by mouth 2 (two) times daily with a meal. (Patient not taking: Reported on 07/21/2014) 60 tablet 0  . nicotine (NICODERM CQ - DOSED IN MG/24 HOURS) 14 mg/24hr patch Place 1 patch (14 mg total) onto the skin daily. 7 patch 0  . nicotine (NICODERM CQ - DOSED IN MG/24 HOURS) 21 mg/24hr patch Place 1 patch (21 mg total) onto the skin daily. 7 patch 0  . nicotine (NICODERM CQ - DOSED IN MG/24 HR) 7 mg/24hr patch Place 1 patch (7 mg total) onto the skin daily. 7 patch 0  . omeprazole (PRILOSEC)  40 MG capsule Take 1 capsule (40 mg total) by mouth 2 (two) times daily. 90 capsule 1  . polyethylene glycol powder (GLYCOLAX/MIRALAX) powder DISSOLVE 1 CAPFUL BY MOUTH DAILY AS DIRECTED 255 g 6  . spironolactone (ALDACTONE) 25 MG tablet TAKE 1 TABLET BY MOUTH DAILY. 90 tablet 5  . terbinafine (LAMISIL) 1 % cream Apply 1 application topically 2 (two) times daily. (Patient not taking: Reported on 07/21/2014) 30 g 0  . zolpidem (AMBIEN) 5 MG tablet Take 1 tablet (5 mg total) by mouth at bedtime as needed for sleep. 30 tablet 3   No current facility-administered medications for this visit.   Family History  Problem Relation Age of Onset  . Heart disease Mother   . Cancer Mother   . Heart disease Father    Social History    Social History  . Marital Status: Single    Spouse Name: N/A  . Number of Children: N/A  . Years of Education: N/A   Occupational History  . DISABLED    Social History Main Topics  . Smoking status: Current Every Day Smoker -- 1.00 packs/day for 38 years    Types: Cigarettes  . Smokeless tobacco: None  . Alcohol Use: 7.2 oz/week    12 Shots of liquor per week     Comment: Vodka 1/2 pint - on/off.  . Drug Use: No     Comment: former  . Sexual Activity: Not Asked   Other Topics Concern  . None   Social History Narrative   Works as Gaffer. 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 Heavy ETOH in past, now back to drinking 1/2 pint/week.          No show 07/16/09   Review of Systems: Review of Systems  Constitutional: Negative for fever.  Respiratory: Negative for shortness of breath.   Cardiovascular: Negative for chest pain, orthopnea and leg swelling.  Gastrointestinal: Positive for constipation and blood in stool. Negative for heartburn, nausea, vomiting, abdominal pain and melena.  Musculoskeletal: Positive for joint pain.  Neurological: Negative for headaches.     Objective:  Physical Exam: Filed Vitals:   12/09/14 1435  BP: 103/76  Pulse: 106  Temp: 98.2 F (36.8 C)  TempSrc: Oral  Weight: 270 lb 8 oz (122.698 kg)  SpO2: 99%  Physical Exam  Constitutional: He is well-developed, well-nourished, and in no distress.  Cardiovascular: Normal rate and regular rhythm.   Pulmonary/Chest: Effort normal and breath sounds normal. He has no rales.  Abdominal: Soft. Bowel sounds are normal. He exhibits no distension. There is no tenderness.  Musculoskeletal: He exhibits no edema.       Left shoulder: He exhibits tenderness. He exhibits normal range of motion, no bony tenderness, no effusion, no crepitus, no deformity, no spasm and normal strength.       Left elbow: He exhibits normal range of motion and no effusion. No tenderness found. No medial epicondyle and no lateral  epicondyle tenderness noted.  Nursing note and vitals reviewed.   Assessment & Plan:  Case discussed with Dr. Josem Kaufmann  Systolic CHF Wt Readings from Last 5 Encounters:  12/09/14 270 lb 8 oz (122.698 kg)  07/21/14 282 lb 9.6 oz (128.187 kg)  01/06/14 280 lb (127.007 kg)  12/16/13 277 lb 9.6 oz (125.919 kg)  12/03/13 267 lb (121.11 kg)   Weight down to 270 compared to previous readings this year.  Denies any SOB or functional limitations. - Continue COreg, Bidil, ASA, Losartan and Spironolactone. - Lasix  60mg  daily PRN  HYPERTENSION -Controlled at goal - Continue current therapy.  Constipation He has had increased straining and noticed some mild streaking of blood on hard stool.  He has run out of miralax and is requesting refills.  He has a known history of external hemorrhoids. -Refill Miralax - Continue Colace  Osteoarthritis of left shoulder He reports he has had some increased pain in his left shoulder for the last 3 months.  He points to a spot down from the shoulder in the deltoid region.  He has not been taking anything for this pain. On exam the etiology is somewhat unclear, he has no signs of AC joint or rotator cuff limitations, his biceps muscle is intact.  He does have a known history of OA of his shoulder and I suspect the pain is referred from the shoulder.  Plan: - Trial of 2 week course of Naproxen.  Prediabetes He has a history of Hyperglycemia on the chart.  However 6 months ago he had an A1c checked which was 7.0 which would be consistent with diabetes.  It is not clear if another confirmatory test was persued.  I suspect this is because he missed his follow up.  I did repeat an A1c today which returned at 6.2 more consistent with Impaired fasting glucose or prediabetes and I will update his chart to reflect this.  I did discuss the need for weight loss to prevent the development of diabetes.    Medications Ordered Meds ordered this encounter  Medications  .  furosemide (LASIX) 40 MG tablet    Sig: Take 1.5 tablets (60 mg total) by mouth daily.    Dispense:  270 tablet    Refill:  1  . polyethylene glycol powder (GLYCOLAX/MIRALAX) powder    Sig: DISSOLVE 1 CAPFUL BY MOUTH DAILY AS DIRECTED    Dispense:  255 g    Refill:  6  . docusate sodium (COLACE) 100 MG capsule    Sig: Take 1 capsule (100 mg total) by mouth 2 (two) times daily.    Dispense:  180 capsule    Refill:  0  . naproxen (NAPROSYN) 500 MG tablet    Sig: Take 1 tablet (500 mg total) by mouth 2 (two) times daily with a meal.    Dispense:  30 tablet    Refill:  0   Other Orders Orders Placed This Encounter  Procedures  . Glucose, capillary  . POC Hbg A1C   Follow Up: Return in about 3 months (around 03/09/2015), or if symptoms worsen or fail to improve.

## 2014-12-13 NOTE — Assessment & Plan Note (Signed)
He has a history of Hyperglycemia on the chart.  However 6 months ago he had an A1c checked which was 7.0 which would be consistent with diabetes.  It is not clear if another confirmatory test was persued.  I suspect this is because he missed his follow up.  I did repeat an A1c today which returned at 6.2 more consistent with Impaired fasting glucose or prediabetes and I will update his chart to reflect this.  I did discuss the need for weight loss to prevent the development of diabetes.

## 2014-12-13 NOTE — Assessment & Plan Note (Signed)
He reports he has had some increased pain in his left shoulder for the last 3 months.  He points to a spot down from the shoulder in the deltoid region.  He has not been taking anything for this pain. On exam the etiology is somewhat unclear, he has no signs of AC joint or rotator cuff limitations, his biceps muscle is intact.  He does have a known history of OA of his shoulder and I suspect the pain is referred from the shoulder.  Plan: - Trial of 2 week course of Naproxen.

## 2014-12-13 NOTE — Assessment & Plan Note (Signed)
Wt Readings from Last 5 Encounters:  12/09/14 270 lb 8 oz (122.698 kg)  07/21/14 282 lb 9.6 oz (128.187 kg)  01/06/14 280 lb (127.007 kg)  12/16/13 277 lb 9.6 oz (125.919 kg)  12/03/13 267 lb (121.11 kg)   Weight down to 270 compared to previous readings this year.  Denies any SOB or functional limitations. - Continue COreg, Bidil, ASA, Losartan and Spironolactone. - Lasix 60mg  daily PRN

## 2014-12-13 NOTE — Assessment & Plan Note (Addendum)
He has had increased straining and noticed some mild streaking of blood on hard stool.  He has run out of miralax and is requesting refills.  He has a known history of external hemorrhoids. -Refill Miralax - Continue Colace

## 2014-12-13 NOTE — Assessment & Plan Note (Signed)
-  Controlled at goal - Continue current therapy.

## 2014-12-28 NOTE — Progress Notes (Signed)
I saw and evaluated the patient.  I personally confirmed the key portions of Dr. Hoffman's history and exam and reviewed pertinent patient test results.  The assessment, diagnosis, and plan were formulated together and I agree with the documentation in the resident's note. 

## 2015-01-17 ENCOUNTER — Other Ambulatory Visit: Payer: Self-pay | Admitting: Internal Medicine

## 2015-01-18 NOTE — Telephone Encounter (Signed)
THe naproxen was given for 2 weeks on 12/4 . He needs f/u in the clinic

## 2015-01-20 ENCOUNTER — Encounter: Payer: Medicaid Other | Admitting: Internal Medicine

## 2015-02-10 ENCOUNTER — Encounter: Payer: Self-pay | Admitting: Internal Medicine

## 2015-02-10 ENCOUNTER — Ambulatory Visit (INDEPENDENT_AMBULATORY_CARE_PROVIDER_SITE_OTHER): Payer: Medicaid Other | Admitting: Internal Medicine

## 2015-02-10 VITALS — BP 132/72 | HR 92 | Temp 98.2°F | Wt 266.5 lb

## 2015-02-10 DIAGNOSIS — K5900 Constipation, unspecified: Secondary | ICD-10-CM

## 2015-02-10 DIAGNOSIS — R519 Headache, unspecified: Secondary | ICD-10-CM | POA: Insufficient documentation

## 2015-02-10 DIAGNOSIS — K219 Gastro-esophageal reflux disease without esophagitis: Secondary | ICD-10-CM

## 2015-02-10 DIAGNOSIS — G4700 Insomnia, unspecified: Secondary | ICD-10-CM | POA: Diagnosis not present

## 2015-02-10 DIAGNOSIS — G4733 Obstructive sleep apnea (adult) (pediatric): Secondary | ICD-10-CM | POA: Diagnosis not present

## 2015-02-10 DIAGNOSIS — I429 Cardiomyopathy, unspecified: Secondary | ICD-10-CM | POA: Diagnosis present

## 2015-02-10 DIAGNOSIS — F329 Major depressive disorder, single episode, unspecified: Secondary | ICD-10-CM

## 2015-02-10 DIAGNOSIS — I428 Other cardiomyopathies: Secondary | ICD-10-CM

## 2015-02-10 DIAGNOSIS — G44219 Episodic tension-type headache, not intractable: Secondary | ICD-10-CM

## 2015-02-10 DIAGNOSIS — F32A Depression, unspecified: Secondary | ICD-10-CM

## 2015-02-10 DIAGNOSIS — R51 Headache: Secondary | ICD-10-CM | POA: Diagnosis not present

## 2015-02-10 DIAGNOSIS — K5909 Other constipation: Secondary | ICD-10-CM | POA: Diagnosis not present

## 2015-02-10 MED ORDER — ACETAMINOPHEN 325 MG PO TABS: 650.0000 mg | ORAL_TABLET | Freq: Four times a day (QID) | ORAL | Status: DC | PRN

## 2015-02-10 MED ORDER — OMEPRAZOLE 40 MG PO CPDR: 40.0000 mg | DELAYED_RELEASE_CAPSULE | Freq: Every day | ORAL | Status: DC

## 2015-02-10 MED ORDER — ISOSORB DINITRATE-HYDRALAZINE 20-37.5 MG PO TABS: 1.0000 | ORAL_TABLET | Freq: Three times a day (TID) | ORAL | Status: DC

## 2015-02-10 MED ORDER — POLYETHYLENE GLYCOL 3350 17 GM/SCOOP PO POWD: 17.0000 g | Freq: Every day | ORAL | Status: DC

## 2015-02-10 MED ORDER — CARVEDILOL 25 MG PO TABS: 25.0000 mg | ORAL_TABLET | Freq: Two times a day (BID) | ORAL | Status: DC

## 2015-02-10 MED ORDER — SPIRONOLACTONE 25 MG PO TABS
25.0000 mg | ORAL_TABLET | Freq: Every day | ORAL | Status: DC
Start: 2015-02-10 — End: 2015-11-19

## 2015-02-10 MED ORDER — ZOLPIDEM TARTRATE 5 MG PO TABS: 5.0000 mg | ORAL_TABLET | Freq: Every evening | ORAL | Status: DC | PRN

## 2015-02-10 NOTE — Patient Instructions (Signed)
PLEASE TAKE ALL MEDICATIONS AS PRESCRIBED.  PLEASE FOLLOW UP WITH DR. Shirlee Latch, CARDIOLOGY. HIS NUMBER IS [phone].  FOR YOUR HEADACHES, TRY TYLENOL AS PRESCRIBED.  FOR SLEEP, USE AMBIEN 5 MG BEFORE BEDTIME.   FOR CONSTIPATION, USE MIRALAX AS PRESCRIBED.   WE WILL SCHEDULE FOR A SLEEP TEST.

## 2015-02-10 NOTE — Assessment & Plan Note (Signed)
Patient admits to a one week history of left temporal headache that presents 2-3 times per day, lasts for 20 minutes and resolves on its own. It is not associated with nausea, vomiting, photophobia, or phonophobia. He has had similar headaches in the past. He denies tearing of the left eye or vision changes. He has not tried anything for the headaches. His symptoms sound most consistent with tension type headaches likely due to increased stress and decreased sleep. He does not wake up with a headache, so less likely OSA; however, patient should have a repeat sleep study regardless.   Plan: -Tylenol Q6H prn headache -Ambien for increased sleep

## 2015-02-10 NOTE — Progress Notes (Signed)
Subjective:    Patient ID: Ethan Wilcox, male    DOB: 07-08-1955, 60 y.o.   MRN: [id number]  HPI Ethan Wilcox is a 60 y.o. male with PMHx of NICM, etoh and cocaine abuse, HTN, Gout who presents to the clinic for follow up for depression and headaches. Please see A&P for the status of the patient's chronic medical problems.   Past Medical History  Diagnosis Date  . Nonischemic cardiomyopathy (HCC)     admitted 5/11 w acute systolic CHF. LHC showed no angiographic CAD. Echo (5/11) showed EF 15-20% w diffuse sever hypokinesis, moderate MR> RHC showed PCWP 10, CI 1.47 (Fick.) cause of CMP thought to be heavy ETOH and cocaine. No ICD yet due to ongoing ETOH intake  . ETOH abuse   . Cocaine abuse   . Active smoker /    1/2 ppd  . Obesity   . Hypertension   . CHF (congestive heart failure) (HCC)   . Gout   . GERD (gastroesophageal reflux disease)   . Sleep apnea   . Hx of anaphylaxis with angioedema 08/23/2012    Admitted with angioedema on 08/24/2012 No intubation Unknown allergen Shrimp Vs Spices Vs ACEis Stopped Lisinopril     Outpatient Encounter Prescriptions as of 02/10/2015  Medication Sig  . acetaminophen (TYLENOL) 325 MG tablet Take 2 tablets (650 mg total) by mouth every 6 (six) hours as needed for headache.  [name] aspirin (ASPIR-LOW) 81 MG EC tablet Take 81 mg by mouth daily.   . carvedilol (COREG) 25 MG tablet Take 1 tablet (25 mg total) by mouth 2 (two) times daily.  . diphenhydrAMINE (BENADRYL) 25 MG tablet Take 25 mg by mouth every 6 (six) hours as needed for allergies.  [name] EPINEPHrine (EPIPEN) 0.3 mg/0.3 mL SOAJ injection Inject 0.3 mL (0.3 mg total) into the muscle once as needed (anaphylaxis). (Patient not taking: Reported on 07/21/2014)  . furosemide (LASIX) 40 MG tablet Take 1.5 tablets (60 mg total) by mouth daily.  . hydrocortisone (ANUSOL-HC) 2.5 % rectal cream Place 1 application rectally 2 (two) times daily. (Patient not taking: Reported on 07/21/2014)  .  isosorbide-hydrALAZINE (BIDIL) 20-37.5 MG tablet Take 1 tablet by mouth 3 (three) times daily.  [name] losartan (COZAAR) 50 MG tablet TAKE 1 1/2 TABLET BY MOUTH EVERY DAY  . Multiple Vitamin (MULTIVITAMIN WITH MINERALS) TABS Take 1 tablet by mouth daily.  . nicotine (NICODERM CQ - DOSED IN MG/24 HOURS) 14 mg/24hr patch Place 1 patch (14 mg total) onto the skin daily.  . nicotine (NICODERM CQ - DOSED IN MG/24 HOURS) 21 mg/24hr patch Place 1 patch (21 mg total) onto the skin daily.  . nicotine (NICODERM CQ - DOSED IN MG/24 HR) 7 mg/24hr patch Place 1 patch (7 mg total) onto the skin daily.  [name] omeprazole (PRILOSEC) 40 MG capsule Take 1 capsule (40 mg total) by mouth daily.  . polyethylene glycol powder (GLYCOLAX/MIRALAX) powder Take 17 g by mouth daily. DISSOLVE 1 CAPFUL BY MOUTH DAILY AS DIRECTED  . spironolactone (ALDACTONE) 25 MG tablet Take 1 tablet (25 mg total) by mouth daily.  [name] terbinafine (LAMISIL) 1 % cream Apply 1 application topically 2 (two) times daily. (Patient not taking: Reported on 07/21/2014)  . zolpidem (AMBIEN) 5 MG tablet Take 1 tablet (5 mg total) by mouth at bedtime as needed for sleep.  . [DISCONTINUED] BIDIL 20-37.5 MG per tablet TAKE 1 TABLET BY MOUTH THREE TIMES DAILY  . [DISCONTINUED] carvedilol (COREG) 25 MG tablet Take  1 tablet (25 mg total) by mouth 2 (two) times daily.  . [DISCONTINUED] docusate sodium (COLACE) 100 MG capsule Take 1 capsule (100 mg total) by mouth 2 (two) times daily.  . [DISCONTINUED] naproxen (NAPROSYN) 500 MG tablet Take 1 tablet (500 mg total) by mouth 2 (two) times daily with a meal.  . [DISCONTINUED] omeprazole (PRILOSEC) 40 MG capsule Take 1 capsule (40 mg total) by mouth 2 (two) times daily.  . [DISCONTINUED] polyethylene glycol powder (GLYCOLAX/MIRALAX) powder DISSOLVE 1 CAPFUL BY MOUTH DAILY AS DIRECTED  . [DISCONTINUED] spironolactone (ALDACTONE) 25 MG tablet TAKE 1 TABLET BY MOUTH DAILY.  . [DISCONTINUED] zolpidem (AMBIEN) 5 MG tablet Take 1  tablet (5 mg total) by mouth at bedtime as needed for sleep.   No facility-administered encounter medications on file as of 02/10/2015.    Family History  Problem Relation Age of Onset  . Heart disease Mother   . Cancer Mother   . Heart disease Father     Social History   Social History  . Marital Status: Single    Spouse Name: N/A  . Number of Children: N/A  . Years of Education: N/A   Occupational History  . DISABLED    Social History Main Topics  . Smoking status: Current Every Day Smoker -- 1.00 packs/day for 38 years    Types: Cigarettes  . Smokeless tobacco: Not on file  . Alcohol Use: 7.2 oz/week    12 Shots of liquor per week     Comment: Vodka 1/2 pint - on/off.  . Drug Use: No     Comment: former  . Sexual Activity: Not on file   Other Topics Concern  . Not on file   Social History Narrative   Works as Gaffer. 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 Heavy ETOH in past, now back to drinking 1/2 pint/week.          No show 07/16/09    Review of Systems General: Admits to fatigue. Denies change in appetite.  Respiratory: Admits to snoring. Denies SOB, cough, orthopnea, chest tightness.   Cardiovascular: Denies chest pain and palpitations.  Gastrointestinal: Admits to constipation. Denies nausea, vomiting, diarrhea, blood in stool Endocrine: Denies polyuria, and polydipsia. Musculoskeletal: Admits to occasional shoulder pain- improved. Denies myalgias.  Neurological: Admits to headaches. Denies dizziness, weakness, lightheadedness, vision changes. Psychiatric/Behavioral: Admits to depression and insomnia. Denies agitation, SI, HI.     Objective:   Physical Exam Filed Vitals:   02/10/15 1338  BP: 132/72  Pulse: 92  Temp: 98.2 F (36.8 C)  TempSrc: Oral  Weight: 266 lb 8 oz (120.884 kg)  SpO2: 99%   General: Vital signs reviewed.  Patient is obese, in no acute distress and cooperative with exam.  Eyes: EOMI, no scleral icterus.  Neck: Large  circumference. Supple, trachea midline, no JVD.  Cardiovascular: RRR, S1 normal, S2 normal. Pulmonary/Chest: Clear to auscultation bilaterally, no wheezes, rales, or rhonchi. Abdominal: Soft, non-tender, BS +.  Extremities: No lower extremity edema bilaterally.  Skin: Warm, dry and intact.  Psychiatric: Normal mood and affect. speech and behavior is normal. Cognition and memory are normal.      Assessment & Plan:   Please see problem based assessment and plan.

## 2015-02-10 NOTE — Assessment & Plan Note (Signed)
Patient admits to recent depression surrounding the death of his long time girlfriend of 35 years. PHQ-9 score was 8 indicating mild depression. He is not interested in starting a medication at this time. He feels that he is managing his symptoms well, but wishes he could sleep better at night (see insomnia). He denies SI/HI.  Plan: -Conservative management -Consider grief counseling if prolonged -Ambien 5 mg QHS prn

## 2015-02-10 NOTE — Assessment & Plan Note (Signed)
Refilled omeprazole 40 mg daily. 

## 2015-02-10 NOTE — Assessment & Plan Note (Signed)
Patient admits to history of sleep apnea. He know longer has his CPAP machine, but would be willing to redo a sleep study to restart his CPAP machine.   Plan: -Referral for polysomnography test

## 2015-02-10 NOTE — Assessment & Plan Note (Signed)
Patient admits to chronic constipation which improved with Miralax and requests refill. He denies melena or hematochezia. No family history of colon cancer. States last colonoscopy was 4-10 years ago and had polyps. There are no records of this. Previous notes have discrepancies of when next colonoscopy should be.  Plan: -Refill Miralax 17 gram daily prn -Follow up with PCP regarding future colonoscopy screening

## 2015-02-10 NOTE — Assessment & Plan Note (Signed)
Last echo in May 2015 revealed EF 30-35% and grade 1 diastolic dysfunction. Patient last saw Dr. Shirlee Latch in 2015 and needs a follow up appointment. He has been mostly compliant with his medications but admits he has not taken his Bidil in one week. He denies weight gain, shortness of breath, lower extremity edema. No evidence of volume overload on examination. Weight 266 today, 270 in November.   Plan: -Follow up with Dr. Shirlee Latch -Continue Bidil 20-37.5 mg TID -Continue Spironolactone 25 mg daily -Continue Lasix 60 mg daily -Continue Losartan 75 mg daily -Continue Coreg 25 mg BID -Last BMET in July within normal limits, recheck in 6 months

## 2015-02-10 NOTE — Assessment & Plan Note (Addendum)
Patient admits to trouble falling asleep and staying asleep. He has tried advil pm without relief. He feels that his depression and grief surrounding his girlfriend's death is contributing to his insomnia. He would like to try [place] again. His severe sleep apnea could also be contributing to difficultly staying asleep and fatigue during the day for which we referred him for a repeat sleep study.   Plan: -Ambien 5 mg QHS prn

## 2015-02-10 NOTE — Progress Notes (Signed)
Internal Medicine Clinic Attending  Case discussed with Dr. Richardson at the time of the visit.  We reviewed the resident's history and exam and pertinent patient test results.  I agree with the assessment, diagnosis, and plan of care documented in the resident's note. 

## 2015-02-18 ENCOUNTER — Ambulatory Visit (INDEPENDENT_AMBULATORY_CARE_PROVIDER_SITE_OTHER): Payer: Medicaid Other | Admitting: Internal Medicine

## 2015-02-18 ENCOUNTER — Encounter: Payer: Self-pay | Admitting: Internal Medicine

## 2015-02-18 VITALS — BP 131/95 | HR 85 | Temp 98.1°F | Ht 66.0 in | Wt 263.8 lb

## 2015-02-18 DIAGNOSIS — R51 Headache: Secondary | ICD-10-CM

## 2015-02-18 DIAGNOSIS — I429 Cardiomyopathy, unspecified: Secondary | ICD-10-CM | POA: Diagnosis not present

## 2015-02-18 DIAGNOSIS — I428 Other cardiomyopathies: Secondary | ICD-10-CM

## 2015-02-18 DIAGNOSIS — G4440 Drug-induced headache, not elsewhere classified, not intractable: Secondary | ICD-10-CM

## 2015-02-18 NOTE — Progress Notes (Signed)
Subjective:   Patient ID: Ethan Wilcox male   DOB: Jan 24, 1955 60 y.o.   MRN: [id number]  HPI: [name] is a 60 y.o. male w/ PMHx of NICM (EF 15-20%) thought to be related to alcohol and cocaine abuse, HTN, GERD, and sleep apnea, presents to the clinic today for an acute visit for headaches. Patient says this has been going on for a few weeks, described as mostly left-sided, temporal, frontal, and behind his eye, mostly dull in nature, does not affect his sight. Says that sometimes he feels slightly sensitive to the light, but not sound. No associated nausea, vomiting, fever, chills, neck pain, or stiffness. No tearing or eye redness. Takes Bidil for his CHF and stopped taking this a few days ago and says this has helped somewhat. Does admit to increased stress and anxiety. Has been taking Tylenol prn for pain and feels that this has helped.   Past Medical History  Diagnosis Date  . Nonischemic cardiomyopathy (HCC)     admitted 5/11 w acute systolic CHF. LHC showed no angiographic CAD. Echo (5/11) showed EF 15-20% w diffuse sever hypokinesis, moderate MR> RHC showed PCWP 10, CI 1.47 (Fick.) cause of CMP thought to be heavy ETOH and cocaine. No ICD yet due to ongoing ETOH intake  . ETOH abuse   . Cocaine abuse   . Active smoker /    1/2 ppd  . Obesity   . Hypertension   . CHF (congestive heart failure) (HCC)   . Gout   . GERD (gastroesophageal reflux disease)   . Sleep apnea   . 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 acetaminophen (TYLENOL) 325 MG tablet Take 2 tablets (650 mg total) by mouth every 6 (six) hours as needed for headache. 30 tablet 0  . aspirin (ASPIR-LOW) 81 MG EC tablet Take 81 mg by mouth daily.     . carvedilol (COREG) 25 MG tablet Take 1 tablet (25 mg total) by mouth  2 (two) times daily. 180 tablet 2  . diphenhydrAMINE (BENADRYL) 25 MG tablet Take 25 mg by mouth every 6 (six) hours as needed for allergies.    [name] EPINEPHrine (EPIPEN) 0.3 mg/0.3 mL SOAJ injection Inject 0.3 mL (0.3 mg total) into the muscle once as needed (anaphylaxis). (Patient not taking: Reported on 07/21/2014) 1 Device 1  . furosemide (LASIX) 40 MG tablet Take 1.5 tablets (60 mg total) by mouth daily. 270 tablet 1  . hydrocortisone (ANUSOL-HC) 2.5 % rectal cream Place 1 application rectally 2 (two) times daily. (Patient not taking: Reported on 07/21/2014) 30 g 1  . isosorbide-hydrALAZINE (BIDIL) 20-37.5 MG tablet Take 1 tablet by mouth 3 (three) times daily. 90 tablet 2  . losartan (COZAAR) 50 MG tablet TAKE 1 1/2 TABLET BY MOUTH EVERY DAY 45 tablet 0  . Multiple Vitamin (MULTIVITAMIN WITH MINERALS) TABS Take 1 tablet by mouth daily.    . nicotine (NICODERM CQ - DOSED IN MG/24 HOURS) 14 mg/24hr patch Place 1 patch (14 mg total) onto the skin daily. 7 patch 0  . nicotine (NICODERM CQ - DOSED IN MG/24 HOURS) 21 mg/24hr patch Place 1 patch (21 mg total) onto the skin daily. 7 patch 0  . nicotine (NICODERM CQ - DOSED IN MG/24 HR) 7 mg/24hr patch Place 1 patch (7 mg total)  onto the skin daily. 7 patch 0  . omeprazole (PRILOSEC) 40 MG capsule Take 1 capsule (40 mg total) by mouth daily. 90 capsule 2  . polyethylene glycol powder (GLYCOLAX/MIRALAX) powder Take 17 g by mouth daily. DISSOLVE 1 CAPFUL BY MOUTH DAILY AS DIRECTED 255 g 6  . spironolactone (ALDACTONE) 25 MG tablet Take 1 tablet (25 mg total) by mouth daily. 90 tablet 2  . terbinafine (LAMISIL) 1 % cream Apply 1 application topically 2 (two) times daily. (Patient not taking: Reported on 07/21/2014) 30 g 0  . zolpidem (AMBIEN) 5 MG tablet Take 1 tablet (5 mg total) by mouth at bedtime as needed for sleep. 30 tablet 2   No current facility-administered medications for this visit.    Review of Systems: General: Denies fever, chills,  diaphoresis, appetite change and fatigue.  Respiratory: Denies SOB, DOE, cough, and wheezing.   Cardiovascular: Denies chest pain and palpitations.  Gastrointestinal: Denies nausea, vomiting, abdominal pain, and diarrhea.  Genitourinary: Denies dysuria, increased frequency, and flank pain. Endocrine: Denies hot or cold intolerance, polyuria, and polydipsia. Musculoskeletal: Denies myalgias, back pain, joint swelling, arthralgias and gait problem.  Skin: Denies pallor, rash and wounds.  Neurological: Positive for headaches. Denies dizziness, seizures, syncope, weakness, lightheadedness, numbness. Psychiatric/Behavioral: Denies mood changes, and sleep disturbances.  Objective:   Physical Exam: Filed Vitals:   02/18/15 0908  BP: 131/95  Pulse: 85  Temp: 98.1 F (36.7 C)  TempSrc: Oral  Height: 5\' 6"  (1.676 m)  Weight: 263 lb 12.8 oz (119.659 kg)  SpO2: 100%    General: Obese AA male, alert, cooperative, NAD. HEENT: PERRL, EOMI. Moist mucus membranes. No left-sided eye redness or decreased vision.  Neck: Full range of motion without pain, supple, no lymphadenopathy or carotid bruits Lungs: Clear to ascultation bilaterally, normal work of respiration, no wheezes, rales, rhonchi Heart: RRR, no murmurs, gallops, or rubs Abdomen: Soft, non-tender, non-distended, BS + Extremities: No cyanosis, clubbing, or edema Neurologic: Alert & oriented x3, cranial nerves II-XII intact, strength grossly intact, sensation intact to light touch   Assessment & Plan:   Please see problem based assessment and plan.

## 2015-02-18 NOTE — Patient Instructions (Signed)
1. Please return in 1 week.   2. Please take all medications as previously prescribed with the following changes:  HOLD BIDIL.   Take Tylenol and Naproxen over the counter as needed for continued headache symptoms.   3. If you have worsening of your symptoms or new symptoms arise, please call the clinic ([phone]), or go to the ER immediately if symptoms are severe.

## 2015-02-18 NOTE — Assessment & Plan Note (Signed)
Symptoms well controlled, no SOB or DOE per patient. Lungs clear on exam, no peripheral edema. Has appointment with Dr. Shirlee Latch this month.  -Continue Coreg, Lasix, Spironolactone, ARB -HOLD Bidil for now given headache -Patient to return in 1 week for follow up regarding headache

## 2015-02-18 NOTE — Assessment & Plan Note (Signed)
Patient continues to have headache, left-sided, frontal, temporal headache, behind his eye, dull in nature. Says this improved after holding his Bidil on his own. Has been off this medication for a few days now. Takes Tylenol intermittently which she says helps. No eye watering, redness, or sharp severe pain behind the eye. Pupils reactive. Do not suspect glaucoma or migraine. Feel this is related to Bidil, although he has been taking this medication for quite some time, but does admit he has had headaches related to taking his Bidil before. No nausea, vomiting, ort neck stiffness. Could also be tension headaches as previously described by Dr. Senaida Ores, or cluster, the latter of which I feel is less likely.  -Hold Bidil for now. RTC in 1 week to determine if this is the causative agent -Tylenol prn + NSAID; suggested intermittent Aleve prn

## 2015-02-22 NOTE — Progress Notes (Signed)
Internal Medicine Clinic Attending  Case discussed with Dr. Yetta Barre at the time of the visit.  We reviewed the resident's history and exam and pertinent patient test results.  I agree with the assessment, diagnosis, and plan of care documented in the resident's note.  In addition to the above plan I would also discontinue Hydralazine. Likely there is no benefit to hydralazine if we are also stopping the long acting nitrate. If the headache is unaffected by holding the long acting nitrate, we could potentially restart both in the future, and focus on further treatments for this headache disorder.

## 2015-03-01 ENCOUNTER — Ambulatory Visit (INDEPENDENT_AMBULATORY_CARE_PROVIDER_SITE_OTHER): Payer: Medicaid Other | Admitting: Internal Medicine

## 2015-03-01 ENCOUNTER — Encounter: Payer: Self-pay | Admitting: Internal Medicine

## 2015-03-01 VITALS — BP 149/88 | HR 71 | Temp 97.9°F | Wt 264.3 lb

## 2015-03-01 DIAGNOSIS — G4700 Insomnia, unspecified: Secondary | ICD-10-CM

## 2015-03-01 DIAGNOSIS — Z299 Encounter for prophylactic measures, unspecified: Secondary | ICD-10-CM

## 2015-03-01 DIAGNOSIS — G44221 Chronic tension-type headache, intractable: Secondary | ICD-10-CM

## 2015-03-01 DIAGNOSIS — F17210 Nicotine dependence, cigarettes, uncomplicated: Secondary | ICD-10-CM

## 2015-03-01 DIAGNOSIS — R51 Headache: Secondary | ICD-10-CM | POA: Diagnosis not present

## 2015-03-01 DIAGNOSIS — I10 Essential (primary) hypertension: Secondary | ICD-10-CM

## 2015-03-01 MED ORDER — IBUPROFEN 600 MG PO TABS: 600.0000 mg | ORAL_TABLET | Freq: Four times a day (QID) | ORAL | Status: DC | PRN

## 2015-03-01 NOTE — Progress Notes (Signed)
Patient ID: Ethan Wilcox, male   DOB: 02-24-1955, 60 y.o.   MRN: [id number]    Subjective:   Patient ID: Ethan Wilcox male   DOB: 1955-09-08 60 y.o.   MRN: [id number]  HPI: [name] is a 60 y.o. pleasant man with past medical history of NICM, chronic combined CHF (EF 30-35%), hypertension, hyperlipidemia, depression, prediabetes, OSA, OA, and GERD who presents with chief complaint of headache.   He reports 2-week history of left sided throbbing pain overlying his eye that comes and goes with maximum 10/10 intensity with no photophobia, nausea/vomitng, scalp tenderness, or changes in vision. He has had similar headaches years ago. He denies personal or family history of migraine headaches. He has only been sleeping 2 hours for the past week and tried [place] with no relief. He has used benadryl in the past that did help. He has been using previously prescribed tylenol 500 mg every 3-4 hrs with mild relief. He has also used excedrin with better relief. Drinking coffee also helps his headache. He has not been using bidil due to aggravation of his headache.  Per nurse, pt's significant other recently passed away about 1-2 months ago.  He does not want flu vaccine.    Past Medical History  Diagnosis Date  . Nonischemic cardiomyopathy (HCC)     admitted 5/11 w acute systolic CHF. LHC showed no angiographic CAD. Echo (5/11) showed EF 15-20% w diffuse sever hypokinesis, moderate MR> RHC showed PCWP 10, CI 1.47 (Fick.) cause of CMP thought to be heavy ETOH and cocaine. No ICD yet due to ongoing ETOH intake  . ETOH abuse   . Cocaine abuse   . Active smoker /    1/2 ppd  . Obesity   . Hypertension   . CHF (congestive heart failure) (HCC)   . Gout   . GERD (gastroesophageal reflux disease)   . Sleep apnea   . Hx of anaphylaxis with angioedema 08/23/2012    Admitted with angioedema on 08/24/2012 No intubation Unknown allergen Shrimp Vs Spices Vs ACEis Stopped Lisinopril    Current  Outpatient Prescriptions  Medication Sig Dispense Refill  . acetaminophen (TYLENOL) 325 MG tablet Take 2 tablets (650 mg total) by mouth every 6 (six) hours as needed for headache. 30 tablet 0  . aspirin (ASPIR-LOW) 81 MG EC tablet Take 81 mg by mouth daily.     . carvedilol (COREG) 25 MG tablet Take 1 tablet (25 mg total) by mouth 2 (two) times daily. 180 tablet 2  . diphenhydrAMINE (BENADRYL) 25 MG tablet Take 25 mg by mouth every 6 (six) hours as needed for allergies.    [name] EPINEPHrine (EPIPEN) 0.3 mg/0.3 mL SOAJ injection Inject 0.3 mL (0.3 mg total) into the muscle once as needed (anaphylaxis). (Patient not taking: Reported on 07/21/2014) 1 Device 1  . furosemide (LASIX) 40 MG tablet Take 1.5 tablets (60 mg total) by mouth daily. 270 tablet 1  . hydrocortisone (ANUSOL-HC) 2.5 % rectal cream Place 1 application rectally 2 (two) times daily. (Patient not taking: Reported on 07/21/2014) 30 g 1  . isosorbide-hydrALAZINE (BIDIL) 20-37.5 MG tablet Take 1 tablet by mouth 3 (three) times daily. 90 tablet 2  . losartan (COZAAR) 50 MG tablet TAKE 1 1/2 TABLET BY MOUTH EVERY DAY 45 tablet 0  . Multiple Vitamin (MULTIVITAMIN WITH MINERALS) TABS Take 1 tablet by mouth daily.    . nicotine (NICODERM CQ - DOSED IN MG/24 HOURS) 14 mg/24hr patch Place 1 patch (14 mg  total) onto the skin daily. 7 patch 0  . nicotine (NICODERM CQ - DOSED IN MG/24 HOURS) 21 mg/24hr patch Place 1 patch (21 mg total) onto the skin daily. 7 patch 0  . nicotine (NICODERM CQ - DOSED IN MG/24 HR) 7 mg/24hr patch Place 1 patch (7 mg total) onto the skin daily. 7 patch 0  . omeprazole (PRILOSEC) 40 MG capsule Take 1 capsule (40 mg total) by mouth daily. 90 capsule 2  . polyethylene glycol powder (GLYCOLAX/MIRALAX) powder Take 17 g by mouth daily. DISSOLVE 1 CAPFUL BY MOUTH DAILY AS DIRECTED 255 g 6  . spironolactone (ALDACTONE) 25 MG tablet Take 1 tablet (25 mg total) by mouth daily. 90 tablet 2  . terbinafine (LAMISIL) 1 % cream Apply 1  application topically 2 (two) times daily. (Patient not taking: Reported on 07/21/2014) 30 g 0  . zolpidem (AMBIEN) 5 MG tablet Take 1 tablet (5 mg total) by mouth at bedtime as needed for sleep. 30 tablet 2   No current facility-administered medications for this visit.   Family History  Problem Relation Age of Onset  . Heart disease Mother   . Cancer Mother   . Heart disease Father    Social History   Social History  . Marital Status: Single    Spouse Name: N/A  . Number of Children: N/A  . Years of Education: N/A   Occupational History  . DISABLED    Social History Main Topics  . Smoking status: Current Every Day Smoker -- 1.00 packs/day for 38 years    Types: Cigarettes  . Smokeless tobacco: Not on file     Comment: down to .5 pk.  about 1 week ago  . Alcohol Use: 7.2 oz/week    12 Shots of liquor per week     Comment: Vodka 1/2 pint - on/off.  . Drug Use: No     Comment: former  . Sexual Activity: Not on file   Other Topics Concern  . Not on file   Social History Narrative   Works as Gaffer. 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 Heavy ETOH in past, now back to drinking 1/2 pint/week.          No show 07/16/09   Review of Systems: Review of Systems  Constitutional: Negative for fever and chills.  Eyes: Positive for pain (with headache). Negative for blurred vision and discharge.  Respiratory: Negative for cough, shortness of breath and wheezing.   Cardiovascular: Negative for chest pain and leg swelling.  Gastrointestinal: Negative for nausea, vomiting, diarrhea and constipation.  Genitourinary: Negative for dysuria and frequency.  Neurological: Positive for headaches (for 2 weeks). Negative for dizziness, sensory change and focal weakness.  Psychiatric/Behavioral: The patient has insomnia.      Objective:  Physical Exam: Filed Vitals:   03/01/15 1431  BP: 149/88  Pulse: 71  Temp: 97.9 F (36.6 C)  TempSrc: Oral  Weight: 264 lb 4.8 oz (119.886 kg)  SpO2:  100%    Physical Exam  Constitutional: He is oriented to person, place, and time. He appears well-developed and well-nourished. No distress.  HENT:  Head: Normocephalic and atraumatic.  Right Ear: External ear normal.  Left Ear: External ear normal.  Nose: Nose normal.  Mouth/Throat: Oropharynx is clear and moist. No oropharyngeal exudate.  No temporal scalp tenderness   Eyes: EOM are normal. Pupils are equal, round, and reactive to light. Right eye exhibits no discharge. Left eye exhibits no discharge. No scleral icterus.  Erythema  of b/l conjuctiva   Neck: Normal range of motion. Neck supple.  Cardiovascular: Normal rate and regular rhythm.   Distant heart sounds  Pulmonary/Chest: Effort normal and breath sounds normal. No respiratory distress. He has no wheezes. He has no rales.  Abdominal: Soft. Bowel sounds are normal. He exhibits no distension. There is no tenderness. There is no rebound and no guarding.  Musculoskeletal: Normal range of motion. He exhibits no edema or tenderness.  Neurological: He is alert and oriented to person, place, and time.  Skin: Skin is warm and dry. No rash noted. He is not diaphoretic. No erythema. No pallor.  Psychiatric: He has a normal mood and affect. His behavior is normal. Judgment and thought content normal.    Assessment & Plan:  Please see problem list for problem-based assessment and plan

## 2015-03-01 NOTE — Patient Instructions (Signed)
-  Start taking ibuprofen 600 mg every 6 hrs as needed for headache. Don't take the tylenol.  -Increase your caffeine intake  -Take benadryl 25 mg for sleep instead of ambien  -Please come back if your headache does not get better  General Instructions:   Thank you for bringing your medicines today. This helps [place] keep you safe from mistakes.   Progress Toward Treatment Goals:  Treatment Goal 07/30/2013  Blood pressure unchanged  Stop smoking smoking the same amount    Self Care Goals & Plans:  Self Care Goal 02/18/2015  Manage my medications take my medicines as prescribed; bring my medications to every visit; refill my medications on time  Monitor my health keep track of my blood pressure  Eat healthy foods drink diet soda or water instead of juice or soda; eat more vegetables; eat foods that are low in salt; eat baked foods instead of fried foods; eat fruit for snacks and desserts  Be physically active find an activity I enjoy  Stop smoking cut down the number of cigarettes smoked  Meeting treatment goals maintain the current self-care plan    No flowsheet data found.   Care Management & Community Referrals:  Referral 07/30/2013  Referrals made for care management support health educator

## 2015-03-01 NOTE — Assessment & Plan Note (Signed)
Assessment: Pt with 2-week history of insomnia most likely due to recent passing of significant other.   Plan:  -Pt instructed to take OTC benadryl instead of ambien -If persists consider trazodone to also treat possible underlying depression

## 2015-03-01 NOTE — Assessment & Plan Note (Addendum)
Assessment: Pt with well-controlled hypertension compliant with four-class (BB, diuretic, ARB, and nitrate) anti-hypertensive therapy who presents with blood pressure of 149/88.   Plan:  -BP 149/88 not at goal <140/90 in setting of holding bidil -Continue lasix 60 mg daily, coreg 25 mg BID, spironolactone 25 mg daily, and losartan 75 mg daily  -Continue to bidil in setting of headaches -Obtain CMP at next visit

## 2015-03-01 NOTE — Assessment & Plan Note (Addendum)
-  Pt declined annual influenza vaccination  -Obtain screening Hepatitis C Ab at next visit -Inquire regarding screening colonoscopy at next visit

## 2015-03-01 NOTE — Assessment & Plan Note (Addendum)
Assessment: Pt with 2-week history of most likely tension-type headache in setting of insomnia for past 2 weeks.   Plan: -Discontinue tylenol  -Prescribe ibuprofen 600 mg Q 6 hr PRN  -If no response consider adding caffeine  -Pt instructed to increase dietary caffeine intake -Pt instructed to take benadryl instead of ambien  -Continue to hold bidil until headache resolves -Pt instructed to return if headache does not resolve

## 2015-03-03 NOTE — Progress Notes (Signed)
Internal Medicine Clinic Attending  Case discussed with Dr. Rabbani soon after the resident saw the patient.  We reviewed the resident's history and exam and pertinent patient test results.  I agree with the assessment, diagnosis, and plan of care documented in the resident's note.  

## 2015-03-05 ENCOUNTER — Encounter (HOSPITAL_COMMUNITY): Payer: Self-pay | Admitting: *Deleted

## 2015-03-05 ENCOUNTER — Emergency Department (HOSPITAL_COMMUNITY)
Admission: EM | Admit: 2015-03-05 | Discharge: 2015-03-06 | Disposition: A | Discharge status: Home / Self Care | Payer: Medicaid Other | Attending: Emergency Medicine | Admitting: Emergency Medicine

## 2015-03-05 DIAGNOSIS — F17210 Nicotine dependence, cigarettes, uncomplicated: Secondary | ICD-10-CM | POA: Diagnosis not present

## 2015-03-05 DIAGNOSIS — Z7982 Long term (current) use of aspirin: Secondary | ICD-10-CM | POA: Diagnosis not present

## 2015-03-05 DIAGNOSIS — Z880 Allergy status to penicillin: Secondary | ICD-10-CM | POA: Diagnosis not present

## 2015-03-05 DIAGNOSIS — Z8739 Personal history of other diseases of the musculoskeletal system and connective tissue: Secondary | ICD-10-CM | POA: Diagnosis not present

## 2015-03-05 DIAGNOSIS — I509 Heart failure, unspecified: Secondary | ICD-10-CM | POA: Insufficient documentation

## 2015-03-05 DIAGNOSIS — R05 Cough: Secondary | ICD-10-CM | POA: Diagnosis present

## 2015-03-05 DIAGNOSIS — I10 Essential (primary) hypertension: Secondary | ICD-10-CM | POA: Insufficient documentation

## 2015-03-05 DIAGNOSIS — Z79899 Other long term (current) drug therapy: Secondary | ICD-10-CM | POA: Diagnosis not present

## 2015-03-05 DIAGNOSIS — E669 Obesity, unspecified: Secondary | ICD-10-CM | POA: Diagnosis not present

## 2015-03-05 DIAGNOSIS — J40 Bronchitis, not specified as acute or chronic: Secondary | ICD-10-CM

## 2015-03-05 DIAGNOSIS — K219 Gastro-esophageal reflux disease without esophagitis: Secondary | ICD-10-CM | POA: Diagnosis not present

## 2015-03-05 DIAGNOSIS — Z87892 Personal history of anaphylaxis: Secondary | ICD-10-CM | POA: Insufficient documentation

## 2015-03-05 DIAGNOSIS — Z8669 Personal history of other diseases of the nervous system and sense organs: Secondary | ICD-10-CM | POA: Diagnosis not present

## 2015-03-05 NOTE — ED Notes (Signed)
Pt c/o mucous cough x 2 days.

## 2015-03-06 ENCOUNTER — Emergency Department (HOSPITAL_COMMUNITY): Payer: Medicaid Other

## 2015-03-06 MED ORDER — IPRATROPIUM-ALBUTEROL 0.5-2.5 (3) MG/3ML IN SOLN
3.0000 mL | Freq: Once | RESPIRATORY_TRACT | Status: AC
  Administered 2015-03-06: 3 mL via RESPIRATORY_TRACT
  Filled 2015-03-06: qty 3

## 2015-03-06 MED ORDER — IBUPROFEN 400 MG PO TABS
600.0000 mg | ORAL_TABLET | Freq: Once | ORAL | Status: AC
  Administered 2015-03-06: 600 mg via ORAL
  Filled 2015-03-06: qty 1

## 2015-03-06 MED ORDER — HYDROCOD POLST-CPM POLST ER 10-8 MG/5ML PO SUER: 5.0000 mL | Freq: Every evening | ORAL | Status: DC | PRN

## 2015-03-06 MED ORDER — ALBUTEROL SULFATE HFA 108 (90 BASE) MCG/ACT IN AERS
2.0000 | INHALATION_SPRAY | Freq: Once | RESPIRATORY_TRACT | Status: AC
  Administered 2015-03-06: 2 via RESPIRATORY_TRACT
  Filled 2015-03-06: qty 6.7

## 2015-03-06 MED ORDER — PREDNISONE 10 MG (21) PO TBPK: 10.0000 mg | ORAL_TABLET | Freq: Every day | ORAL | Status: DC

## 2015-03-06 NOTE — ED Notes (Signed)
Albuterol inhaler given at discharge, educated on use. Pt demonstrated/verbalized understanding.

## 2015-03-06 NOTE — Discharge Instructions (Signed)
Read the information below.  Use the prescribed medication as directed.  Please discuss all new medications with your pharmacist.  You may return to the Emergency Department at any time for worsening condition or any new symptoms that concern you.  If you develop high fevers that do not resolve with tylenol or ibuprofen, you have difficulty swallowing or breathing, or you are unable to tolerate fluids by mouth, return to the ER for a recheck.      Cough, Adult Coughing is a reflex that clears your throat and your airways. Coughing helps to heal and protect your lungs. It is normal to cough occasionally, but a cough that happens with other symptoms or lasts a long time may be a sign of a condition that needs treatment. A cough may last only 2-3 weeks (acute), or it may last longer than 8 weeks (chronic). CAUSES Coughing is commonly caused by:  Breathing in substances that irritate your lungs.  A viral or bacterial respiratory infection.  Allergies.  Asthma.  Postnasal drip.  Smoking.  Acid backing up from the stomach into the esophagus (gastroesophageal reflux).  Certain medicines.  Chronic lung problems, including COPD (or rarely, lung cancer).  Other medical conditions such as heart failure. HOME CARE INSTRUCTIONS  Pay attention to any changes in your symptoms. Take these actions to help with your discomfort:  Take medicines only as told by your health care provider.  If you were prescribed an antibiotic medicine, take it as told by your health care provider. Do not stop taking the antibiotic even if you start to feel better.  Talk with your health care provider before you take a cough suppressant medicine.  Drink enough fluid to keep your urine clear or pale yellow.  If the air is dry, use a cold steam vaporizer or humidifier in your bedroom or your home to help loosen secretions.  Avoid anything that causes you to cough at work or at home.  If your cough is worse at  night, try sleeping in a semi-upright position.  Avoid cigarette smoke. If you smoke, quit smoking. If you need help quitting, ask your health care provider.  Avoid caffeine.  Avoid alcohol.  Rest as needed. SEEK MEDICAL CARE IF:   You have new symptoms.  You cough up pus.  Your cough does not get better after 2-3 weeks, or your cough gets worse.  You cannot control your cough with suppressant medicines and you are losing sleep.  You develop pain that is getting worse or pain that is not controlled with pain medicines.  You have a fever.  You have unexplained weight loss.  You have night sweats. SEEK IMMEDIATE MEDICAL CARE IF:  You cough up blood.  You have difficulty breathing.  Your heartbeat is very fast.   This information is not intended to replace advice given to you by your health care provider. Make sure you discuss any questions you have with your health care provider.   Document Released: 06/24/2010 Document Revised: 09/16/2014 Document Reviewed: 03/04/2014 Elsevier Interactive Patient Education 2016 [employer]  Viral Infections A viral infection can be caused by different types of viruses.Most viral infections are not serious and resolve on their own. However, some infections may cause severe symptoms and may lead to further complications. SYMPTOMS Viruses can frequently cause:  Minor sore throat.  Aches and pains.  Headaches.  Runny nose.  Different types of rashes.  Watery eyes.  Tiredness.  Cough.  Loss of appetite.  Gastrointestinal infections,  resulting in nausea, vomiting, and diarrhea. These symptoms do not respond to antibiotics because the infection is not caused by bacteria. However, you might catch a bacterial infection following the viral infection. This is sometimes called a "superinfection." Symptoms of such a bacterial infection may include:  Worsening sore throat with pus and difficulty swallowing.  Swollen neck  glands.  Chills and a high or persistent fever.  Severe headache.  Tenderness over the sinuses.  Persistent overall ill feeling (malaise), muscle aches, and tiredness (fatigue).  Persistent cough.  Yellow, green, or brown mucus production with coughing. HOME CARE INSTRUCTIONS   Only take over-the-counter or prescription medicines for pain, discomfort, diarrhea, or fever as directed by your caregiver.  Drink enough water and fluids to keep your urine clear or pale yellow. Sports drinks can provide valuable electrolytes, sugars, and hydration.  Get plenty of rest and maintain proper nutrition. Soups and broths with crackers or rice are fine. SEEK IMMEDIATE MEDICAL CARE IF:   You have severe headaches, shortness of breath, chest pain, neck pain, or an unusual rash.  You have uncontrolled vomiting, diarrhea, or you are unable to keep down fluids.  You or your child has an oral temperature above 102 F (38.9 C), not controlled by medicine.  Your baby is older than 3 months with a rectal temperature of 102 F (38.9 C) or higher.  Your baby is 53 months old or younger with a rectal temperature of 100.4 F (38 C) or higher. MAKE SURE YOU:   Understand these instructions.  Will watch your condition.  Will get help right away if you are not doing well or get worse.   This information is not intended to replace advice given to you by your health care provider. Make sure you discuss any questions you have with your health care provider.   Document Released: 10/05/2004 Document Revised: 03/20/2011 Document Reviewed: 06/03/2014 Elsevier Interactive Patient Education [employer]

## 2015-03-06 NOTE — ED Provider Notes (Signed)
CSN: [id number]     Arrival date & time 03/05/15  2236 History   First MD Initiated Contact with Patient 03/05/15 2350     Chief Complaint  Patient presents with  . Cough     (Consider location/radiation/quality/duration/timing/severity/associated sxs/prior Treatment) The history is provided by the patient and medical records.     Pt with hx HTN, CHF, obesity p/w cough,sneezing x 2 days.  Denies fevers, chills, nasal congestion, sore throat, CP, SOB, peripheral edema.  Denies recent travel, leg swelling, hx blood clots.  Pt tracks his weight and has lost weight recently.  Has had multiple sick contacts with cough.  Is taking all of his medications, recently taken off BiDil, no other changes.  Takes Lasix as directed.   Past Medical History  Diagnosis Date  . Nonischemic cardiomyopathy (HCC)     admitted 5/11 w acute systolic CHF. LHC showed no angiographic CAD. Echo (5/11) showed EF 15-20% w diffuse sever hypokinesis, moderate MR> RHC showed PCWP 10, CI 1.47 (Fick.) cause of CMP thought to be heavy ETOH and cocaine. No ICD yet due to ongoing ETOH intake  . ETOH abuse   . Cocaine abuse   . Active smoker /    1/2 ppd  . Obesity   . Hypertension   . CHF (congestive heart failure) (HCC)   . Gout   . GERD (gastroesophageal reflux disease)   . Sleep apnea   . 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 No past surgeries     Family History  Problem Relation Age of Onset  . Heart disease Mother   . Cancer Mother   . Heart disease Father    Social History  Substance Use Topics  . Smoking status: Current Every Day Smoker -- 0.75 packs/day for 38 years    Types: Cigarettes  . Smokeless tobacco: None     Comment: down to .5 pk.  about 1 week ago  . Alcohol Use: 7.2 oz/week    12 Shots of liquor per week   Comment: Vodka 1/2 pint - on/off.    Review of Systems  All other systems reviewed and are negative.     Allergies  Lisinopril; Shrimp; and Penicillins  Home Medications   Prior to Admission medications   Medication Sig Start Date End Date Taking? Authorizing Provider  aspirin (ASPIR-LOW) 81 MG EC tablet Take 81 mg by mouth daily.     Historical Provider, MD  carvedilol (COREG) 25 MG tablet Take 1 tablet (25 mg total) by mouth 2 (two) times daily. 02/10/15   Alexa Dulcy Fanny, MD  diphenhydrAMINE (BENADRYL) 25 MG tablet Take 25 mg by mouth every 6 (six) hours as needed for allergies.    Historical Provider, MD  EPINEPHrine (EPIPEN) 0.3 mg/0.3 mL SOAJ injection Inject 0.3 mL (0.3 mg total) into the muscle once as needed (anaphylaxis). Patient not taking: Reported on 07/21/2014 08/24/12   Leroy Sea, MD  furosemide (LASIX) 40 MG tablet Take 1.5 tablets (60 mg total) by mouth daily. 12/09/14   Gust Rung, DO  hydrocortisone (ANUSOL-HC) 2.5 % rectal cream Place 1 application rectally 2 (two) times daily. Patient not taking: Reported on 07/21/2014 10/22/13   Dow Adolph, MD  ibuprofen (ADVIL,MOTRIN) 600 MG tablet Take 1 tablet (600 mg total) by mouth every 6 (six) hours as  needed. 03/01/15   Otis Brace, MD  isosorbide-hydrALAZINE (BIDIL) 20-37.5 MG tablet Take 1 tablet by mouth 3 (three) times daily. 02/10/15   Alexa Dulcy Fanny, MD  losartan (COZAAR) 50 MG tablet TAKE 1 1/2 TABLET BY MOUTH EVERY DAY 04/20/14   Laurey Morale, MD  Multiple Vitamin (MULTIVITAMIN WITH MINERALS) TABS Take 1 tablet by mouth daily.    Historical Provider, MD  nicotine (NICODERM CQ - DOSED IN MG/24 HOURS) 14 mg/24hr patch Place 1 patch (14 mg total) onto the skin daily. 07/28/14   [name], MD  nicotine (NICODERM CQ - DOSED IN MG/24 HOURS) 21 mg/24hr patch Place 1 patch (21 mg total) onto the skin daily. 07/21/14   [name], MD  nicotine (NICODERM CQ - DOSED IN MG/24 HR) 7 mg/24hr patch Place 1  patch (7 mg total) onto the skin daily. 07/21/14   [name], MD  omeprazole (PRILOSEC) 40 MG capsule Take 1 capsule (40 mg total) by mouth daily. 02/10/15   Alexa Dulcy Fanny, MD  polyethylene glycol powder (GLYCOLAX/MIRALAX) powder Take 17 g by mouth daily. DISSOLVE 1 CAPFUL BY MOUTH DAILY AS DIRECTED 02/10/15   Alexa Dulcy Fanny, MD  spironolactone (ALDACTONE) 25 MG tablet Take 1 tablet (25 mg total) by mouth daily. 02/10/15   Alexa Dulcy Fanny, MD  terbinafine (LAMISIL) 1 % cream Apply 1 application topically 2 (two) times daily. Patient not taking: Reported on 07/21/2014 12/16/13   Alexa Dulcy Fanny, MD   BP 122/105 mmHg  Pulse 104  Temp(Src) 98.1 F (36.7 C) (Oral)  Resp 18  SpO2 93% Physical Exam  Constitutional: He appears well-developed and well-nourished. No distress.  HENT:  Head: Normocephalic and atraumatic.  Neck: Neck supple.  Cardiovascular: Normal rate and regular rhythm.   Pulmonary/Chest: Effort normal and breath sounds normal. No respiratory distress. He has no wheezes. He has no rales.  Abdominal: Soft. He exhibits no distension and no mass. There is no tenderness. There is no rebound and no guarding.  Musculoskeletal: He exhibits no edema.  Neurological: He is alert. He exhibits normal muscle tone.  Skin: He is not diaphoretic.  Nursing note and vitals reviewed.   ED Course  Procedures (including critical care time) Labs Review Labs Reviewed - No data to display  Imaging Review Dg Chest 2 View  03/06/2015  CLINICAL DATA:  Acute onset of cough and congestion. Initial encounter. EXAM: CHEST  2 VIEW COMPARISON:  Chest radiograph from 07/21/2014 FINDINGS: The lungs are well-aerated. Peribronchial thickening is noted. Mildly increased interstitial markings may reflect mild interstitial edema. There is no evidence of pleural effusion or pneumothorax. The heart is borderline enlarged. No acute osseous abnormalities are seen. IMPRESSION: Borderline cardiomegaly.  Peribronchial thickening noted. Mildly increased interstitial markings may reflect mild interstitial edema. Electronically Signed   By: Roanna Raider M.D.   On: 03/06/2015 00:51      EKG Interpretation None      MDM   Final diagnoses:  Viral respiratory infection    Afebrile, nontoxic patient with constellation of symptoms suggestive of viral syndrome.  No concerning findings on exam.  Discharged home with supportive care, PCP follow up.   CXR negative.  Doubt CHF, ACS, PE, PNA.  Pt feeling much better after neb treatment.  D/C home with symptomatic medications.  Discussed result, findings, treatment, and follow up  with patient.  Pt given return precautions.  Pt verbalizes understanding and agrees with plan.        Trixie Dredge, PA-C 03/06/15  1610  Dione Booze, MD 03/06/15 [phone]

## 2015-03-08 ENCOUNTER — Encounter (HOSPITAL_COMMUNITY): Payer: Self-pay | Admitting: *Deleted

## 2015-03-08 ENCOUNTER — Emergency Department (HOSPITAL_COMMUNITY)
Admission: EM | Admit: 2015-03-08 | Discharge: 2015-03-08 | Disposition: A | Discharge status: Home / Self Care | Payer: Medicaid Other | Attending: Emergency Medicine | Admitting: Emergency Medicine

## 2015-03-08 ENCOUNTER — Emergency Department (HOSPITAL_COMMUNITY): Payer: Medicaid Other

## 2015-03-08 DIAGNOSIS — F17210 Nicotine dependence, cigarettes, uncomplicated: Secondary | ICD-10-CM | POA: Insufficient documentation

## 2015-03-08 DIAGNOSIS — I10 Essential (primary) hypertension: Secondary | ICD-10-CM | POA: Insufficient documentation

## 2015-03-08 DIAGNOSIS — R531 Weakness: Secondary | ICD-10-CM | POA: Diagnosis present

## 2015-03-08 DIAGNOSIS — I509 Heart failure, unspecified: Secondary | ICD-10-CM | POA: Diagnosis not present

## 2015-03-08 DIAGNOSIS — R05 Cough: Secondary | ICD-10-CM | POA: Insufficient documentation

## 2015-03-08 DIAGNOSIS — E669 Obesity, unspecified: Secondary | ICD-10-CM | POA: Insufficient documentation

## 2015-03-08 NOTE — ED Notes (Signed)
Pt at desk stating he wants to leave due to feeling better, states he has an appointment with his MD on Thursday and will follow up them, ambulatory without distress

## 2015-03-08 NOTE — ED Notes (Addendum)
Pt states he was tx here for a cough on 2/24.  He states today he feels very weak.  He also just started taking tussinex syrup today and is feeling relief from his cough with the medicine.  Pt states dyspnea on exertion.

## 2015-03-09 ENCOUNTER — Telehealth: Payer: Self-pay | Admitting: *Deleted

## 2015-03-09 ENCOUNTER — Encounter (HOSPITAL_COMMUNITY): Payer: Self-pay

## 2015-03-09 ENCOUNTER — Inpatient Hospital Stay (HOSPITAL_COMMUNITY)
Admission: EM | Admit: 2015-03-09 | Discharge: 2015-03-12 | DRG: 292 | Disposition: A | Discharge status: Home / Self Care | Payer: Medicaid Other | Attending: Internal Medicine | Admitting: Internal Medicine

## 2015-03-09 ENCOUNTER — Emergency Department (HOSPITAL_COMMUNITY): Payer: Medicaid Other

## 2015-03-09 DIAGNOSIS — J208 Acute bronchitis due to other specified organisms: Secondary | ICD-10-CM | POA: Diagnosis present

## 2015-03-09 DIAGNOSIS — K219 Gastro-esophageal reflux disease without esophagitis: Secondary | ICD-10-CM | POA: Diagnosis present

## 2015-03-09 DIAGNOSIS — I10 Essential (primary) hypertension: Secondary | ICD-10-CM | POA: Diagnosis present

## 2015-03-09 DIAGNOSIS — R0600 Dyspnea, unspecified: Secondary | ICD-10-CM

## 2015-03-09 DIAGNOSIS — R062 Wheezing: Secondary | ICD-10-CM

## 2015-03-09 DIAGNOSIS — Z9119 Patient's noncompliance with other medical treatment and regimen: Secondary | ICD-10-CM

## 2015-03-09 DIAGNOSIS — Z7952 Long term (current) use of systemic steroids: Secondary | ICD-10-CM

## 2015-03-09 DIAGNOSIS — Z888 Allergy status to other drugs, medicaments and biological substances status: Secondary | ICD-10-CM

## 2015-03-09 DIAGNOSIS — I5043 Acute on chronic combined systolic (congestive) and diastolic (congestive) heart failure: Secondary | ICD-10-CM | POA: Diagnosis present

## 2015-03-09 DIAGNOSIS — Z79899 Other long term (current) drug therapy: Secondary | ICD-10-CM

## 2015-03-09 DIAGNOSIS — F17210 Nicotine dependence, cigarettes, uncomplicated: Secondary | ICD-10-CM | POA: Diagnosis present

## 2015-03-09 DIAGNOSIS — I509 Heart failure, unspecified: Secondary | ICD-10-CM

## 2015-03-09 DIAGNOSIS — Z720 Tobacco use: Secondary | ICD-10-CM

## 2015-03-09 DIAGNOSIS — M109 Gout, unspecified: Secondary | ICD-10-CM | POA: Diagnosis present

## 2015-03-09 DIAGNOSIS — Z880 Allergy status to penicillin: Secondary | ICD-10-CM

## 2015-03-09 DIAGNOSIS — F1410 Cocaine abuse, uncomplicated: Secondary | ICD-10-CM | POA: Diagnosis present

## 2015-03-09 DIAGNOSIS — R778 Other specified abnormalities of plasma proteins: Secondary | ICD-10-CM

## 2015-03-09 DIAGNOSIS — Z8249 Family history of ischemic heart disease and other diseases of the circulatory system: Secondary | ICD-10-CM

## 2015-03-09 DIAGNOSIS — F1010 Alcohol abuse, uncomplicated: Secondary | ICD-10-CM | POA: Diagnosis present

## 2015-03-09 DIAGNOSIS — I2510 Atherosclerotic heart disease of native coronary artery without angina pectoris: Secondary | ICD-10-CM | POA: Diagnosis present

## 2015-03-09 DIAGNOSIS — R7989 Other specified abnormal findings of blood chemistry: Secondary | ICD-10-CM

## 2015-03-09 DIAGNOSIS — R079 Chest pain, unspecified: Secondary | ICD-10-CM | POA: Diagnosis present

## 2015-03-09 DIAGNOSIS — J440 Chronic obstructive pulmonary disease with acute lower respiratory infection: Secondary | ICD-10-CM | POA: Diagnosis present

## 2015-03-09 DIAGNOSIS — Z6841 Body Mass Index (BMI) 40.0 and over, adult: Secondary | ICD-10-CM

## 2015-03-09 DIAGNOSIS — E119 Type 2 diabetes mellitus without complications: Secondary | ICD-10-CM | POA: Diagnosis present

## 2015-03-09 DIAGNOSIS — Z7982 Long term (current) use of aspirin: Secondary | ICD-10-CM

## 2015-03-09 DIAGNOSIS — I110 Hypertensive heart disease with heart failure: Principal | ICD-10-CM | POA: Diagnosis present

## 2015-03-09 DIAGNOSIS — R748 Abnormal levels of other serum enzymes: Secondary | ICD-10-CM | POA: Diagnosis present

## 2015-03-09 DIAGNOSIS — R05 Cough: Secondary | ICD-10-CM

## 2015-03-09 DIAGNOSIS — I429 Cardiomyopathy, unspecified: Secondary | ICD-10-CM

## 2015-03-09 DIAGNOSIS — R0789 Other chest pain: Secondary | ICD-10-CM

## 2015-03-09 DIAGNOSIS — G4733 Obstructive sleep apnea (adult) (pediatric): Secondary | ICD-10-CM | POA: Diagnosis present

## 2015-03-09 DIAGNOSIS — R058 Other specified cough: Secondary | ICD-10-CM

## 2015-03-09 DIAGNOSIS — J209 Acute bronchitis, unspecified: Secondary | ICD-10-CM | POA: Diagnosis present

## 2015-03-09 DIAGNOSIS — Z91013 Allergy to seafood: Secondary | ICD-10-CM

## 2015-03-09 LAB — CBC
HCT: 40.8 % (ref 39.0–52.0)
Hemoglobin: 13.4 g/dL (ref 13.0–17.0)
MCH: 29.9 pg (ref 26.0–34.0)
MCHC: 32.8 g/dL (ref 30.0–36.0)
MCV: 91.1 fL (ref 78.0–100.0)
PLATELETS: 277 10*3/uL (ref 150–400)
RBC: 4.48 MIL/uL (ref 4.22–5.81)
RDW: 14.2 % (ref 11.5–15.5)
WBC: 10.7 10*3/uL — AB (ref 4.0–10.5)

## 2015-03-09 LAB — TROPONIN I: Troponin I: 0.14 ng/mL — ABNORMAL HIGH (ref <0.031)

## 2015-03-09 LAB — I-STAT TROPONIN, ED: TROPONIN I, POC: 0.1 ng/mL — AB (ref 0.00–0.08)

## 2015-03-09 LAB — BASIC METABOLIC PANEL
Anion gap: 12 (ref 5–15)
BUN: 13 mg/dL (ref 6–20)
CALCIUM: 8.9 mg/dL (ref 8.9–10.3)
CO2: 28 mmol/L (ref 22–32)
CREATININE: 0.83 mg/dL (ref 0.61–1.24)
Chloride: 101 mmol/L (ref 101–111)
GFR calc non Af Amer: >60 mL/min (ref >60)
Glucose, Bld: 130 mg/dL — ABNORMAL HIGH (ref 65–99)
Potassium: 3.5 mmol/L (ref 3.5–5.1)
SODIUM: 141 mmol/L (ref 135–145)

## 2015-03-09 LAB — BRAIN NATRIURETIC PEPTIDE: B NATRIURETIC PEPTIDE 5: 685.3 pg/mL — AB (ref 0.0–100.0)

## 2015-03-09 MED ORDER — ALBUTEROL SULFATE HFA 108 (90 BASE) MCG/ACT IN AERS: 2.0000 | INHALATION_SPRAY | RESPIRATORY_TRACT | Status: DC | PRN

## 2015-03-09 MED ORDER — IPRATROPIUM BROMIDE 0.02 % IN SOLN
0.5000 mg | Freq: Once | RESPIRATORY_TRACT | Status: AC
  Administered 2015-03-09: 0.5 mg via RESPIRATORY_TRACT
  Filled 2015-03-09: qty 2.5

## 2015-03-09 MED ORDER — ALBUTEROL SULFATE (2.5 MG/3ML) 0.083% IN NEBU
5.0000 mg | INHALATION_SOLUTION | Freq: Once | RESPIRATORY_TRACT | Status: AC
  Administered 2015-03-09: 5 mg via RESPIRATORY_TRACT
  Filled 2015-03-09: qty 6

## 2015-03-09 MED ORDER — FUROSEMIDE 10 MG/ML IJ SOLN
60.0000 mg | Freq: Once | INTRAMUSCULAR | Status: AC
  Administered 2015-03-09: 60 mg via INTRAVENOUS
  Filled 2015-03-09: qty 6

## 2015-03-09 MED ORDER — HYDROCODONE-ACETAMINOPHEN 5-325 MG PO TABS
1.0000 | ORAL_TABLET | Freq: Once | ORAL | Status: AC
  Administered 2015-03-09: 1 via ORAL
  Filled 2015-03-09: qty 1

## 2015-03-09 MED ORDER — DOXYCYCLINE HYCLATE 100 MG PO CAPS: 100.0000 mg | ORAL_CAPSULE | Freq: Two times a day (BID) | ORAL | Status: DC

## 2015-03-09 NOTE — ED Notes (Signed)
Admitting at bedside 

## 2015-03-09 NOTE — ED Notes (Signed)
Pt sts he wants to leave AMA. MD aware.

## 2015-03-09 NOTE — ED Notes (Signed)
Pt. Coming from home via [facility] for SOB. Pt. Seen here 3 days in a row for same symptoms. Pt. sts he hasn't gotten any better. Pt. Started on prednisone and reports taking his dose today. Pt. Had rhonchi and expiratory wheezing in all lung fields with EMS. Pt. Given 5mg  albuterol tx. Pt. Lung fields slightly better with that tx per EMS. Pt. Coughing up thick mucus. Pt. Oxygen saturation 88% room air upon arrival.

## 2015-03-09 NOTE — ED Provider Notes (Addendum)
CSN: [id number]     Arrival date & time 03/09/15  1705 History   First MD Initiated Contact with Patient 03/09/15 1721     Chief Complaint  Patient presents with  . Shortness of Breath     (Consider location/radiation/quality/duration/timing/severity/associated sxs/prior Treatment) Patient is a 60 y.o. male presenting with shortness of breath. The history is provided by the patient and the EMS personnel.  Shortness of Breath Associated symptoms: chest pain, cough and wheezing   Associated symptoms: no abdominal pain, no neck pain, no rash, no sore throat and no vomiting   Patient with hx cardiomyopathy, ef 30-35% on prior ECHO, presents w increased cough in the past few days. Occasionally productive. No hemoptysis. No sore throat. Sob persistent, ?mildly worse today. Is a smoker and continues to smoke. Patient c/o intermittent chest heaviness/pressure, denies current chest pain or discomfort.  No wt increase  + mild edema. ?orthopnea. States compliant w normal meds. Subjective low grade fever.       Past Medical History  Diagnosis Date  . Nonischemic cardiomyopathy (HCC)     admitted 5/11 w acute systolic CHF. LHC showed no angiographic CAD. Echo (5/11) showed EF 15-20% w diffuse sever hypokinesis, moderate MR> RHC showed PCWP 10, CI 1.47 (Fick.) cause of CMP thought to be heavy ETOH and cocaine. No ICD yet due to ongoing ETOH intake  . ETOH abuse   . Cocaine abuse   . Active smoker /    1/2 ppd  . Obesity   . Hypertension   . CHF (congestive heart failure) (HCC)   . Gout   . GERD (gastroesophageal reflux disease)   . Sleep apnea   . 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 No past surgeries     Family History  Problem Relation Age of Onset  . Heart disease Mother   . Cancer Mother   . Heart disease  Father    Social History  Substance Use Topics  . Smoking status: Current Every Day Smoker -- 0.75 packs/day for 38 years    Types: Cigarettes  . Smokeless tobacco: None     Comment: down to .5 pk.  about 1 week ago  . Alcohol Use: 7.2 oz/week    12 Shots of liquor per week     Comment: Vodka 1/2 pint - on/off.    Review of Systems  Constitutional: Negative for chills.  HENT: Negative for sore throat.   Eyes: Negative for redness.  Respiratory: Positive for cough, shortness of breath and wheezing.   Cardiovascular: Positive for chest pain. Negative for leg swelling.  Gastrointestinal: Negative for vomiting, abdominal pain and diarrhea.  Endocrine: Negative for polyuria.  Genitourinary: Negative for dysuria and flank pain.  Musculoskeletal: Negative for back pain and neck pain.  Skin: Negative for rash.  Neurological: Negative for syncope, weakness and numbness.  Hematological: Does not bruise/bleed easily.  Psychiatric/Behavioral: Negative for confusion.      Allergies  Lisinopril; Shrimp; and Penicillins  Home Medications   Prior to Admission medications   Medication Sig Start Date End Date Taking? Authorizing Provider  aspirin (ASPIR-LOW) 81 MG EC tablet Take 81 mg by mouth daily.     Historical Provider, MD  carvedilol (COREG) 25 MG tablet Take 1 tablet (25 mg total) by mouth 2 (two) times daily. 02/10/15  Alexa Dulcy Fanny, MD  chlorpheniramine-HYDROcodone [facility] PENNKINETIC ER) 10-8 MG/5ML SUER Take 5 mLs by mouth at bedtime as needed for cough. 03/06/15   Trixie Dredge, PA-C  diphenhydrAMINE (BENADRYL) 25 MG tablet Take 25 mg by mouth every 6 (six) hours as needed for allergies.    Historical Provider, MD  EPINEPHrine (EPIPEN) 0.3 mg/0.3 mL SOAJ injection Inject 0.3 mL (0.3 mg total) into the muscle once as needed (anaphylaxis). Patient not taking: Reported on 07/21/2014 08/24/12   Leroy Sea, MD  furosemide (LASIX) 40 MG tablet Take 1.5 tablets (60 mg total) by  mouth daily. 12/09/14   Gust Rung, DO  hydrocortisone (ANUSOL-HC) 2.5 % rectal cream Place 1 application rectally 2 (two) times daily. Patient not taking: Reported on 07/21/2014 10/22/13   Dow Adolph, MD  ibuprofen (ADVIL,MOTRIN) 600 MG tablet Take 1 tablet (600 mg total) by mouth every 6 (six) hours as needed. 03/01/15   Otis Brace, MD  isosorbide-hydrALAZINE (BIDIL) 20-37.5 MG tablet Take 1 tablet by mouth 3 (three) times daily. 02/10/15   Alexa Dulcy Fanny, MD  losartan (COZAAR) 50 MG tablet TAKE 1 1/2 TABLET BY MOUTH EVERY DAY 04/20/14   Laurey Morale, MD  Multiple Vitamin (MULTIVITAMIN WITH MINERALS) TABS Take 1 tablet by mouth daily.    Historical Provider, MD  nicotine (NICODERM CQ - DOSED IN MG/24 HOURS) 14 mg/24hr patch Place 1 patch (14 mg total) onto the skin daily. 07/28/14   [name], MD  nicotine (NICODERM CQ - DOSED IN MG/24 HOURS) 21 mg/24hr patch Place 1 patch (21 mg total) onto the skin daily. 07/21/14   [name], MD  nicotine (NICODERM CQ - DOSED IN MG/24 HR) 7 mg/24hr patch Place 1 patch (7 mg total) onto the skin daily. 07/21/14   [name], MD  omeprazole (PRILOSEC) 40 MG capsule Take 1 capsule (40 mg total) by mouth daily. 02/10/15   Alexa Dulcy Fanny, MD  polyethylene glycol powder (GLYCOLAX/MIRALAX) powder Take 17 g by mouth daily. DISSOLVE 1 CAPFUL BY MOUTH DAILY AS DIRECTED 02/10/15   Alexa Dulcy Fanny, MD  predniSONE (STERAPRED UNI-PAK 21 TAB) 10 MG (21) TBPK tablet Take 1 tablet (10 mg total) by mouth daily. Day 1: take 6 tabs.  Day 2: 5 tabs  Day 3: 4 tabs  Day 4: 3 tabs  Day 5: 2 tabs  Day 6: 1 tab 03/06/15   Trixie Dredge, PA-C  spironolactone (ALDACTONE) 25 MG tablet Take 1 tablet (25 mg total) by mouth daily. 02/10/15   Alexa Dulcy Fanny, MD  terbinafine (LAMISIL) 1 % cream Apply 1 application topically 2 (two) times daily. Patient not taking: Reported on 07/21/2014 12/16/13   Alexa Dulcy Fanny, MD   BP 117/87 mmHg  Pulse 83  Temp(Src) 97.8 F (36.6  C) (Oral)  Resp 23  Ht 5\' 5"  (1.651 m)  Wt 119.75 kg  BMI 43.93 kg/m2  SpO2 92% Physical Exam  Constitutional: He is oriented to person, place, and time. He appears well-developed and well-nourished. No distress.  HENT:  Head: Atraumatic.  Mouth/Throat: Oropharynx is clear and moist.  Eyes: Conjunctivae are normal. No scleral icterus.  Neck: Neck supple. No JVD present. No tracheal deviation present.  Cardiovascular: Normal rate, regular rhythm, normal heart sounds and intact distal pulses.  Exam reveals no gallop and no friction rub.   No murmur heard. Pulmonary/Chest: Effort normal. No accessory muscle usage. No respiratory distress. He has wheezes.  Coughing, upper resp congestion. Rale bases. Wheezing. Mild.  Abdominal: Soft. Bowel sounds are normal. He exhibits no distension. There is no tenderness.  obese  Musculoskeletal: Normal range of motion. He exhibits no tenderness.  Mild bil ankle and lower leg edema.   Neurological: He is alert and oriented to person, place, and time.  Skin: Skin is warm and dry. He is not diaphoretic.  Psychiatric: He has a normal mood and affect.  Nursing note and vitals reviewed.   ED Course  Procedures (including critical care time) Labs Review   Results for orders placed or performed during the hospital encounter of 03/09/15  Basic metabolic panel  Result Value Ref Range   Sodium 141 135 - 145 mmol/L   Potassium 3.5 3.5 - 5.1 mmol/L   Chloride 101 101 - 111 mmol/L   CO2 28 22 - 32 mmol/L   Glucose, Bld 130 (H) 65 - 99 mg/dL   BUN 13 6 - 20 mg/dL   Creatinine, Ser 1.61 0.61 - 1.24 mg/dL   Calcium 8.9 8.9 - 09.6 mg/dL   GFR calc non Af Amer >60 >60 mL/min   GFR calc Af Amer >60 >60 mL/min   Anion gap 12 5 - 15  CBC  Result Value Ref Range   WBC 10.7 (H) 4.0 - 10.5 K/uL   RBC 4.48 4.22 - 5.81 MIL/uL   Hemoglobin 13.4 13.0 - 17.0 g/dL   HCT 04.5 40.9 - 81.1 %   MCV 91.1 78.0 - 100.0 fL   MCH 29.9 26.0 - 34.0 pg   MCHC 32.8 30.0  - 36.0 g/dL   RDW 91.4 78.2 - 95.6 %   Platelets 277 150 - 400 K/uL  Brain natriuretic peptide  Result Value Ref Range   B Natriuretic Peptide 685.3 (H) 0.0 - 100.0 pg/mL  I-stat troponin, ED (not at [facility])  Result Value Ref Range   Troponin i, poc 0.10 (HH) 0.00 - 0.08 ng/mL   Comment NOTIFIED PHYSICIAN    Comment 3           Dg Chest 2 View  03/09/2015  CLINICAL DATA:  Pt is having SOB, chest pain and slight dizziness x 3 days.Hx of CHF, HTN, nonischemic cardiomyopathy, ETOH abuse, smoker, obesity sleep apnea EXAM: CHEST  2 VIEW COMPARISON:  03/08/2015 FINDINGS: Mild cardiomegaly. No mediastinal or hilar masses or evidence of adenopathy. Diffuse peribronchial thickening without change from the prior study. No lung consolidation or overt edema. No pleural effusion or pneumothorax. Skeletal structures are intact. IMPRESSION: 1. No acute cardiopulmonary disease. No change from the previous day's study. Electronically Signed   By: Amie Portland M.D.   On: 03/09/2015 18:14        I've personally reviewed and evaluated these images and lab results as part of my medical decision-making.   EKG Interpretation   Date/Time:  Tuesday March 09 2015 17:05:34 EST Ventricular Rate:  88 PR Interval:  140 QRS Duration: 90 QT Interval:  392 QTC Calculation: 474 R Axis:   34 Text Interpretation:  Sinus rhythm Multiform ventricular premature  complexes Nonspecific T abnormalities, lateral leads No significant change  since last tracing Confirmed by Denton Lank  MD, Caryn Bee ([id number]) on 03/09/2015  5:21:36 PM      MDM   Iv ns. Continuous pulse ox and monitor. o2 [place].   Labs. Cxr.  Reviewed nursing notes and prior charts for additional history.   BNP increased as compared to prior checks. Dyspnea. Will give dose lasix iv.  Trop mildly elevated.  Given progressive dyspnea, elevated  trop, elev bnp - will admit.   Persistent wheezing. Additional alb and atrovent neb.  Feel dyspnea likely  multifactorial, related to uri, bronchitis vs other viral/flu, copd, cm/chf. Also will need serial trop given mildly elev trop.  No current cp.   Pts pcp service contacted - will admit.  After plans for admit, pt indicates he wants to sign out AMA.  Pt is alert, oriented. I discussed with patient reasons for admission, his high trop level, and breathing/wheezing, and risks of leaving, including worsening of his breathing, risk of heart attack, permanent heart damage, breathing failure/resp arrest, and death.   Patient expresses his understanding of my concerns and risks of leaving, and requests he be disconnected from iv/monitor so that he be allowed to leave.    Pt subsequently indicates has re-considered and now is willing to stay for admission.      Cathren Laine, MD 03/09/15 2121

## 2015-03-09 NOTE — Telephone Encounter (Signed)
Called pt - talked to his daughter who stated pt was not home. Asked about scheduling sleep study appt - stated it has been done;informed her by looking at his chart, I do not see an appt then she stated  pt has the number and will schedule himself.

## 2015-03-10 ENCOUNTER — Inpatient Hospital Stay (HOSPITAL_COMMUNITY): Payer: Medicaid Other

## 2015-03-10 DIAGNOSIS — Z880 Allergy status to penicillin: Secondary | ICD-10-CM | POA: Diagnosis not present

## 2015-03-10 DIAGNOSIS — I5023 Acute on chronic systolic (congestive) heart failure: Secondary | ICD-10-CM | POA: Diagnosis not present

## 2015-03-10 DIAGNOSIS — F17210 Nicotine dependence, cigarettes, uncomplicated: Secondary | ICD-10-CM | POA: Diagnosis present

## 2015-03-10 DIAGNOSIS — Z7982 Long term (current) use of aspirin: Secondary | ICD-10-CM

## 2015-03-10 DIAGNOSIS — K219 Gastro-esophageal reflux disease without esophagitis: Secondary | ICD-10-CM | POA: Diagnosis present

## 2015-03-10 DIAGNOSIS — R079 Chest pain, unspecified: Secondary | ICD-10-CM | POA: Diagnosis present

## 2015-03-10 DIAGNOSIS — Z7952 Long term (current) use of systemic steroids: Secondary | ICD-10-CM | POA: Diagnosis not present

## 2015-03-10 DIAGNOSIS — I509 Heart failure, unspecified: Secondary | ICD-10-CM

## 2015-03-10 DIAGNOSIS — I429 Cardiomyopathy, unspecified: Secondary | ICD-10-CM | POA: Diagnosis present

## 2015-03-10 DIAGNOSIS — J209 Acute bronchitis, unspecified: Secondary | ICD-10-CM | POA: Diagnosis present

## 2015-03-10 DIAGNOSIS — R748 Abnormal levels of other serum enzymes: Secondary | ICD-10-CM | POA: Diagnosis present

## 2015-03-10 DIAGNOSIS — Z79899 Other long term (current) drug therapy: Secondary | ICD-10-CM

## 2015-03-10 DIAGNOSIS — I5043 Acute on chronic combined systolic (congestive) and diastolic (congestive) heart failure: Secondary | ICD-10-CM | POA: Diagnosis present

## 2015-03-10 DIAGNOSIS — F1010 Alcohol abuse, uncomplicated: Secondary | ICD-10-CM | POA: Diagnosis present

## 2015-03-10 DIAGNOSIS — J208 Acute bronchitis due to other specified organisms: Secondary | ICD-10-CM | POA: Diagnosis present

## 2015-03-10 DIAGNOSIS — M109 Gout, unspecified: Secondary | ICD-10-CM | POA: Diagnosis present

## 2015-03-10 DIAGNOSIS — I2510 Atherosclerotic heart disease of native coronary artery without angina pectoris: Secondary | ICD-10-CM

## 2015-03-10 DIAGNOSIS — I110 Hypertensive heart disease with heart failure: Secondary | ICD-10-CM | POA: Diagnosis present

## 2015-03-10 DIAGNOSIS — Z888 Allergy status to other drugs, medicaments and biological substances status: Secondary | ICD-10-CM | POA: Diagnosis not present

## 2015-03-10 DIAGNOSIS — F1410 Cocaine abuse, uncomplicated: Secondary | ICD-10-CM | POA: Diagnosis present

## 2015-03-10 DIAGNOSIS — E119 Type 2 diabetes mellitus without complications: Secondary | ICD-10-CM | POA: Diagnosis present

## 2015-03-10 DIAGNOSIS — Z91013 Allergy to seafood: Secondary | ICD-10-CM | POA: Diagnosis not present

## 2015-03-10 DIAGNOSIS — R062 Wheezing: Secondary | ICD-10-CM | POA: Diagnosis not present

## 2015-03-10 DIAGNOSIS — Z6841 Body Mass Index (BMI) 40.0 and over, adult: Secondary | ICD-10-CM | POA: Diagnosis not present

## 2015-03-10 DIAGNOSIS — R7989 Other specified abnormal findings of blood chemistry: Secondary | ICD-10-CM

## 2015-03-10 DIAGNOSIS — G4733 Obstructive sleep apnea (adult) (pediatric): Secondary | ICD-10-CM | POA: Diagnosis present

## 2015-03-10 DIAGNOSIS — J440 Chronic obstructive pulmonary disease with acute lower respiratory infection: Secondary | ICD-10-CM | POA: Diagnosis present

## 2015-03-10 DIAGNOSIS — R778 Other specified abnormalities of plasma proteins: Secondary | ICD-10-CM | POA: Insufficient documentation

## 2015-03-10 DIAGNOSIS — I340 Nonrheumatic mitral (valve) insufficiency: Secondary | ICD-10-CM | POA: Diagnosis not present

## 2015-03-10 DIAGNOSIS — Z8249 Family history of ischemic heart disease and other diseases of the circulatory system: Secondary | ICD-10-CM | POA: Diagnosis not present

## 2015-03-10 DIAGNOSIS — Z9119 Patient's noncompliance with other medical treatment and regimen: Secondary | ICD-10-CM | POA: Diagnosis not present

## 2015-03-10 LAB — BASIC METABOLIC PANEL
Anion gap: 14 (ref 5–15)
BUN: 12 mg/dL (ref 6–20)
CHLORIDE: 93 mmol/L — AB (ref 101–111)
CO2: 31 mmol/L (ref 22–32)
CREATININE: 0.98 mg/dL (ref 0.61–1.24)
Calcium: 9.4 mg/dL (ref 8.9–10.3)
GFR calc non Af Amer: >60 mL/min (ref >60)
GLUCOSE: 163 mg/dL — AB (ref 65–99)
Potassium: 3.4 mmol/L — ABNORMAL LOW (ref 3.5–5.1)
Sodium: 138 mmol/L (ref 135–145)

## 2015-03-10 LAB — RAPID URINE DRUG SCREEN, HOSP PERFORMED
AMPHETAMINES: NOT DETECTED
Barbiturates: NOT DETECTED
Benzodiazepines: NOT DETECTED
COCAINE: NOT DETECTED
OPIATES: POSITIVE — AB
TETRAHYDROCANNABINOL: POSITIVE — AB

## 2015-03-10 LAB — INFLUENZA PANEL BY PCR (TYPE A & B)
H1N1 flu by pcr: NOT DETECTED
INFLAPCR: NEGATIVE
INFLBPCR: NEGATIVE

## 2015-03-10 LAB — CBC
HCT: 45.6 % (ref 39.0–52.0)
Hemoglobin: 15.6 g/dL (ref 13.0–17.0)
MCH: 31 pg (ref 26.0–34.0)
MCHC: 34.2 g/dL (ref 30.0–36.0)
MCV: 90.7 fL (ref 78.0–100.0)
PLATELETS: 298 10*3/uL (ref 150–400)
RBC: 5.03 MIL/uL (ref 4.22–5.81)
RDW: 14.1 % (ref 11.5–15.5)
WBC: 11.1 10*3/uL — ABNORMAL HIGH (ref 4.0–10.5)

## 2015-03-10 LAB — TROPONIN I
TROPONIN I: 0.17 ng/mL — AB (ref <0.031)
Troponin I: 0.16 ng/mL — ABNORMAL HIGH (ref <0.031)
Troponin I: 0.16 ng/mL — ABNORMAL HIGH (ref <0.031)

## 2015-03-10 LAB — GLUCOSE, CAPILLARY: GLUCOSE-CAPILLARY: 152 mg/dL — AB (ref 65–99)

## 2015-03-10 MED ORDER — ASPIRIN EC 81 MG PO TBEC
81.0000 mg | DELAYED_RELEASE_TABLET | Freq: Every day | ORAL | Status: DC
  Administered 2015-03-10 – 2015-03-12 (×3): 81 mg via ORAL
  Filled 2015-03-10 (×3): qty 1

## 2015-03-10 MED ORDER — PERFLUTREN LIPID MICROSPHERE
1.0000 mL | INTRAVENOUS | Status: AC | PRN
  Administered 2015-03-10: 3 mL via INTRAVENOUS
  Filled 2015-03-10: qty 10

## 2015-03-10 MED ORDER — LORAZEPAM 1 MG PO TABS: 1.0000 mg | ORAL_TABLET | Freq: Four times a day (QID) | ORAL | Status: DC | PRN

## 2015-03-10 MED ORDER — FUROSEMIDE 40 MG PO TABS: 60.0000 mg | ORAL_TABLET | Freq: Every day | ORAL | Status: DC

## 2015-03-10 MED ORDER — PREDNISONE 10 MG (21) PO TBPK: 10.0000 mg | ORAL_TABLET | Freq: Every day | ORAL | Status: DC

## 2015-03-10 MED ORDER — IPRATROPIUM-ALBUTEROL 0.5-2.5 (3) MG/3ML IN SOLN
3.0000 mL | Freq: Two times a day (BID) | RESPIRATORY_TRACT | Status: DC
  Administered 2015-03-11 – 2015-03-12 (×3): 3 mL via RESPIRATORY_TRACT
  Filled 2015-03-10 (×3): qty 3

## 2015-03-10 MED ORDER — VITAMIN B-1 100 MG PO TABS
100.0000 mg | ORAL_TABLET | Freq: Every day | ORAL | Status: DC
  Administered 2015-03-10 – 2015-03-12 (×3): 100 mg via ORAL
  Filled 2015-03-10 (×3): qty 1

## 2015-03-10 MED ORDER — PREDNISONE 10 MG PO TABS
30.0000 mg | ORAL_TABLET | Freq: Every day | ORAL | Status: AC
  Administered 2015-03-11: 30 mg via ORAL
  Filled 2015-03-10: qty 1

## 2015-03-10 MED ORDER — PANTOPRAZOLE SODIUM 40 MG PO TBEC
40.0000 mg | DELAYED_RELEASE_TABLET | Freq: Every day | ORAL | Status: DC
  Administered 2015-03-10 – 2015-03-12 (×3): 40 mg via ORAL
  Filled 2015-03-10 (×3): qty 1

## 2015-03-10 MED ORDER — FUROSEMIDE 10 MG/ML IJ SOLN
60.0000 mg | Freq: Two times a day (BID) | INTRAMUSCULAR | Status: DC
  Administered 2015-03-10 (×2): 60 mg via INTRAVENOUS
  Filled 2015-03-10 (×2): qty 6

## 2015-03-10 MED ORDER — PREDNISONE 10 MG PO TABS: 10.0000 mg | ORAL_TABLET | Freq: Every day | ORAL | Status: DC

## 2015-03-10 MED ORDER — PREDNISONE 20 MG PO TABS
40.0000 mg | ORAL_TABLET | Freq: Every day | ORAL | Status: AC
  Administered 2015-03-10: 40 mg via ORAL
  Filled 2015-03-10: qty 2

## 2015-03-10 MED ORDER — FOLIC ACID 1 MG PO TABS
1.0000 mg | ORAL_TABLET | Freq: Every day | ORAL | Status: DC
  Administered 2015-03-10 – 2015-03-12 (×3): 1 mg via ORAL
  Filled 2015-03-10 (×3): qty 1

## 2015-03-10 MED ORDER — LOSARTAN POTASSIUM 50 MG PO TABS
75.0000 mg | ORAL_TABLET | Freq: Every day | ORAL | Status: DC
  Administered 2015-03-10: 75 mg via ORAL
  Filled 2015-03-10: qty 1

## 2015-03-10 MED ORDER — THIAMINE HCL 100 MG/ML IJ SOLN
100.0000 mg | Freq: Every day | INTRAMUSCULAR | Status: DC
Start: 2015-03-10 — End: 2015-03-10

## 2015-03-10 MED ORDER — POTASSIUM CHLORIDE CRYS ER 20 MEQ PO TBCR
30.0000 meq | EXTENDED_RELEASE_TABLET | Freq: Once | ORAL | Status: AC
  Administered 2015-03-10: 30 meq via ORAL
  Filled 2015-03-10: qty 1

## 2015-03-10 MED ORDER — ADULT MULTIVITAMIN W/MINERALS CH
1.0000 | ORAL_TABLET | Freq: Every day | ORAL | Status: DC
Start: 2015-03-10 — End: 2015-03-12
  Administered 2015-03-10 – 2015-03-12 (×3): 1 via ORAL
  Filled 2015-03-10 (×3): qty 1

## 2015-03-10 MED ORDER — PREDNISONE 20 MG PO TABS
20.0000 mg | ORAL_TABLET | Freq: Every day | ORAL | Status: AC
  Administered 2015-03-12: 20 mg via ORAL
  Filled 2015-03-10: qty 1

## 2015-03-10 MED ORDER — LORAZEPAM 2 MG/ML IJ SOLN: 1.0000 mg | Freq: Four times a day (QID) | INTRAMUSCULAR | Status: DC | PRN

## 2015-03-10 MED ORDER — IPRATROPIUM-ALBUTEROL 0.5-2.5 (3) MG/3ML IN SOLN
3.0000 mL | Freq: Four times a day (QID) | RESPIRATORY_TRACT | Status: DC
  Administered 2015-03-10 (×3): 3 mL via RESPIRATORY_TRACT
  Filled 2015-03-10 (×4): qty 3

## 2015-03-10 MED ORDER — LOSARTAN POTASSIUM 50 MG PO TABS
100.0000 mg | ORAL_TABLET | Freq: Every day | ORAL | Status: DC
  Administered 2015-03-11 – 2015-03-12 (×2): 100 mg via ORAL
  Filled 2015-03-10 (×2): qty 2

## 2015-03-10 MED ORDER — CARVEDILOL 25 MG PO TABS
25.0000 mg | ORAL_TABLET | Freq: Two times a day (BID) | ORAL | Status: DC
  Administered 2015-03-10 – 2015-03-12 (×5): 25 mg via ORAL
  Filled 2015-03-10 (×5): qty 1
  Filled 2015-03-10: qty 4

## 2015-03-10 MED ORDER — ENOXAPARIN SODIUM 60 MG/0.6ML ~~LOC~~ SOLN
60.0000 mg | SUBCUTANEOUS | Status: DC
  Administered 2015-03-11: 60 mg via SUBCUTANEOUS
  Filled 2015-03-10: qty 0.6

## 2015-03-10 MED ORDER — SPIRONOLACTONE 25 MG PO TABS
25.0000 mg | ORAL_TABLET | Freq: Every day | ORAL | Status: DC
  Administered 2015-03-10 – 2015-03-12 (×3): 25 mg via ORAL
  Filled 2015-03-10 (×4): qty 1

## 2015-03-10 NOTE — H&P (Signed)
Date: 03/10/2015               Patient Name:  Ethan Wilcox MRN: [id number]  DOB: February 22, 1955 Age / Sex: 60 y.o., male   PCP: [name], MD         Medical Service: Internal Medicine Teaching Service         Attending Physician: Dr. Doneen Poisson, MD    First Contact: Dr. Allena Katz  Pager: [phone]  Second Contact: Dr. Isabella Bowens Pager: [phone]       After Hours (After 5p/  First Contact Pager: [phone]  weekends / holidays): Second Contact Pager: [phone]   Chief Complaint: cough and wheezing   History of Present Illness: Patient is a 60 yo M with a PMHx of non-ischemic cardiomyopathy, CHF, HTN, GERD, OSA, ethanol abuse, and cocaine abuse presenting to the hospital with a 3 day history of cough, increased sputum production (brown colored) and wheezing. States he experiences substernal "burning pain" every time he coughs. Reports feeling feeling weak and SOB even when walking short distances (half a block). Patient believes he has been having fevers but no chills. Denies having any CP, palpitations, or diaphoresis. Denies any recent travel, history of blood clots, and leg/ calf pain or swelling.  He does report having sick contacts at home (grandkids).  Denies having any GERD symptoms. Reports having a "choking sensation" at night which wakes him up from sleep. He has a history of OSA and endorses non-adherence to CPAP. States he felt much better after receiving breathing treatments in the ED.   Patient recently visited the ED on 03/06/15 with a constellation of symptoms suggestive of a viral syndrome. He was discharged home with supportive care.    Meds: Current Facility-Administered Medications  Medication Dose Route Frequency Provider Last Rate Last Dose  . aspirin EC tablet 81 mg  81 mg Oral Daily Ejiroghene E Emokpae, MD      . carvedilol (COREG) tablet 25 mg  25 mg Oral BID WC Ejiroghene E Emokpae, MD      . enoxaparin (LOVENOX) injection 60 mg  60 mg Subcutaneous Q24H Ejiroghene E  Emokpae, MD      . furosemide (LASIX) tablet 60 mg  60 mg Oral Daily Ejiroghene E Emokpae, MD      . ipratropium-albuterol (DUONEB) 0.5-2.5 (3) MG/3ML nebulizer solution 3 mL  3 mL Nebulization Q6H Ejiroghene E Emokpae, MD      . losartan (COZAAR) tablet 75 mg  75 mg Oral Daily Ejiroghene E Emokpae, MD      . pantoprazole (PROTONIX) EC tablet 40 mg  40 mg Oral Daily Ejiroghene E Emokpae, MD      . predniSONE (DELTASONE) tablet 40 mg  40 mg Oral Q breakfast Ejiroghene Wendall Stade, MD       Followed by  . [START ON 03/11/2015] predniSONE (DELTASONE) tablet 30 mg  30 mg Oral Q breakfast Ejiroghene Wendall Stade, MD       Followed by  . [START ON 03/12/2015] predniSONE (DELTASONE) tablet 20 mg  20 mg Oral Q breakfast Ejiroghene Wendall Stade, MD       Followed by  . [START ON 03/13/2015] predniSONE (DELTASONE) tablet 10 mg  10 mg Oral Q breakfast Ejiroghene E Emokpae, MD      . spironolactone (ALDACTONE) tablet 25 mg  25 mg Oral Daily Ejiroghene Wendall Stade, MD        Allergies: Allergies as of 03/09/2015 - Review Complete 03/09/2015  Allergen Reaction Noted  .  Lisinopril Anaphylaxis 08/28/2012  . Shrimp [shellfish allergy] Anaphylaxis 10/19/2012  . Penicillins Other (See Comments) 06/04/2009   Past Medical History  Diagnosis Date  . Nonischemic cardiomyopathy (HCC)     admitted 5/11 w acute systolic CHF. LHC showed no angiographic CAD. Echo (5/11) showed EF 15-20% w diffuse sever hypokinesis, moderate MR> RHC showed PCWP 10, CI 1.47 (Fick.) cause of CMP thought to be heavy ETOH and cocaine. No ICD yet due to ongoing ETOH intake  . ETOH abuse   . Cocaine abuse   . Active smoker /    1/2 ppd  . Obesity   . Hypertension   . CHF (congestive heart failure) (HCC)   . Gout   . GERD (gastroesophageal reflux disease)   . Sleep apnea   . 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 No past surgeries     Family History  Problem Relation Age of Onset  . Heart disease Mother   . Cancer Mother   . Heart disease Father    Social History   Social History  . Marital Status: Single    Spouse Name: N/A  . Number of Children: N/A  . Years of Education: N/A   Occupational History  . DISABLED    Social History Main Topics  . Smoking status: Current Every Day Smoker -- 0.75 packs/day for 38 years    Types: Cigarettes  . Smokeless tobacco: Not on file     Comment: down to .5 pk.  about 1 week ago  . Alcohol Use: 7.2 oz/week    12 Shots of liquor per week     Comment: Vodka 1/2 pint - on/off.  . Drug Use: No     Comment: former  . Sexual Activity: Not on file   Other Topics Concern  . Not on file   Social History Narrative   Works as Gaffer. 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 Heavy ETOH in past, now back to drinking 1/2 pint/week.          No show 07/16/09    Review of Systems: Review of Systems  Constitutional: Positive for fever and malaise/fatigue. Negative for chills.  HENT: Negative for congestion and sore throat.   Eyes: Negative for blurred vision and pain.  Respiratory: Positive for cough, sputum production, shortness of breath and wheezing.   Cardiovascular: Positive for orthopnea. Negative for chest pain, palpitations and leg swelling.  Gastrointestinal: Negative for heartburn, nausea, vomiting, abdominal pain, diarrhea and constipation.  Genitourinary: Negative for dysuria, urgency and frequency.  Musculoskeletal: Negative for myalgias and joint pain.  Skin: Negative for itching and rash.  Neurological: Positive for headaches. Negative for dizziness, sensory change and focal weakness.    Physical Exam: Blood pressure 144/80, pulse 100, temperature 98.9 F (37.2 C), temperature source Oral, resp. rate 20, height 5\' 5"  (1.651 m), weight 119.75 kg (264 lb), SpO2 92 %. Physical Exam    Constitutional: He is oriented to person, place, and time. He appears well-developed and well-nourished. No distress.  African American male lying comfortably in hospital stretcher. Obese.   HENT:  Head: Normocephalic and atraumatic.  Mouth/Throat: Oropharynx is clear and moist.  Eyes: EOM are normal. Pupils are equal, round, and reactive to light.  Neck: Neck supple. No tracheal deviation present.  Cardiovascular: Normal rate, regular rhythm and intact  distal pulses.  Exam reveals no gallop and no friction rub.   No murmur heard. Pulmonary/Chest: Effort normal. No respiratory distress. He has no wheezes. He has no rales.  Rhonchi appreciated at the bases bilaterally   Abdominal: Soft. Bowel sounds are normal. He exhibits no distension. There is no tenderness.  Musculoskeletal: Normal range of motion. He exhibits no edema.  Neurological: He is alert and oriented to person, place, and time.  Skin: Skin is warm and dry. No rash noted. He is not diaphoretic. No erythema.    Lab results: Basic Metabolic Panel:  Recent Labs  60/45/40 1749  NA 141  K 3.5  CL 101  CO2 28  GLUCOSE 130*  BUN 13  CREATININE 0.83  CALCIUM 8.9   CBC:  Recent Labs  03/09/15 1749  WBC 10.7*  HGB 13.4  HCT 40.8  MCV 91.1  PLT 277   Cardiac Enzymes:  Recent Labs  03/09/15 1900  TROPONINI 0.14*   CBG:  Recent Labs  03/10/15 0132  GLUCAP 152*   Urine Drug Screen: Drugs of Abuse     Component Value Date/Time   LABOPIA NONE DETECTED 02/22/2013 1841   COCAINSCRNUR NONE DETECTED 02/22/2013 1841   LABBENZ NONE DETECTED 02/22/2013 1841   AMPHETMU NONE DETECTED 02/22/2013 1841   THCU POSITIVE* 02/22/2013 1841   LABBARB NONE DETECTED 02/22/2013 1841   Imaging results:  Dg Chest 2 View  03/09/2015  CLINICAL DATA:  Pt is having SOB, chest pain and slight dizziness x 3 days.Hx of CHF, HTN, nonischemic cardiomyopathy, ETOH abuse, smoker, obesity sleep apnea EXAM: CHEST  2 VIEW COMPARISON:   03/08/2015 FINDINGS: Mild cardiomegaly. No mediastinal or hilar masses or evidence of adenopathy. Diffuse peribronchial thickening without change from the prior study. No lung consolidation or overt edema. No pleural effusion or pneumothorax. Skeletal structures are intact. IMPRESSION: 1. No acute cardiopulmonary disease. No change from the previous day's study. Electronically Signed   By: Amie Portland M.D.   On: 03/09/2015 18:14   Dg Chest 2 View  03/08/2015  CLINICAL DATA:  Pt states he was tx here for a cough on 2/24. He states today he feels very weak. He also just started taking tussinex syrup today and is feeling relief from his cough with the medicine. Pt states dyspnea on exertion, hx: CHF, cardiomyopathy EXAM: CHEST  2 VIEW COMPARISON:  03/05/2015 FINDINGS: Midline trachea. Mild cardiomegaly. Mediastinal contours otherwise within normal limits. No pleural effusion or pneumothorax. Low lung volumes with resultant pulmonary interstitial prominence. No congestive failure. Diffuse peribronchial thickening. No lobar consolidation. IMPRESSION: Mild cardiomegaly and low lung volumes. No overt congestive failure or acute disease. Peribronchial thickening which may relate to chronic bronchitis or smoking. Electronically Signed   By: Jeronimo Greaves M.D.   On: 03/08/2015 19:41    Other results: EKG: Normal rate (70 bpm). Sinus rhythm with frequent PVCs.  Assessment & Plan by Problem: Active Problems:   Acute bronchitis  Acute bronchitis Patient is presenting with a 3 day history of subjective fevers, cough, sputum production, wheezing, and SOB. He did have recent sick contacts at home. He was satting 92% on 2L O2 via [address]. White count elevated at 10.7 and was febrile (100.2), however, CXR did not show any acute abnormality making pneumonia less likely. Lung exam remarkable for rhonchi at the bases bilaterally.   -Admit to telemetry  -Influenza panel pending -Duoneb q6 prn  -Prednisone taper started by  ED on the 25th. We will continue the treatment  for now.    Chest pain  Patient reports having chest burning/ pain with cough. EKG is not showing any acute ST or T wave changes. Troponin elevated at 0.14 which is concerning for ACS since he has risk factors including DM, smoking, and HTN. Well's score 0 making PE less likely.  -Continue to trend troponin   CAD Cardiac cath done in 2011 showing normal coronary arteries. -Aspirin 81 mg daily  -Coreg 25 mg BID -Losartan 75 mg daily   CHF Echo from 05/2013 showing left ventricular hypertrophy, ejection fraction 30-35%, diffuse hypokinesis, grade 1 diastolic dysfunction, mild mitral regurgitation, and moderate to severe dilation of the left atrium. Patient is taking all his heart failure medications except Bidil. BNP is elevated at 685.3, however, patient does not appear fluid overloaded on exam- no crackles heard on lung exam and no lower extremity edema. -Lasix 60 mg daily  -Spironolactone 25 mg daily  -Coreg 25 mg BID -Losartan 75 mg daily   GERD -Protonix 40 mg daily   Diet: 2 gm sodium   DVT/PE ppx: Lovenox   Dispo: Disposition is deferred at this time, awaiting improvement of current medical problems. Anticipated discharge in approximately 2-3 day(s).   The patient does have a current PCP [name], MD) and does need an [facility] hospital follow-up appointment after discharge.  The patient does not have transportation limitations that hinder transportation to clinic appointments.  Signed: John Giovanni, MD 03/10/2015, 2:01 AM

## 2015-03-10 NOTE — Progress Notes (Signed)
  Echocardiogram 2D Echocardiogram has been performed.  Ethan Wilcox 03/10/2015, 3:06 PM

## 2015-03-10 NOTE — Progress Notes (Signed)
Report given to oncoming nurse.  All questions answered/

## 2015-03-10 NOTE — Progress Notes (Signed)
Patient has persistent cough with occasional sputum production. Dyspnea with coughing. MD paged and awaiting response.

## 2015-03-10 NOTE — Progress Notes (Signed)
Subjective: Reports dyspnea this morning but improved overall. He reports abdominal discomfort and denies chest pain. He has orthopnea but denies PND.  Objective: Vital signs in last 24 hours: Filed Vitals:   03/10/15 0134 03/10/15 0350 03/10/15 0452 03/10/15 0904  BP: 144/80 131/82 150/90   Pulse: 100 102 99   Temp: 98.9 F (37.2 C) 100.2 F (37.9 C) 99 F (37.2 C)   TempSrc:  Oral Oral   Resp: Height:    (1.676 m)   Weight:   253 lb 14.4 oz (115.168 kg)   SpO2: 92% 96% 94% 95%   Weight change:   Intake/Output Summary (Last 24 hours) at 03/10/15 1101 Last data filed at 03/10/15 0609  Gross per 24 hour  Intake      0 ml  Output   1325 ml  Net  -1325 ml   General Apperance: NAD HEENT: Normocephalic, atraumatic, anicteric sclera Neck: Supple, trachea midline, unable to evaluate JVD due to body habitus Lungs: Wheezes and rales in bilateral bases. Heart: Regular rate and rhythm, no murmur/rub/gallop Abdomen: Soft, nontender, distended, no rebound/guarding Extremities: Warm and well perfused, no edema Skin: No rashes or lesions Neurologic: Alert and interactive. No gross deficits.  Lab Results: Basic Metabolic Panel:  Recent Labs Lab 03/09/15 1749  NA 141  K 3.5  CL 101  CO2 28  GLUCOSE 130*  BUN 13  CREATININE 0.83  CALCIUM 8.9   CBC:  Recent Labs Lab 03/09/15 1749  WBC 10.7*  HGB 13.4  HCT 40.8  MCV 91.1  PLT 277   Cardiac Enzymes:  Recent Labs Lab 03/09/15 1900 03/10/15 0746  TROPONINI 0.14* 0.16*   BNP:    Component Value Date/Time   BNP 685.3* 03/09/2015 1749   CBG:  Recent Labs Lab 03/10/15 0132  GLUCAP 152*   Urine Drug Screen: Drugs of Abuse     Component Value Date/Time   LABOPIA POSITIVE* 03/10/2015 0617   COCAINSCRNUR NONE DETECTED 03/10/2015 0617   LABBENZ NONE DETECTED 03/10/2015 0617   AMPHETMU NONE DETECTED 03/10/2015 0617   THCU POSITIVE* 03/10/2015 0617   LABBARB NONE DETECTED 03/10/2015 0617      Studies/Results: Dg Chest 2 View  03/09/2015  CLINICAL DATA:  Pt is having SOB, chest pain and slight dizziness x 3 days.Hx of CHF, HTN, nonischemic cardiomyopathy, ETOH abuse, smoker, obesity sleep apnea EXAM: CHEST  2 VIEW COMPARISON:  03/08/2015 FINDINGS: Mild cardiomegaly. No mediastinal or hilar masses or evidence of adenopathy. Diffuse peribronchial thickening without change from the prior study. No lung consolidation or overt edema. No pleural effusion or pneumothorax. Skeletal structures are intact. IMPRESSION: 1. No acute cardiopulmonary disease. No change from the previous day's study. Electronically Signed   By: Amie Portland M.D.   On: 03/09/2015 18:14   Dg Chest 2 View  03/08/2015  CLINICAL DATA:  Pt states he was tx here for a cough on 2/24. He states today he feels very weak. He also just started taking tussinex syrup today and is feeling relief from his cough with the medicine. Pt states dyspnea on exertion, hx: CHF, cardiomyopathy EXAM: CHEST  2 VIEW COMPARISON:  03/05/2015 FINDINGS: Midline trachea. Mild cardiomegaly. Mediastinal contours otherwise within normal limits. No pleural effusion or pneumothorax. Low lung volumes with resultant pulmonary interstitial prominence. No congestive failure. Diffuse peribronchial thickening. No lobar consolidation. IMPRESSION: Mild cardiomegaly and low lung volumes. No overt congestive failure or acute disease. Peribronchial thickening which may relate to chronic  bronchitis or smoking. Electronically Signed   By: Jeronimo Greaves M.D.   On: 03/08/2015 19:41   Medications: I have reviewed the patient's current medications. Scheduled Meds: . aspirin EC  81 mg Oral Daily  . carvedilol  25 mg Oral BID WC  . enoxaparin (LOVENOX) injection  60 mg Subcutaneous Q24H  . folic acid  1 mg Oral Daily  . furosemide  60 mg Intravenous BID  . ipratropium-albuterol  3 mL Nebulization Q6H  . losartan  75 mg Oral Daily  . multivitamin with minerals  1 tablet Oral  Daily  . pantoprazole  40 mg Oral Daily  . [START ON 03/11/2015] predniSONE  30 mg Oral Q breakfast   Followed by  . [START ON 03/12/2015] predniSONE  20 mg Oral Q breakfast   Followed by  . [START ON 03/13/2015] predniSONE  10 mg Oral Q breakfast  . spironolactone  25 mg Oral Daily  . thiamine  100 mg Oral Daily   Or  . thiamine  100 mg Intravenous Daily   Continuous Infusions:  PRN Meds:.LORazepam **OR** LORazepam   Assessment/Plan: 60 year old man with nonischemic cardiomyopathy, CHF, HTN, GERD, OSA, ethanol abuse, and cocaine abuse presenting to the hospital with a 3 day history of cough, increased sputum production (brown colored) and wheezing.  Chest pain: EKG with no acute ischemic changes. Troponin elevated at 0.14 to 0.16. He has risk factors for ACS since he has risk factors including DM, smoking, and HTN. Well's score 0 making PE less likely.  -Continue to trend troponin  -Echo  CHF: Echo from 05/2013 with ejection fraction 30-35%, diffuse hypokinesis, grade 1 diastolic dysfunction. Crackles heard on exam of lungs and distended abdomen.  -Repeat Echo -IV Lasix 60 mg BID  -Spironolactone 25 mg daily  -Coreg 25 mg BID -Losartan 75 mg daily  -Strict I/O -Daily weights  Acute bronchitis: Afebrile  -Influenza panel pending -Duoneb q6 prn  -Prednisone taper started by ED on the 25th. Continue taper.  CAD -Aspirin 81 mg daily  -Coreg 25 mg BID -Losartan 75 mg daily   GERD -Protonix 40 mg daily   Diet: 2 gm sodium   DVT/PE ppx: Lovenox   Code: FULL  Dispo: Disposition is deferred at this time, awaiting improvement of current medical problems.  Anticipated discharge in approximately 1-2 day(s).   The patient does have a current PCP [name], MD) and does need an [facility] hospital follow-up appointment after discharge.  The patient does not know have transportation limitations that hinder transportation to clinic appointments.  .Services Needed at time of  discharge: Y = Yes, Blank = No PT:   OT:   RN:   Equipment:   Other:       Lora Paula, MD 03/10/2015, 11:01 AM

## 2015-03-10 NOTE — Progress Notes (Signed)
Patient had recurrent episodes of v-tach. Patient denies chest pain or palpitations. 12-EKG obtained. MD notified.

## 2015-03-10 NOTE — Progress Notes (Signed)
Subjective: [name] states that he feels a little better this morning. He still has a cough but denies producing any sputum. When he coughs he feels an intermittent tightening sensation around the bottom of his chest and upper abdomen.  He still has difficulty lying down flat on his back. When he does this he feels like he's choking and has to sit up to feel better. He can walk short distances but then feels short of breath and feels his cough come back if he tries to walk longer. He denies fever, chills, chest pain overnight.    Objective: Vital signs in last 24 hours: Filed Vitals:   03/10/15 0134 03/10/15 0350 03/10/15 0452 03/10/15 0904  BP: 144/80 131/82 150/90   Pulse: 100 102 99   Temp: 98.9 F (37.2 C) 100.2 F (37.9 C) 99 F (37.2 C)   TempSrc:  Oral Oral   Resp: 20 20 20    Height:   5\' 6"  (1.676 m)   Weight:   115.168 kg (253 lb 14.4 oz)   SpO2: 92% 96% 94% 95%   Weight change:   Intake/Output Summary (Last 24 hours) at 03/10/15 1113 Last data filed at 03/10/15 0609  Gross per 24 hour  Intake      0 ml  Output   1325 ml  Net  -1325 ml   General appearance: alert, cooperative, moderately obese and sitting up in chair Lungs: Increased respiratory effort. Inspiratory crackles in bilateral lower lung fields and expiratory wheezes throughout Chest wall: No tenderness to palpation of chest wall Heart: regular rate and rhythm, S1, S2 normal, no murmur, click, rub or gallop and no JVD appreciated Abdomen: distended and soft. No tenderness to palpation Extremities: No pedal edema noted Lab Results: Basic Metabolic Panel:  Recent Labs  96/04/54 1749  NA 141  K 3.5  CL 101  CO2 28  GLUCOSE 130*  BUN 13  CREATININE 0.83  CALCIUM 8.9   Liver Function Tests: No results for input(s): AST, ALT, ALKPHOS, BILITOT, PROT, ALBUMIN in the last 72 hours. No results for input(s): LIPASE, AMYLASE in the last 72 hours. No results for input(s): AMMONIA in the last 72  hours. CBC:  Recent Labs  03/09/15 1749  WBC 10.7*  HGB 13.4  HCT 40.8  MCV 91.1  PLT 277   Cardiac Enzymes:  Recent Labs  03/09/15 1900 03/10/15 0746  TROPONINI 0.14* 0.16*   BNP: No results for input(s): PROBNP in the last 72 hours. D-Dimer: No results for input(s): DDIMER in the last 72 hours. CBG:  Recent Labs  03/10/15 0132  GLUCAP 152*   Hemoglobin A1C: No results for input(s): HGBA1C in the last 72 hours. Fasting Lipid Panel: No results for input(s): CHOL, HDL, LDLCALC, TRIG, CHOLHDL, LDLDIRECT in the last 72 hours. Thyroid Function Tests: No results for input(s): TSH, T4TOTAL, FREET4, T3FREE, THYROIDAB in the last 72 hours. Anemia Panel: No results for input(s): VITAMINB12, FOLATE, FERRITIN, TIBC, IRON, RETICCTPCT in the last 72 hours. Coagulation: No results for input(s): LABPROT, INR in the last 72 hours. Urine Drug Screen: Drugs of Abuse     Component Value Date/Time   LABOPIA POSITIVE* 03/10/2015 0617   COCAINSCRNUR NONE DETECTED 03/10/2015 0617   LABBENZ NONE DETECTED 03/10/2015 0617   AMPHETMU NONE DETECTED 03/10/2015 0617   THCU POSITIVE* 03/10/2015 0617   LABBARB NONE DETECTED 03/10/2015 0617    Alcohol Level: No results for input(s): ETH in the last 72 hours. Urinalysis: No results for input(s): COLORURINE, LABSPEC,  PHURINE, GLUCOSEU, HGBUR, BILIRUBINUR, KETONESUR, PROTEINUR, UROBILINOGEN, NITRITE, LEUKOCYTESUR in the last 72 hours.  Invalid input(s): APPERANCEUR Misc. Labs: None Micro Results: No results found for this or any previous visit (from the past 240 hour(s)). Studies/Results: Dg Chest 2 View  03/09/2015  CLINICAL DATA:  Pt is having SOB, chest pain and slight dizziness x 3 days.Hx of CHF, HTN, nonischemic cardiomyopathy, ETOH abuse, smoker, obesity sleep apnea EXAM: CHEST  2 VIEW COMPARISON:  03/08/2015 FINDINGS: Mild cardiomegaly. No mediastinal or hilar masses or evidence of adenopathy. Diffuse peribronchial thickening  without change from the prior study. No lung consolidation or overt edema. No pleural effusion or pneumothorax. Skeletal structures are intact. IMPRESSION: 1. No acute cardiopulmonary disease. No change from the previous day's study. Electronically Signed   By: Amie Portland M.D.   On: 03/09/2015 18:14   Dg Chest 2 View  03/08/2015  CLINICAL DATA:  Pt states he was tx here for a cough on 2/24. He states today he feels very weak. He also just started taking tussinex syrup today and is feeling relief from his cough with the medicine. Pt states dyspnea on exertion, hx: CHF, cardiomyopathy EXAM: CHEST  2 VIEW COMPARISON:  03/05/2015 FINDINGS: Midline trachea. Mild cardiomegaly. Mediastinal contours otherwise within normal limits. No pleural effusion or pneumothorax. Low lung volumes with resultant pulmonary interstitial prominence. No congestive failure. Diffuse peribronchial thickening. No lobar consolidation. IMPRESSION: Mild cardiomegaly and low lung volumes. No overt congestive failure or acute disease. Peribronchial thickening which may relate to chronic bronchitis or smoking. Electronically Signed   By: Jeronimo Greaves M.D.   On: 03/08/2015 19:41   Medications: I have reviewed the patient's current medications. Scheduled Meds: . aspirin EC  81 mg Oral Daily  . carvedilol  25 mg Oral BID WC  . enoxaparin (LOVENOX) injection  60 mg Subcutaneous Q24H  . folic acid  1 mg Oral Daily  . furosemide  60 mg Intravenous BID  . ipratropium-albuterol  3 mL Nebulization Q6H  . losartan  75 mg Oral Daily  . multivitamin with minerals  1 tablet Oral Daily  . pantoprazole  40 mg Oral Daily  . [START ON 03/11/2015] predniSONE  30 mg Oral Q breakfast   Followed by  . [START ON 03/12/2015] predniSONE  20 mg Oral Q breakfast   Followed by  . [START ON 03/13/2015] predniSONE  10 mg Oral Q breakfast  . spironolactone  25 mg Oral Daily  . thiamine  100 mg Oral Daily   Or  . thiamine  100 mg Intravenous Daily    Continuous Infusions:  PRN Meds:.LORazepam **OR** LORazepam Assessment/Plan: Principal Problem:   Chest pain Active Problems:   Alcohol abuse   HYPERTENSION   Severe OSA   Obesity, morbid (HCC)   Shortness of breath   Acute bronchitis   Cardiomyopathy (HCC)  SOB/cough/difficulty breathing Patient is presenting with a 3 day history of subjective fevers, cough, sputum production, wheezing, and SOB. He did have recent sick contacts at home. He is satting in the mid 90s via [place]. White count elevated at 10.7 and was febrile (100.2) overnight, however, CXR did not show any acute abnormality making pneumonia less likely Influenza panel negative.  Lung exam remarkable for rhonchi at the bases bilaterally. Patient could be fluid overloaded although no pedal edema present. Could be CHF exasperation. BNP is elevated at 685.3.  -Continue telemetry -Duoneb q6 prn  -Prednisone taper started by ED on the 25th. We will continue the treatment for now.  -  f/u repeat echo to assess current EF  Chest pain  Patient reports having chest burning/ pain with cough. EKG is not showing any acute ST or T wave changes. Troponin elevated at 0.14. Latest troponin is 0.16. Well's score 0 making PE less likely.  -Continue to trend troponin   CAD Cardiac cath done in 2011 showing normal coronary arteries. -Aspirin 81 mg daily  -Coreg 25 mg BID -Losartan 75 mg daily   CHF -Lasix 60 mg BID -Spironolactone 25 mg daily  -Coreg 25 mg BID -Losartan 75 mg daily   GERD -Protonix 40 mg daily   Diet: 2 gm sodium   DVT/PE ppx: Lovenox   Dispo: Disposition is deferred at this time, awaiting improvement of current medical problems. Anticipated discharge   This is a Psychologist, occupational Note.  The care of the patient was discussed with Dr. Isabella Bowens and the assessment and plan formulated with their assistance.  Please see their attached note for official documentation of the daily encounter.     Veda Canning, Med  Student 03/10/2015, 11:13 AM

## 2015-03-10 NOTE — Progress Notes (Signed)
Attempted report 

## 2015-03-10 NOTE — Progress Notes (Signed)
Attempted report x 2 

## 2015-03-10 NOTE — Progress Notes (Signed)
Patient received from 5 [place]. Oriented to room, telemetry on, call light within reach. No pain, distress or needs expressed at this time. Spoke with telemetry. Patient not having true V-tach, is having multiiplePVC's and occasional wide QRS'. Patient is asymptomatic and refusing oxygen at this time. Will continue to monitor.

## 2015-03-11 ENCOUNTER — Ambulatory Visit: Payer: Medicaid Other | Admitting: Internal Medicine

## 2015-03-11 DIAGNOSIS — J209 Acute bronchitis, unspecified: Secondary | ICD-10-CM

## 2015-03-11 LAB — BASIC METABOLIC PANEL
ANION GAP: 12 (ref 5–15)
BUN: 18 mg/dL (ref 6–20)
CALCIUM: 9.3 mg/dL (ref 8.9–10.3)
CO2: 31 mmol/L (ref 22–32)
Chloride: 97 mmol/L — ABNORMAL LOW (ref 101–111)
Creatinine, Ser: 1.07 mg/dL (ref 0.61–1.24)
Glucose, Bld: 123 mg/dL — ABNORMAL HIGH (ref 65–99)
Potassium: 3.5 mmol/L (ref 3.5–5.1)
Sodium: 140 mmol/L (ref 135–145)

## 2015-03-11 LAB — CBC
HCT: 46.9 % (ref 39.0–52.0)
HEMOGLOBIN: 15.7 g/dL (ref 13.0–17.0)
MCH: 30.3 pg (ref 26.0–34.0)
MCHC: 33.5 g/dL (ref 30.0–36.0)
MCV: 90.4 fL (ref 78.0–100.0)
Platelets: 325 10*3/uL (ref 150–400)
RBC: 5.19 MIL/uL (ref 4.22–5.81)
RDW: 13.9 % (ref 11.5–15.5)
WBC: 10 10*3/uL (ref 4.0–10.5)

## 2015-03-11 LAB — PHOSPHORUS: PHOSPHORUS: 4.1 mg/dL (ref 2.5–4.6)

## 2015-03-11 LAB — MAGNESIUM: MAGNESIUM: 2.3 mg/dL (ref 1.7–2.4)

## 2015-03-11 MED ORDER — ACETAMINOPHEN 325 MG PO TABS
650.0000 mg | ORAL_TABLET | Freq: Four times a day (QID) | ORAL | Status: DC | PRN
  Administered 2015-03-11 – 2015-03-12 (×3): 650 mg via ORAL
  Filled 2015-03-11 (×3): qty 2

## 2015-03-11 MED ORDER — FUROSEMIDE 10 MG/ML IJ SOLN
60.0000 mg | Freq: Two times a day (BID) | INTRAMUSCULAR | Status: DC
  Administered 2015-03-11 – 2015-03-12 (×2): 60 mg via INTRAVENOUS
  Filled 2015-03-11 (×2): qty 6

## 2015-03-11 MED ORDER — FUROSEMIDE 10 MG/ML IJ SOLN
60.0000 mg | Freq: Two times a day (BID) | INTRAMUSCULAR | Status: DC
  Administered 2015-03-11: 60 mg via INTRAVENOUS
  Filled 2015-03-11 (×2): qty 6

## 2015-03-11 NOTE — Progress Notes (Signed)
Subjective: Feels better overall. He denies dyspnea at rest while sitting up. Still having orthopnea. He reports abdominal discomfort/tightness has improved and denies chest pain. Still having cough but this has also improved.   Objective: Vital signs in last 24 hours: Filed Vitals:   03/10/15 2037 03/11/15 0631 03/11/15 0633 03/11/15 0906  BP: 118/73 117/96    Pulse: 71 91  65  Temp: 98 F (36.7 C) 98.6 F (37 C)    TempSrc: Oral Oral    Resp: 19 20  16   Height:      Weight:   246 lb 11.1 oz (111.9 kg)   SpO2: 93% 92%  91%   Weight change: -17 lb 4.9 oz (-7.849 kg)  Intake/Output Summary (Last 24 hours) at 03/11/15 1036 Last data filed at 03/11/15 0908  Gross per 24 hour  Intake    730 ml  Output   1200 ml  Net   -470 ml   General Apperance: NAD HEENT: Normocephalic, atraumatic, anicteric sclera Neck: Supple, trachea midline, unable to evaluate JVD due to body habitus Lungs: Some rales in bilateral bases but improved. Heart: Regular rate and rhythm, no murmur/rub/gallop Abdomen: Soft, nontender, mildly distended, no rebound/guarding Extremities: Warm and well perfused, no edema Skin: No rashes or lesions Neurologic: Alert and interactive. No gross deficits.  Lab Results: Basic Metabolic Panel:  Recent Labs Lab 03/10/15 1311 03/11/15 0435  NA 138 140  K 3.4* 3.5  CL 93* 97*  CO2 31 31  GLUCOSE 163* 123*  BUN 12 18  CREATININE 0.98 1.07  CALCIUM 9.4 9.3  MG  --  2.3  PHOS  --  4.1   CBC:  Recent Labs Lab 03/10/15 1311 03/11/15 0435  WBC 11.1* 10.0  HGB 15.6 15.7  HCT 45.6 46.9  MCV 90.7 90.4  PLT 298 325   Cardiac Enzymes:  Recent Labs Lab 03/10/15 0746 03/10/15 1311 03/10/15 1930  TROPONINI 0.16* 0.17* 0.16*   BNP:    Component Value Date/Time   BNP 685.3* 03/09/2015 1749   CBG:  Recent Labs Lab 03/10/15 0132  GLUCAP 152*   Urine Drug Screen: Drugs of Abuse     Component Value Date/Time   LABOPIA POSITIVE* 03/10/2015 0617    COCAINSCRNUR NONE DETECTED 03/10/2015 0617   LABBENZ NONE DETECTED 03/10/2015 0617   AMPHETMU NONE DETECTED 03/10/2015 0617   THCU POSITIVE* 03/10/2015 0617   LABBARB NONE DETECTED 03/10/2015 0617    Studies/Results: Dg Chest 2 View  03/09/2015  CLINICAL DATA:  Pt is having SOB, chest pain and slight dizziness x 3 days.Hx of CHF, HTN, nonischemic cardiomyopathy, ETOH abuse, smoker, obesity sleep apnea EXAM: CHEST  2 VIEW COMPARISON:  03/08/2015 FINDINGS: Mild cardiomegaly. No mediastinal or hilar masses or evidence of adenopathy. Diffuse peribronchial thickening without change from the prior study. No lung consolidation or overt edema. No pleural effusion or pneumothorax. Skeletal structures are intact. IMPRESSION: 1. No acute cardiopulmonary disease. No change from the previous day's study. Electronically Signed   By: Amie Portland M.D.   On: 03/09/2015 18:14   Medications: I have reviewed the patient's current medications. Scheduled Meds: . aspirin EC  81 mg Oral Daily  . carvedilol  25 mg Oral BID WC  . enoxaparin (LOVENOX) injection  60 mg Subcutaneous Q24H  . folic acid  1 mg Oral Daily  . furosemide  60 mg Intravenous BID  . ipratropium-albuterol  3 mL Nebulization BID  . losartan  100 mg Oral Daily  . multivitamin  with minerals  1 tablet Oral Daily  . pantoprazole  40 mg Oral Daily  . [START ON 03/12/2015] predniSONE  20 mg Oral Q breakfast   Followed by  . [START ON 03/13/2015] predniSONE  10 mg Oral Q breakfast  . spironolactone  25 mg Oral Daily  . thiamine  100 mg Oral Daily   Continuous Infusions:  PRN Meds:.acetaminophen, LORazepam **OR** LORazepam   Assessment/Plan: 60 year old man with nonischemic cardiomyopathy, CHF, HTN, GERD, OSA, ethanol abuse, and cocaine abuse presenting to the hospital with a 3 day history of cough, increased sputum production (brown colored) and wheezing.  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 Repeat echo with EF 40-45%, diffuse hypokinesis and grade 3 diastolic dysfunction. -2L overall and weight down to 246 from 264 at admission.  -IV Lasix 60 mg BID  -Spironolactone 25 mg daily  -Coreg 25 mg BID -Losartan 100 mg daily  -Strict I/O -Daily weights  Chest pain: Troponin downtrending. No recurrence of chest pain. -Continue to monitor  Acute bronchitis: Afebrile and no leukocytosis -Influenza panel neg -Duoneb q6 prn  -Prednisone taper started by ED on the 25th. Continue taper.  CAD -Aspirin 81 mg daily   GERD -Protonix 40 mg daily   FEN: 2 gm sodium   VTE ppx: Lovenox   Code: FULL  Dispo: Disposition is deferred at this time, awaiting improvement of current medical problems.  Anticipated discharge in approximately 1 day(s).   The patient does have a current PCP Ethan Lever, MD) and does need an [facility] hospital follow-up appointment after discharge.  The patient does not know have transportation limitations that hinder transportation to clinic appointments.  .Services Needed at time of discharge: Y = Yes, Blank = No PT:   OT:   RN:   Equipment:   Other:     LOS: 1 day   Ethan Paula, MD 03/11/2015, 10:36 AM

## 2015-03-11 NOTE — Progress Notes (Signed)
Subjective: Ethan Wilcox is doing better with his breathing this morning. However he continues to have difficulty while lying flat on his back. This position causes him to cough and become SOB. He states his cough has become productive with brownish sputum. He is able to ambulate better than yesterday and the upper abdominal tightness he describes yesterday has also improved.  Objective: Vital signs in last 24 hours: Filed Vitals:   03/10/15 2037 03/11/15 0631 03/11/15 0633 03/11/15 0906  BP: 118/73 117/96    Pulse: 71 91  65  Temp: 98 F (36.7 C) 98.6 F (37 C)    TempSrc: Oral Oral    Resp: 19 20  16   Height:      Weight:   111.9 kg (246 lb 11.1 oz)   SpO2: 93% 92%  91%   Weight change: -7.849 kg (-17 lb 4.9 oz)  Intake/Output Summary (Last 24 hours) at 03/11/15 1109 Last data filed at 03/11/15 0908  Gross per 24 hour  Intake    730 ml  Output   1200 ml  Net   -470 ml   General appearance: alert, cooperative, moderately obese and sitting up in chair Lungs: Increased respiratory effort. Inspiratory crackles present in lower lung fields but improved from yesterday. Expiratory wheezes present. Chest wall: No tenderness to palpation of chest wall Heart: regular rate and rhythm, S1, S2 normal, no murmur, click, rub or gallop and no JVD appreciated Abdomen: distended and soft. No tenderness to palpation Extremities: No pedal edema noted  Lab Results: Basic Metabolic Panel:  Recent Labs  16/10/96 1311 03/11/15 0435  NA 138 140  K 3.4* 3.5  CL 93* 97*  CO2 31 31  GLUCOSE 163* 123*  BUN 12 18  CREATININE 0.98 1.07  CALCIUM 9.4 9.3  MG  --  2.3  PHOS  --  4.1   Liver Function Tests: No results for input(s): AST, ALT, ALKPHOS, BILITOT, PROT, ALBUMIN in the last 72 hours. No results for input(s): LIPASE, AMYLASE in the last 72 hours. No results for input(s): AMMONIA in the last 72 hours. CBC:  Recent Labs  03/10/15 1311 03/11/15 0435  WBC 11.1* 10.0  HGB 15.6 15.7    HCT 45.6 46.9  MCV 90.7 90.4  PLT 298 325   Cardiac Enzymes:  Recent Labs  03/10/15 0746 03/10/15 1311 03/10/15 1930  TROPONINI 0.16* 0.17* 0.16*   BNP: No results for input(s): PROBNP in the last 72 hours. D-Dimer: No results for input(s): DDIMER in the last 72 hours. CBG:  Recent Labs  03/10/15 0132  GLUCAP 152*   Hemoglobin A1C: No results for input(s): HGBA1C in the last 72 hours. Fasting Lipid Panel: No results for input(s): CHOL, HDL, LDLCALC, TRIG, CHOLHDL, LDLDIRECT in the last 72 hours. Thyroid Function Tests: No results for input(s): TSH, T4TOTAL, FREET4, T3FREE, THYROIDAB in the last 72 hours. Anemia Panel: No results for input(s): VITAMINB12, FOLATE, FERRITIN, TIBC, IRON, RETICCTPCT in the last 72 hours. Coagulation: No results for input(s): LABPROT, INR in the last 72 hours. Urine Drug Screen: Drugs of Abuse     Component Value Date/Time   LABOPIA POSITIVE* 03/10/2015 0617   COCAINSCRNUR NONE DETECTED 03/10/2015 0617   LABBENZ NONE DETECTED 03/10/2015 0617   AMPHETMU NONE DETECTED 03/10/2015 0617   THCU POSITIVE* 03/10/2015 0617   LABBARB NONE DETECTED 03/10/2015 0617    Alcohol Level: No results for input(s): ETH in the last 72 hours. Urinalysis: No results for input(s): COLORURINE, LABSPEC, PHURINE, GLUCOSEU, HGBUR, BILIRUBINUR,  KETONESUR, PROTEINUR, UROBILINOGEN, NITRITE, LEUKOCYTESUR in the last 72 hours.  Invalid input(s): APPERANCEUR Misc. Labs: None Micro Results: No results found for this or any previous visit (from the past 240 hour(s)). Studies/Results: Dg Chest 2 View  03/09/2015  CLINICAL DATA:  Pt is having SOB, chest pain and slight dizziness x 3 days.Hx of CHF, HTN, nonischemic cardiomyopathy, ETOH abuse, smoker, obesity sleep apnea EXAM: CHEST  2 VIEW COMPARISON:  03/08/2015 FINDINGS: Mild cardiomegaly. No mediastinal or hilar masses or evidence of adenopathy. Diffuse peribronchial thickening without change from the prior study.  No lung consolidation or overt edema. No pleural effusion or pneumothorax. Skeletal structures are intact. IMPRESSION: 1. No acute cardiopulmonary disease. No change from the previous day's study. Electronically Signed   By: Amie Portland M.D.   On: 03/09/2015 18:14   Medications: I have reviewed the patient's current medications. Scheduled Meds: . aspirin EC  81 mg Oral Daily  . carvedilol  25 mg Oral BID WC  . enoxaparin (LOVENOX) injection  60 mg Subcutaneous Q24H  . folic acid  1 mg Oral Daily  . furosemide  60 mg Intravenous BID  . ipratropium-albuterol  3 mL Nebulization BID  . losartan  100 mg Oral Daily  . multivitamin with minerals  1 tablet Oral Daily  . pantoprazole  40 mg Oral Daily  . [START ON 03/12/2015] predniSONE  20 mg Oral Q breakfast   Followed by  . [START ON 03/13/2015] predniSONE  10 mg Oral Q breakfast  . spironolactone  25 mg Oral Daily  . thiamine  100 mg Oral Daily   Continuous Infusions:  PRN Meds:.acetaminophen, LORazepam **OR** LORazepam Assessment/Plan: Principal Problem:   Chest pain Active Problems:   Alcohol abuse   HYPERTENSION   Severe OSA   Obesity, morbid (HCC)   Shortness of breath   Acute bronchitis   Cardiomyopathy (HCC)   Acute exacerbation of CHF (congestive heart failure) (HCC)   Elevated troponin  SOB/cough/difficulty breathing Patient is presenting with a 3 day history of subjective fevers, cough, sputum production, wheezing, and SOB. Patient most likely has CHF vs. COPD exasperation.Echo showed preserved EF of 40% but presence of diastolic dysfunction and elevated left atrial pressure suggesting his HF is combined systolic and diastolic. Lung exam remarkable for rhonchi at the bases bilaterally yesterday but this has improved today following lasix dose increase. Patient likely still mildly fluid overloaded although he is -2L overall and weight is down 18lbs since admission.  COPD -Duoneb q6 prn  -Prednisone taper started by ED on  the 25th. We will continue the treatment for now.   Chest pain  Patient reports having chest burning/ pain with cough. EKG is not showing any acute ST or T wave changes. Troponin elevated at 0.14. Troponin remains at 0.16.  -Continue to monitor  CAD Cardiac cath done in 2011 showing normal coronary arteries. -Aspirin 81 mg daily  -Coreg 25 mg BID -Losartan 75 mg daily   CHF (combined diastolic and systolic) -Lasix 60 mg BID -Spironolactone 25 mg daily  -Coreg 25 mg BID -Losartan 75 mg daily   GERD -Protonix 40 mg daily   Diet: 2 gm sodium   DVT/PE ppx: Lovenox   Dispo: Disposition is deferred at this time, awaiting improvement of current medical problems. Anticipated discharge tomorrow.  This is a Psychologist, occupational Note.  The care of the patient was discussed with Dr. Isabella Bowens and the assessment and plan formulated with their assistance.  Please see their attached note for official  documentation of the daily encounter.   LOS: 1 day   Veda Canning, Med Student 03/11/2015, 11:09 AM

## 2015-03-12 ENCOUNTER — Encounter (HOSPITAL_COMMUNITY): Payer: Medicaid Other

## 2015-03-12 DIAGNOSIS — R079 Chest pain, unspecified: Secondary | ICD-10-CM

## 2015-03-12 LAB — GLUCOSE, CAPILLARY: Glucose-Capillary: 105 mg/dL — ABNORMAL HIGH (ref 65–99)

## 2015-03-12 LAB — BASIC METABOLIC PANEL
ANION GAP: 15 (ref 5–15)
BUN: 21 mg/dL — ABNORMAL HIGH (ref 6–20)
CALCIUM: 9.5 mg/dL (ref 8.9–10.3)
CO2: 28 mmol/L (ref 22–32)
Chloride: 92 mmol/L — ABNORMAL LOW (ref 101–111)
Creatinine, Ser: 1.21 mg/dL (ref 0.61–1.24)
GFR calc non Af Amer: >60 mL/min (ref >60)
Glucose, Bld: 179 mg/dL — ABNORMAL HIGH (ref 65–99)
POTASSIUM: 3.8 mmol/L (ref 3.5–5.1)
Sodium: 135 mmol/L (ref 135–145)

## 2015-03-12 MED ORDER — ALBUTEROL SULFATE HFA 108 (90 BASE) MCG/ACT IN AERS: 2.0000 | INHALATION_SPRAY | RESPIRATORY_TRACT | Status: AC | PRN

## 2015-03-12 MED ORDER — FUROSEMIDE 40 MG PO TABS: 60.0000 mg | ORAL_TABLET | Freq: Every day | ORAL | Status: DC

## 2015-03-12 MED ORDER — LOSARTAN POTASSIUM 100 MG PO TABS: 100.0000 mg | ORAL_TABLET | Freq: Every day | ORAL | Status: DC

## 2015-03-12 NOTE — Discharge Instructions (Signed)
Be sure to take your new Losartan dose and continue Lasix, aldactone and coreg as previously prescribed. Follow up with your cardiologist as previously scheduled.  Heart Failure Heart failure means your heart has trouble pumping blood. This makes it hard for your body to work well. Heart failure is usually a long-term (chronic) condition. You must take good care of yourself and follow your doctor's treatment plan. HOME CARE  Take your heart medicine as told by your doctor.  Do not stop taking medicine unless your doctor tells you to.  Do not skip any dose of medicine.  Refill your medicines before they run out.  Take other medicines only as told by your doctor or pharmacist.  Stay active if told by your doctor. The elderly and people with severe heart failure should talk with a doctor about physical activity.  Eat heart-healthy foods. Choose foods that are without trans fat and are low in saturated fat, cholesterol, and salt (sodium). This includes fresh or frozen fruits and vegetables, fish, lean meats, fat-free or low-fat dairy foods, whole grains, and high-fiber foods. Lentils and dried peas and beans (legumes) are also good choices.  Limit salt if told by your doctor.  Cook in a healthy way. Roast, grill, broil, bake, poach, steam, or stir-fry foods.  Limit fluids as told by your doctor.  Weigh yourself every morning. Do this after you pee (urinate) and before you eat breakfast. Write down your weight to give to your doctor.  Take your blood pressure and write it down if your doctor tells you to.  Ask your doctor how to check your pulse. Check your pulse as told.  Lose weight if told by your doctor.  Stop smoking or chewing tobacco. Do not use gum or patches that help you quit without your doctor's approval.  Schedule and go to doctor visits as told.  Nonpregnant women should have no more than 1 drink a day. Men should have no more than 2 drinks a day. Talk to your doctor  about drinking alcohol.  Stop illegal drug use.  Stay current with shots (immunizations).  Manage your health conditions as told by your doctor.  Learn to manage your stress.  Rest when you are tired.  If it is really hot outside:  Avoid intense activities.  Use air conditioning or fans, or get in a cooler place.  Avoid caffeine and alcohol.  Wear loose-fitting, lightweight, and light-colored clothing.  If it is really cold outside:  Avoid intense activities.  Layer your clothing.  Wear mittens or gloves, a hat, and a scarf when going outside.  Avoid alcohol.  Learn about heart failure and get support as needed.  Get help to maintain or improve your quality of life and your ability to care for yourself as needed. GET HELP IF:   You gain weight quickly.  You are more short of breath than usual.  You cannot do your normal activities.  You tire easily.  You cough more than normal, especially with activity.  You have any or more puffiness (swelling) in areas such as your hands, feet, ankles, or belly (abdomen).  You cannot sleep because it is hard to breathe.  You feel like your heart is beating fast (palpitations).  You get dizzy or light-headed when you stand up. GET HELP RIGHT AWAY IF:   You have trouble breathing.  There is a change in mental status, such as becoming less alert or not being able to focus.  You have chest pain  or discomfort.  You faint. MAKE SURE YOU:   Understand these instructions.  Will watch your condition.  Will get help right away if you are not doing well or get worse.   This information is not intended to replace advice given to you by your health care provider. Make sure you discuss any questions you have with your health care provider.   Document Released: 10/05/2007 Document Revised: 01/16/2014 Document Reviewed: 02/12/2012 [employer] Interactive Patient Education [employer]

## 2015-03-12 NOTE — Progress Notes (Signed)
Subjective: Ethan Wilcox did well overnight with no acute events. He states his symptoms are much improved from admission. He is able to walk freely and the cough is better. Denies fevers, chills, chest pain, nausea, vomiting. Objective: Vital signs in last 24 hours: Filed Vitals:   03/11/15 1151 03/11/15 2019 03/11/15 2050 03/12/15 0556  BP: 141/94 119/71  110/71  Pulse: 76 82  102  Temp: 98.5 F (36.9 C) 97.8 F (36.6 C)  98.2 F (36.8 C)  TempSrc: Oral Oral  Oral  Resp: Height:      Weight:    108.636 kg (239 lb 8 oz)  SpO2: 96% 91% 95% 92%   Weight change: -3.264 kg (-7 lb 3.1 oz)  Intake/Output Summary (Last 24 hours) at 03/12/15 1238 Last data filed at 03/12/15 0730  Gross per 24 hour  Intake    460 ml  Output    800 ml  Net   -340 ml   General appearance: alert, cooperative, moderately obese and sitting up in chair Lungs: Lungs CTAB. Normal respiratory effort.  Chest wall: No tenderness to palpation of chest wall Heart: regular rate and rhythm, S1, S2 normal, no murmur, click, rub or gallop and no JVD appreciated Abdomen: distended and soft. No tenderness to palpation Extremities: No pedal edema noted  Lab Results: Basic Metabolic Panel:  Recent Labs  09/81/19 0435 03/12/15 1010  NA 140 135  K 3.5 3.8  CL 97* 92*  CO2 31 28  GLUCOSE 123* 179*  BUN 18 21*  CREATININE 1.07 1.21  CALCIUM 9.3 9.5  MG 2.3  --   PHOS 4.1  --    Liver Function Tests: No results for input(s): AST, ALT, ALKPHOS, BILITOT, PROT, ALBUMIN in the last 72 hours. No results for input(s): LIPASE, AMYLASE in the last 72 hours. No results for input(s): AMMONIA in the last 72 hours. CBC:  Recent Labs  03/10/15 1311 03/11/15 0435  WBC 11.1* 10.0  HGB 15.6 15.7  HCT 45.6 46.9  MCV 90.7 90.4  PLT 298 325   Cardiac Enzymes:  Recent Labs  03/10/15 0746 03/10/15 1311 03/10/15 1930  TROPONINI 0.16* 0.17* 0.16*   BNP: No results for input(s): PROBNP in the last 72  hours. D-Dimer: No results for input(s): DDIMER in the last 72 hours. CBG:  Recent Labs  03/10/15 0132 03/12/15 0601  GLUCAP 152* 105*   Hemoglobin A1C: No results for input(s): HGBA1C in the last 72 hours. Fasting Lipid Panel: No results for input(s): CHOL, HDL, LDLCALC, TRIG, CHOLHDL, LDLDIRECT in the last 72 hours. Thyroid Function Tests: No results for input(s): TSH, T4TOTAL, FREET4, T3FREE, THYROIDAB in the last 72 hours. Anemia Panel: No results for input(s): VITAMINB12, FOLATE, FERRITIN, TIBC, IRON, RETICCTPCT in the last 72 hours. Coagulation: No results for input(s): LABPROT, INR in the last 72 hours. Urine Drug Screen: Drugs of Abuse     Component Value Date/Time   LABOPIA POSITIVE* 03/10/2015 0617   COCAINSCRNUR NONE DETECTED 03/10/2015 0617   LABBENZ NONE DETECTED 03/10/2015 0617   AMPHETMU NONE DETECTED 03/10/2015 0617   THCU POSITIVE* 03/10/2015 0617   LABBARB NONE DETECTED 03/10/2015 0617    Alcohol Level: No results for input(s): ETH in the last 72 hours. Urinalysis: No results for input(s): COLORURINE, LABSPEC, PHURINE, GLUCOSEU, HGBUR, BILIRUBINUR, KETONESUR, PROTEINUR, UROBILINOGEN, NITRITE, LEUKOCYTESUR in the last 72 hours.  Invalid input(s): APPERANCEUR Misc. Labs: None Micro Results: No results found for this or any previous visit (from the  past 240 hour(s)). Studies/Results: No results found. Medications: I have reviewed the patient's current medications. Scheduled Meds: . aspirin EC  81 mg Oral Daily  . carvedilol  25 mg Oral BID WC  . enoxaparin (LOVENOX) injection  60 mg Subcutaneous Q24H  . folic acid  1 mg Oral Daily  . furosemide  60 mg Oral Daily  . ipratropium-albuterol  3 mL Nebulization BID  . losartan  100 mg Oral Daily  . multivitamin with minerals  1 tablet Oral Daily  . pantoprazole  40 mg Oral Daily  . [START ON 03/13/2015] predniSONE  10 mg Oral Q breakfast  . spironolactone  25 mg Oral Daily  . thiamine  100 mg Oral Daily    Continuous Infusions:  PRN Meds:.acetaminophen, LORazepam **OR** LORazepam Assessment/Plan: Principal Problem:   Chest pain Active Problems:   Alcohol abuse   HYPERTENSION   Severe OSA   Obesity, morbid (HCC)   Shortness of breath   Acute bronchitis   Cardiomyopathy (HCC)   Acute exacerbation of CHF (congestive heart failure) (HCC)   Elevated troponin  SOB/cough/difficulty breathing Patient is presenting with a 3 day history of subjective fevers, cough, sputum production, wheezing, and SOB. Patient most likely had combined systolic and diastolic CHF exasperation.Echo showed preserved EF of 40% but presence of diastolic dysfunction and elevated left atrial pressure suggesting his HF is combined systolic and diastolic. Lung exam remarkable for rhonchi at the bases bilaterally yesterday but this has improved today following lasix dose increase. Lungs are CTAB today and patient is subjectively much improved from admission.   COPD -Duoneb q6 prn  -Prednisone taper started by ED on the 25th. We will continue the treatment for now but d/c at discharge.   Chest pain  Patient reports having chest burning/ pain with cough. EKG is not showing any acute ST or T wave changes. Troponin elevated at 0.14. Troponin peaked at 0.17 but is not declining.   CAD Cardiac cath done in 2011 showing normal coronary arteries. -Aspirin 81 mg daily  -Coreg 25 mg BID -Losartan 100 mg daily   CHF (combined diastolic and systolic) Will d/c patient on 100mg  of losartan daily. Will d/c BiDil. -Lasix 60 mg BID -Spironolactone 25 mg daily  -Coreg 25 mg BID -Losartan 100 mg daily   GERD -Protonix 40 mg daily   Diet: 2 gm sodium   DVT/PE ppx: Lovenox   Dispo: Anticipated d/c to home today.   This is a Psychologist, occupational Note.  The care of the patient was discussed with Dr. Isabella Bowens and the assessment and plan formulated with their assistance.  Please see their attached note for official  documentation of the daily encounter.   LOS: 2 days   Ethan Wilcox, Med Student 03/12/2015, 12:38 PM

## 2015-03-12 NOTE — Progress Notes (Signed)
Subjective: Feels better overall. He reports he is back to his baseline. Orthopnea resolved. Denies chest pain. Ambulating without trouble.   Objective: Vital signs in last 24 hours: Filed Vitals:   03/11/15 1151 03/11/15 2019 03/11/15 2050 03/12/15 0556  BP: 141/94 119/71  110/71  Pulse: 76 82  102  Temp: 98.5 F (36.9 C) 97.8 F (36.6 C)  98.2 F (36.8 C)  TempSrc: Oral Oral  Oral  Resp: Height:      Weight:    239 lb 8 oz (108.636 kg)  SpO2: 96% 91% 95% 92%   Weight change: -7 lb 3.1 oz (-3.264 kg)  Intake/Output Summary (Last 24 hours) at 03/12/15 1048 Last data filed at 03/11/15 1950  Gross per 24 hour  Intake    220 ml  Output      0 ml  Net    220 ml   General Apperance: NAD HEENT: Normocephalic, atraumatic, anicteric sclera Neck: Supple, trachea midline, unable to evaluate JVD due to body habitus Lungs: CTAB Heart: Regular rate and rhythm, no murmur/rub/gallop Abdomen: Soft, nontender, mildly distended, no rebound/guarding Extremities: Warm and well perfused, no edema Skin: No rashes or lesions Neurologic: Alert and interactive. No gross deficits.  Lab Results: Basic Metabolic Panel:  Recent Labs Lab 03/10/15 1311 03/11/15 0435  NA 138 140  K 3.4* 3.5  CL 93* 97*  CO2 31 31  GLUCOSE 163* 123*  BUN 12 18  CREATININE 0.98 1.07  CALCIUM 9.4 9.3  MG  --  2.3  PHOS  --  4.1   CBC:  Recent Labs Lab 03/10/15 1311 03/11/15 0435  WBC 11.1* 10.0  HGB 15.6 15.7  HCT 45.6 46.9  MCV 90.7 90.4  PLT 298 325   Cardiac Enzymes:  Recent Labs Lab 03/10/15 0746 03/10/15 1311 03/10/15 1930  TROPONINI 0.16* 0.17* 0.16*   BNP:    Component Value Date/Time   BNP 685.3* 03/09/2015 1749   CBG:  Recent Labs Lab 03/10/15 0132 03/12/15 0601  GLUCAP 152* 105*   Urine Drug Screen: Drugs of Abuse     Component Value Date/Time   LABOPIA POSITIVE* 03/10/2015 0617   COCAINSCRNUR NONE DETECTED 03/10/2015 0617   LABBENZ NONE DETECTED  03/10/2015 0617   AMPHETMU NONE DETECTED 03/10/2015 0617   THCU POSITIVE* 03/10/2015 0617   LABBARB NONE DETECTED 03/10/2015 0617    Medications: I have reviewed the patient's current medications. Scheduled Meds: . aspirin EC  81 mg Oral Daily  . carvedilol  25 mg Oral BID WC  . enoxaparin (LOVENOX) injection  60 mg Subcutaneous Q24H  . folic acid  1 mg Oral Daily  . furosemide  60 mg Oral Daily  . ipratropium-albuterol  3 mL Nebulization BID  . losartan  100 mg Oral Daily  . multivitamin with minerals  1 tablet Oral Daily  . pantoprazole  40 mg Oral Daily  . [START ON 03/13/2015] predniSONE  10 mg Oral Q breakfast  . spironolactone  25 mg Oral Daily  . thiamine  100 mg Oral Daily   Continuous Infusions:  PRN Meds:.acetaminophen, LORazepam **OR** LORazepam   Assessment/Plan: 60 year old man with nonischemic cardiomyopathy, CHF, HTN, GERD, OSA, ethanol abuse, and cocaine abuse presenting to the hospital with a 3 day history of cough, increased sputum production (brown colored) and wheezing.  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 Repeat echo with EF 40-45%,  diffuse hypokinesis and grade 3 diastolic dysfunction. Improved orthopnea.  -Transition from IV Lasix 60 mg BID to home PO Lasix 60mg  daily -Spironolactone 25 mg daily  -Coreg 25 mg BID -Losartan 100 mg daily   Chest pain: Resolved  Acute bronchitis: Afebrile and no leukocytosis -Influenza panel neg -Duoneb q6 prn  -Prednisone taper started by ED on the 25th. Continue taper.  CAD -Aspirin 81 mg daily   GERD -Protonix 40 mg daily   FEN: 2 gm sodium   VTE ppx: Lovenox   Code: FULL  Dispo: Likely home today  The patient does have a current PCP [name], MD) and does need an [facility] hospital follow-up appointment after discharge.  The patient does not know have transportation  limitations that hinder transportation to clinic appointments.  .Services Needed at time of discharge: Y = Yes, Blank = No PT:   OT:   RN:   Equipment:   Other:     LOS: 2 days   Ethan Paula, MD 03/12/2015, 10:48 AM

## 2015-03-12 NOTE — Discharge Summary (Signed)
Name: Ethan Wilcox MRN: [id number] DOB: [birth year] 60 y.o. PCP: [name], MD  Date of Admission: 03/09/2015  5:05 PM Date of Discharge: 03/12/2015 Attending Physician: Doneen Poisson, MD  Discharge Diagnosis: 1. 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 Chest pain 3. Acute viral bronchitis  Discharge Medications:   Medication List    STOP taking these medications        isosorbide-hydrALAZINE 20-37.5 MG tablet  Commonly known as:  BIDIL     predniSONE 10 MG (21) Tbpk tablet  Commonly known as:  STERAPRED UNI-PAK 21 TAB      TAKE these medications        albuterol 108 (90 Base) MCG/ACT inhaler  Commonly known as:  PROVENTIL HFA;VENTOLIN HFA  Inhale 2 puffs into the lungs every 4 (four) hours as needed for wheezing or shortness of breath.     ASPIR-LOW 81 MG EC tablet  Generic drug:  aspirin  Take 81 mg by mouth daily.     carvedilol 25 MG tablet  Commonly known as:  COREG  Take 1 tablet (25 mg total) by mouth 2 (two) times daily.     chlorpheniramine-HYDROcodone 10-8 MG/5ML Suer  Commonly known as:  TUSSIONEX PENNKINETIC ER  Take 5 mLs by mouth at bedtime as needed for cough.     diphenhydrAMINE 25 MG tablet  Commonly known as:  BENADRYL  Take 25 mg by mouth every 6 (six) hours as needed for allergies.     EPINEPHrine 0.3 mg/0.3 mL Soaj injection  Commonly known as:  EPIPEN  Inject 0.3 mL (0.3 mg total) into the muscle once as needed (anaphylaxis).     furosemide 40 MG tablet  Commonly known as:  LASIX  Take 1.5 tablets (60 mg total) by mouth daily.     ibuprofen 600 MG tablet  Commonly known as:  ADVIL,MOTRIN  Take 1 tablet (600 mg total) by mouth every 6 (six) hours as needed.     losartan 100 MG tablet  Commonly known as:  COZAAR  Take 1 tablet (100 mg total) by mouth daily.     multivitamin with minerals Tabs tablet  Take 1 tablet by mouth daily.     omeprazole 40 MG capsule  Commonly known as:  PRILOSEC  Take  1 capsule (40 mg total) by mouth daily.     polyethylene glycol powder powder  Commonly known as:  GLYCOLAX/MIRALAX  Take 17 g by mouth daily. DISSOLVE 1 CAPFUL BY MOUTH DAILY AS DIRECTED     spironolactone 25 MG tablet  Commonly known as:  ALDACTONE  Take 1 tablet (25 mg total) by mouth daily.        Disposition and follow-up:   Ethan Wilcox was discharged from [facility] in Stable condition.  At the hospital follow up visit please address:  1.  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 His BiDil was discontinued due to headache. He was increased on his losartan to 100mg  daily. Discharge weight 239lb.  Atypical chest pain: Follow up recurrence of chest pain.  Acute viral bronchitis: Completed prednisone taper. He was discharged on albuterol inhaler prn.  2.  Labs / imaging needed at time of follow-up: none  3.  Pending labs/ test needing follow-up: none  Follow-up Appointments: Follow-up Information    Follow up with Selina Cooley, MD On 03/23/2015.   Specialty:  Internal Medicine   Why:  3:15 PM for hospital follow up   Contact information:   [address] [place] [address] [phone]       Discharge Instructions: Discharge Instructions    (HEART FAILURE PATIENTS) Call MD:  Anytime you have any of the following symptoms: 1) 3 pound weight gain in 24 hours or 5 pounds in 1 week 2) shortness of breath, with or without a dry hacking cough 3) swelling in the hands, feet or stomach 4) if you have to sleep on extra pillows at night in order to breathe.    Complete by:  As directed      Call MD for:  difficulty breathing, headache or visual disturbances    Complete by:  As directed      Call MD for:  extreme fatigue    Complete by:  As directed      Call MD for:  persistant dizziness or light-headedness    Complete by:  As directed      Call MD for:  persistant nausea  and vomiting    Complete by:  As directed      Call MD for:  severe uncontrolled pain    Complete by:  As directed      Call MD for:  temperature >100.4    Complete by:  As directed      Diet - low sodium heart healthy    Complete by:  As directed      Increase activity slowly    Complete by:  As directed            Consultations: None  Procedures Performed:  Dg Chest 2 View  03/09/2015  CLINICAL DATA:  Pt is having SOB, chest pain and slight dizziness x 3 days.Hx of CHF, HTN, nonischemic cardiomyopathy, ETOH abuse, smoker, obesity sleep apnea EXAM: CHEST  2 VIEW COMPARISON:  03/08/2015 FINDINGS: Mild cardiomegaly. No mediastinal or hilar masses or evidence of adenopathy. Diffuse peribronchial thickening without change from the prior study. No lung consolidation or overt edema. No pleural effusion or pneumothorax. Skeletal structures are intact. IMPRESSION: 1. No acute cardiopulmonary disease. No change from the previous day's study. Electronically Signed   By: Amie Portland M.D.   On: 03/09/2015 18:14   Dg Chest 2 View  03/08/2015  CLINICAL DATA:  Pt states he was tx here for a cough on 2/24. He states today he feels very weak. He also just started taking tussinex syrup today and is feeling relief from his cough with the medicine. Pt states dyspnea on exertion, hx: CHF, cardiomyopathy EXAM: CHEST  2 VIEW COMPARISON:  03/05/2015 FINDINGS: Midline trachea. Mild cardiomegaly. Mediastinal contours otherwise within normal limits. No pleural effusion or pneumothorax. Low lung volumes with resultant pulmonary interstitial prominence. No congestive failure. Diffuse peribronchial thickening. No lobar consolidation. IMPRESSION: Mild cardiomegaly and low lung volumes. No overt congestive failure or acute disease. Peribronchial thickening which may relate to chronic bronchitis or smoking. Electronically Signed   By: Jeronimo Greaves M.D.   On: 03/08/2015 19:41   Dg Chest 2 View  03/06/2015  CLINICAL DATA:   Acute onset of cough and congestion. Initial encounter. EXAM: CHEST  2 VIEW COMPARISON:  Chest radiograph from 07/21/2014 FINDINGS: The lungs are well-aerated. Peribronchial thickening is noted. Mildly increased interstitial markings may reflect mild interstitial edema. There is no evidence of pleural effusion or pneumothorax. The heart is borderline enlarged. No acute osseous abnormalities are seen. IMPRESSION: Borderline cardiomegaly. Peribronchial thickening noted. Mildly increased interstitial markings may reflect  mild interstitial edema. Electronically Signed   By: Roanna Raider M.D.   On: 03/06/2015 00:51    2D Echo:  - Left ventricle: The cavity size was moderately dilated. Systolic function was mildly to moderately reduced. The estimated ejection fraction was in the range of 40% to 45%. Diffuse hypokinesis. Doppler parameters are consistent with a reversible restrictive pattern, indicative of decreased left ventricular diastolic compliance and/or increased left atrial pressure (grade 3 diastolic dysfunction). Doppler parameters are consistent with high ventricular filling pressure. - Aortic valve: Transvalvular velocity was within the normal range. There was no stenosis. There was no regurgitation. - Mitral valve: Transvalvular velocity was within the normal range. There was no evidence for stenosis. There was mild regurgitation. - Left atrium: The atrium was severely dilated. - Right ventricle: The cavity size was normal. Wall thickness was normal. Systolic function was normal. - Atrial septum: No defect or patent foramen ovale was identified by color flow Doppler. - Tricuspid valve: There was no regurgitation. - Inferior vena cava: The vessel was normal in size. The respirophasic diameter changes were blunted (< 50%).   Admission HPI: 60 year old man with a history of non-ischemic cardiomyopathy, CHF, HTN, GERD, OSA, ethanol abuse, and cocaine abuse  presenting to the hospital with a 3 day history of cough, increased sputum production (brown colored) and wheezing. States he experiences substernal "burning pain" every time he coughs. Reports feeling feeling weak and SOB even when walking short distances (half a block). Patient believes he has been having fevers but no chills. Denies having any CP, palpitations, or diaphoresis. Denies any recent travel, history of blood clots, and leg/ calf pain or swelling. He does report having sick contacts at home (grandkids). Denies having any GERD symptoms. Reports having a "choking sensation" at night which wakes him up from sleep. He has a history of OSA and endorses non-adherence to CPAP. States he felt much better after receiving breathing treatments in the ED.   Patient recently visited the ED on 03/06/15 with a constellation of symptoms suggestive of a viral syndrome. He was discharged home with supportive care.   Hospital Course by problem list:   1. 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 He was given 60mg  IV Lasix in the ED. Diuresis was continued with IV Lasix BID. Echo was performed and showed EF of 40-45% and grade 3 diastolic dysfunction. He was negative 2.1L total in/out with weight prior to discharge was 239lb. He had resolution of symptoms. He was discharged on his home dose of PO Lasix 60mg  daily. He was continued on spironolactone 25 mg daily and Coreg 25 mg BID. His BiDil was discontinued due to headache. He was increased on his losartan to 100mg  daily.  2. Atypical chest pain: EKG with no acute ischemic changes. Troponin peaked at 0.16 then downtrended. There was no recurrence of chest pain during hospitalization.  3. Acute viral bronchitis: Remained afebrile and no leukocytosis at discharge. Influenza panel neg. He was continued on Duoneb q6 prn and prednisone taper started by ED on the 25th. He was discharged on albuterol inhaler  prn.  Discharge Vitals:   BP 110/71 mmHg  Pulse 102  Temp(Src) 98.2 F (36.8 C) (Oral)  Resp 20  Ht 5\' 6"  (1.676 m)  Wt 239 lb 8 oz (108.636 kg)  BMI 38.67 kg/m2  SpO2 92%  Discharge Labs:  Results for orders placed or performed during the hospital encounter of 03/09/15 (from the past 24 hour(s))  Glucose, capillary     Status: Abnormal   Collection Time: 03/12/15  6:01 AM  Result Value Ref Range   Glucose-Capillary 105 (H) 65 - 99 mg/dL   Comment 1 Notify RN    Comment 2 Document in Chart   Basic metabolic panel Once     Status: Abnormal   Collection Time: 03/12/15 10:10 AM  Result Value Ref Range   Sodium 135 135 - 145 mmol/L   Potassium 3.8 3.5 - 5.1 mmol/L   Chloride 92 (L) 101 - 111 mmol/L   CO2 28 22 - 32 mmol/L   Glucose, Bld 179 (H) 65 - 99 mg/dL   BUN 21 (H) 6 - 20 mg/dL   Creatinine, Ser 8.11 0.61 - 1.24 mg/dL   Calcium 9.5 8.9 - 91.4 mg/dL   GFR calc non Af Amer >60 >60 mL/min   GFR calc Af Amer >60 >60 mL/min   Anion gap 15 5 - 15    Signed: Lora Paula, MD 03/12/2015, 11:41 AM    Services Ordered on Discharge: None Equipment Ordered on Discharge: None

## 2015-03-22 ENCOUNTER — Telehealth: Payer: Self-pay | Admitting: *Deleted

## 2015-03-22 ENCOUNTER — Inpatient Hospital Stay (HOSPITAL_COMMUNITY)
Admission: EM | Admit: 2015-03-22 | Discharge: 2015-03-25 | DRG: 291 | Disposition: A | Discharge status: Home / Self Care | Payer: Medicaid Other | Attending: Oncology | Admitting: Oncology

## 2015-03-22 ENCOUNTER — Encounter (HOSPITAL_COMMUNITY): Payer: Self-pay | Admitting: Vascular Surgery

## 2015-03-22 ENCOUNTER — Emergency Department (HOSPITAL_COMMUNITY): Payer: Medicaid Other

## 2015-03-22 ENCOUNTER — Telehealth: Payer: Self-pay | Admitting: Internal Medicine

## 2015-03-22 DIAGNOSIS — J9620 Acute and chronic respiratory failure, unspecified whether with hypoxia or hypercapnia: Secondary | ICD-10-CM | POA: Diagnosis present

## 2015-03-22 DIAGNOSIS — I110 Hypertensive heart disease with heart failure: Secondary | ICD-10-CM | POA: Diagnosis not present

## 2015-03-22 DIAGNOSIS — R3129 Other microscopic hematuria: Secondary | ICD-10-CM | POA: Diagnosis present

## 2015-03-22 DIAGNOSIS — E669 Obesity, unspecified: Secondary | ICD-10-CM | POA: Diagnosis present

## 2015-03-22 DIAGNOSIS — Z880 Allergy status to penicillin: Secondary | ICD-10-CM

## 2015-03-22 DIAGNOSIS — J9811 Atelectasis: Secondary | ICD-10-CM | POA: Diagnosis present

## 2015-03-22 DIAGNOSIS — Z8249 Family history of ischemic heart disease and other diseases of the circulatory system: Secondary | ICD-10-CM

## 2015-03-22 DIAGNOSIS — M109 Gout, unspecified: Secondary | ICD-10-CM | POA: Diagnosis present

## 2015-03-22 DIAGNOSIS — I429 Cardiomyopathy, unspecified: Secondary | ICD-10-CM | POA: Diagnosis present

## 2015-03-22 DIAGNOSIS — E875 Hyperkalemia: Secondary | ICD-10-CM | POA: Diagnosis present

## 2015-03-22 DIAGNOSIS — K219 Gastro-esophageal reflux disease without esophagitis: Secondary | ICD-10-CM | POA: Diagnosis present

## 2015-03-22 DIAGNOSIS — I509 Heart failure, unspecified: Secondary | ICD-10-CM

## 2015-03-22 DIAGNOSIS — I2510 Atherosclerotic heart disease of native coronary artery without angina pectoris: Secondary | ICD-10-CM | POA: Diagnosis present

## 2015-03-22 DIAGNOSIS — F17210 Nicotine dependence, cigarettes, uncomplicated: Secondary | ICD-10-CM

## 2015-03-22 DIAGNOSIS — Z7982 Long term (current) use of aspirin: Secondary | ICD-10-CM | POA: Diagnosis not present

## 2015-03-22 DIAGNOSIS — Z79899 Other long term (current) drug therapy: Secondary | ICD-10-CM | POA: Diagnosis not present

## 2015-03-22 DIAGNOSIS — I10 Essential (primary) hypertension: Secondary | ICD-10-CM | POA: Diagnosis present

## 2015-03-22 DIAGNOSIS — Z91013 Allergy to seafood: Secondary | ICD-10-CM | POA: Diagnosis not present

## 2015-03-22 DIAGNOSIS — J209 Acute bronchitis, unspecified: Secondary | ICD-10-CM | POA: Diagnosis present

## 2015-03-22 DIAGNOSIS — F1010 Alcohol abuse, uncomplicated: Secondary | ICD-10-CM | POA: Diagnosis present

## 2015-03-22 DIAGNOSIS — B1920 Unspecified viral hepatitis C without hepatic coma: Secondary | ICD-10-CM | POA: Diagnosis present

## 2015-03-22 DIAGNOSIS — Z888 Allergy status to other drugs, medicaments and biological substances status: Secondary | ICD-10-CM

## 2015-03-22 DIAGNOSIS — G4733 Obstructive sleep apnea (adult) (pediatric): Secondary | ICD-10-CM | POA: Diagnosis present

## 2015-03-22 DIAGNOSIS — R0600 Dyspnea, unspecified: Secondary | ICD-10-CM

## 2015-03-22 DIAGNOSIS — R7989 Other specified abnormal findings of blood chemistry: Secondary | ICD-10-CM | POA: Diagnosis not present

## 2015-03-22 DIAGNOSIS — R894 Abnormal immunological findings in specimens from other organs, systems and tissues: Secondary | ICD-10-CM | POA: Diagnosis not present

## 2015-03-22 DIAGNOSIS — R778 Other specified abnormalities of plasma proteins: Secondary | ICD-10-CM | POA: Diagnosis present

## 2015-03-22 DIAGNOSIS — F1020 Alcohol dependence, uncomplicated: Secondary | ICD-10-CM

## 2015-03-22 DIAGNOSIS — F17200 Nicotine dependence, unspecified, uncomplicated: Secondary | ICD-10-CM | POA: Diagnosis present

## 2015-03-22 DIAGNOSIS — R55 Syncope and collapse: Secondary | ICD-10-CM | POA: Diagnosis present

## 2015-03-22 DIAGNOSIS — D72829 Elevated white blood cell count, unspecified: Secondary | ICD-10-CM

## 2015-03-22 DIAGNOSIS — I5043 Acute on chronic combined systolic (congestive) and diastolic (congestive) heart failure: Secondary | ICD-10-CM | POA: Diagnosis present

## 2015-03-22 LAB — CBC WITH DIFFERENTIAL/PLATELET
BASOS PCT: 0 %
Basophils Absolute: 0 10*3/uL (ref 0.0–0.1)
EOS PCT: 0 %
Eosinophils Absolute: 0 10*3/uL (ref 0.0–0.7)
HEMATOCRIT: 43.6 % (ref 39.0–52.0)
Hemoglobin: 14.2 g/dL (ref 13.0–17.0)
LYMPHS ABS: 1.8 10*3/uL (ref 0.7–4.0)
Lymphocytes Relative: 13 %
MCH: 30.2 pg (ref 26.0–34.0)
MCHC: 32.6 g/dL (ref 30.0–36.0)
MCV: 92.8 fL (ref 78.0–100.0)
MONOS PCT: 6 %
Monocytes Absolute: 0.8 10*3/uL (ref 0.1–1.0)
NEUTROS ABS: 11.3 10*3/uL — AB (ref 1.7–7.7)
Neutrophils Relative %: 81 %
Platelets: 373 10*3/uL (ref 150–400)
RBC: 4.7 MIL/uL (ref 4.22–5.81)
RDW: 13.5 % (ref 11.5–15.5)
WBC: 13.9 10*3/uL — ABNORMAL HIGH (ref 4.0–10.5)

## 2015-03-22 LAB — URINALYSIS, ROUTINE W REFLEX MICROSCOPIC
GLUCOSE, UA: NEGATIVE mg/dL
KETONES UR: NEGATIVE mg/dL
Leukocytes, UA: NEGATIVE
Nitrite: NEGATIVE
PH: 5.5 (ref 5.0–8.0)
Protein, ur: NEGATIVE mg/dL
SPECIFIC GRAVITY, URINE: 1.023 (ref 1.005–1.030)

## 2015-03-22 LAB — COMPREHENSIVE METABOLIC PANEL
ALT: 24 U/L (ref 17–63)
AST: 19 U/L (ref 15–41)
Albumin: 3.1 g/dL — ABNORMAL LOW (ref 3.5–5.0)
Alkaline Phosphatase: 98 U/L (ref 38–126)
Anion gap: 11 (ref 5–15)
BUN: 19 mg/dL (ref 6–20)
CHLORIDE: 99 mmol/L — AB (ref 101–111)
CO2: 26 mmol/L (ref 22–32)
CREATININE: 1.26 mg/dL — AB (ref 0.61–1.24)
Calcium: 9.2 mg/dL (ref 8.9–10.3)
GFR calc Af Amer: >60 mL/min (ref >60)
GLUCOSE: 131 mg/dL — AB (ref 65–99)
Potassium: 5.6 mmol/L — ABNORMAL HIGH (ref 3.5–5.1)
Sodium: 136 mmol/L (ref 135–145)
Total Bilirubin: 0.8 mg/dL (ref 0.3–1.2)
Total Protein: 7.8 g/dL (ref 6.5–8.1)

## 2015-03-22 LAB — URINE MICROSCOPIC-ADD ON

## 2015-03-22 LAB — RAPID URINE DRUG SCREEN, HOSP PERFORMED
Amphetamines: NOT DETECTED
Barbiturates: NOT DETECTED
Benzodiazepines: NOT DETECTED
COCAINE: NOT DETECTED
OPIATES: NOT DETECTED
TETRAHYDROCANNABINOL: POSITIVE — AB

## 2015-03-22 LAB — I-STAT ARTERIAL BLOOD GAS, ED
ACID-BASE EXCESS: 6 mmol/L — AB (ref 0.0–2.0)
Bicarbonate: 33.6 mEq/L — ABNORMAL HIGH (ref 20.0–24.0)
O2 SAT: 92 %
PCO2 ART: 57.9 mmHg — AB (ref 35.0–45.0)
PO2 ART: 68 mmHg — AB (ref 80.0–100.0)
TCO2: 35 mmol/L (ref 0–100)
pH, Arterial: 7.371 (ref 7.350–7.450)

## 2015-03-22 LAB — I-STAT TROPONIN, ED: Troponin i, poc: 0.17 ng/mL (ref 0.00–0.08)

## 2015-03-22 LAB — I-STAT CG4 LACTIC ACID, ED
LACTIC ACID, VENOUS: 1.78 mmol/L (ref 0.5–2.0)
LACTIC ACID, VENOUS: 1.22 mmol/L (ref 0.5–2.0)

## 2015-03-22 LAB — TROPONIN I: Troponin I: 0.14 ng/mL — ABNORMAL HIGH (ref <0.031)

## 2015-03-22 LAB — BRAIN NATRIURETIC PEPTIDE: B Natriuretic Peptide: 458.5 pg/mL — ABNORMAL HIGH (ref 0.0–100.0)

## 2015-03-22 LAB — POTASSIUM: Potassium: 5.8 mmol/L — ABNORMAL HIGH (ref 3.5–5.1)

## 2015-03-22 LAB — ETHANOL: Alcohol, Ethyl (B): <5 mg/dL (ref <5)

## 2015-03-22 MED ORDER — CARVEDILOL 25 MG PO TABS
25.0000 mg | ORAL_TABLET | Freq: Two times a day (BID) | ORAL | Status: DC
  Administered 2015-03-22 – 2015-03-25 (×6): 25 mg via ORAL
  Filled 2015-03-22 (×6): qty 1

## 2015-03-22 MED ORDER — SODIUM CHLORIDE 0.9% FLUSH
3.0000 mL | Freq: Two times a day (BID) | INTRAVENOUS | Status: DC
Start: 2015-03-22 — End: 2015-03-25
  Administered 2015-03-22 – 2015-03-25 (×6): 3 mL via INTRAVENOUS

## 2015-03-22 MED ORDER — ASPIRIN 81 MG PO TBEC: 81.0000 mg | DELAYED_RELEASE_TABLET | Freq: Every day | ORAL | Status: DC

## 2015-03-22 MED ORDER — ENOXAPARIN SODIUM 40 MG/0.4ML ~~LOC~~ SOLN
40.0000 mg | Freq: Every day | SUBCUTANEOUS | Status: DC
  Administered 2015-03-22: 40 mg via SUBCUTANEOUS
  Filled 2015-03-22: qty 0.4

## 2015-03-22 MED ORDER — SODIUM CHLORIDE 0.9% FLUSH: 3.0000 mL | INTRAVENOUS | Status: DC | PRN

## 2015-03-22 MED ORDER — ACETAMINOPHEN 325 MG PO TABS
650.0000 mg | ORAL_TABLET | Freq: Four times a day (QID) | ORAL | Status: DC | PRN
  Administered 2015-03-23 – 2015-03-25 (×4): 650 mg via ORAL
  Filled 2015-03-22 (×4): qty 2

## 2015-03-22 MED ORDER — ACETAMINOPHEN 650 MG RE SUPP: 650.0000 mg | Freq: Four times a day (QID) | RECTAL | Status: DC | PRN

## 2015-03-22 MED ORDER — POLYETHYLENE GLYCOL 3350 17 GM/SCOOP PO POWD
17.0000 g | Freq: Every day | ORAL | Status: DC
Start: 2015-03-23 — End: 2015-03-22
  Filled 2015-03-22: qty 255

## 2015-03-22 MED ORDER — ASPIRIN EC 81 MG PO TBEC
81.0000 mg | DELAYED_RELEASE_TABLET | Freq: Every day | ORAL | Status: DC
  Administered 2015-03-23 – 2015-03-25 (×3): 81 mg via ORAL
  Filled 2015-03-22 (×3): qty 1

## 2015-03-22 MED ORDER — DEXTROSE 5 % IV SOLN
1.0000 g | Freq: Once | INTRAVENOUS | Status: AC
  Administered 2015-03-22: 1 g via INTRAVENOUS
  Filled 2015-03-22: qty 10

## 2015-03-22 MED ORDER — FUROSEMIDE 10 MG/ML IJ SOLN
40.0000 mg | Freq: Every day | INTRAMUSCULAR | Status: DC
  Administered 2015-03-22 – 2015-03-23 (×2): 40 mg via INTRAVENOUS
  Filled 2015-03-22 (×2): qty 4

## 2015-03-22 MED ORDER — ALBUTEROL SULFATE HFA 108 (90 BASE) MCG/ACT IN AERS: 2.0000 | INHALATION_SPRAY | RESPIRATORY_TRACT | Status: DC | PRN

## 2015-03-22 MED ORDER — ALBUTEROL SULFATE (2.5 MG/3ML) 0.083% IN NEBU
2.5000 mg | INHALATION_SOLUTION | RESPIRATORY_TRACT | Status: DC | PRN
  Administered 2015-03-23: 2.5 mg via RESPIRATORY_TRACT
  Filled 2015-03-22 (×2): qty 3

## 2015-03-22 MED ORDER — PANTOPRAZOLE SODIUM 40 MG PO TBEC
80.0000 mg | DELAYED_RELEASE_TABLET | Freq: Every day | ORAL | Status: DC
  Administered 2015-03-23: 80 mg via ORAL
  Filled 2015-03-22: qty 2

## 2015-03-22 MED ORDER — POLYETHYLENE GLYCOL 3350 17 G PO PACK
17.0000 g | PACK | Freq: Every day | ORAL | Status: DC
  Administered 2015-03-25: 17 g via ORAL
  Filled 2015-03-22 (×3): qty 1

## 2015-03-22 NOTE — ED Provider Notes (Addendum)
CSN: [id number]     Arrival date & time 03/22/15  1535 History   First MD Initiated Contact with Patient 03/22/15 1622     Chief Complaint  Patient presents with  . Chest Pain  . Shortness of Breath   4:33 PM Patient not in room- positive troponin given to me.  Discussed with nurse and attempting to locate patient.   (Consider location/radiation/quality/duration/timing/severity/associated sxs/prior Treatment) HPI Is a 60 year old man with a history of cardiomyopathy, alcohol abuse, cocaine abuse, COPD, CHF who presents today with increased dyspnea since discharge March 3 after admission for COPD exacerbation.. He has been getting gradually more short of breath. Today he felt extremely weak and had a near syncopal episode. He came in secondary to the severe weakness. He has had some dyspnea but denies any chest pain. He is improved here on oxygen. He denies any fever or chills. He has some ongoing discolored sputum which she states has gotten lighter Past Medical History  Diagnosis Date  . Nonischemic cardiomyopathy (HCC)     admitted 5/11 w acute systolic CHF. LHC showed no angiographic CAD. Echo (5/11) showed EF 15-20% w diffuse sever hypokinesis, moderate MR> RHC showed PCWP 10, CI 1.47 (Fick.) cause of CMP thought to be heavy ETOH and cocaine. No ICD yet due to ongoing ETOH intake  . ETOH abuse   . Cocaine abuse   . Active smoker /    1/2 ppd  . Obesity   . Hypertension   . CHF (congestive heart failure) (HCC)   . Gout   . GERD (gastroesophageal reflux disease)   . Sleep apnea   . 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 No past surgeries     Family History  Problem Relation Age of Onset  . Heart disease Mother   . Cancer Mother   . Heart disease Father    Social History  Substance Use Topics  .  Smoking status: Current Every Day Smoker -- 0.75 packs/day for 38 years    Types: Cigarettes  . Smokeless tobacco: None     Comment: down to .5 pk.  about 1 week ago  . Alcohol Use: 7.2 oz/week    12 Shots of liquor per week     Comment: Vodka 1/2 pint - on/off.    Review of Systems  All other systems reviewed and are negative.     Allergies  Lisinopril; Shrimp; and Penicillins  Home Medications   Prior to Admission medications   Medication Sig Start Date End Date Taking? Authorizing Provider  albuterol (PROVENTIL HFA;VENTOLIN HFA) 108 (90 Base) MCG/ACT inhaler Inhale 2 puffs into the lungs every 4 (four) hours as needed for wheezing or shortness of breath. 03/12/15  Yes Lora Paula, MD  aspirin (ASPIR-LOW) 81 MG EC tablet Take 81 mg by mouth daily.    Yes Historical Provider, MD  carvedilol (COREG) 25 MG tablet Take 1 tablet (25 mg total) by mouth 2 (two) times daily. 02/10/15  Yes Alexa Lucrezia Starch, MD  diphenhydrAMINE (BENADRYL) 25 MG tablet Take 25 mg by mouth every 6 (six) hours as needed for allergies.   Yes Historical Provider, MD  EPINEPHrine (EPIPEN) 0.3 mg/0.3 mL SOAJ injection Inject 0.3 mL (0.3 mg total) into the muscle once as needed (anaphylaxis). 08/24/12  Yes Leroy Sea, MD  furosemide (  LASIX) 40 MG tablet Take 1.5 tablets (60 mg total) by mouth daily. 12/09/14  Yes Gust Rung, DO  ibuprofen (ADVIL,MOTRIN) 600 MG tablet Take 1 tablet (600 mg total) by mouth every 6 (six) hours as needed. 03/01/15  Yes Marjan Rabbani, MD  losartan (COZAAR) 100 MG tablet Take 1 tablet (100 mg total) by mouth daily. 03/12/15  Yes Lora Paula, MD  Multiple Vitamin (MULTIVITAMIN WITH MINERALS) TABS Take 1 tablet by mouth daily.   Yes Historical Provider, MD  omeprazole (PRILOSEC) 40 MG capsule Take 1 capsule (40 mg total) by mouth daily. 02/10/15  Yes Alexa Lucrezia Starch, MD  polyethylene glycol powder (GLYCOLAX/MIRALAX) powder Take 17 g by mouth daily. DISSOLVE 1 CAPFUL BY MOUTH DAILY AS  DIRECTED 02/10/15  Yes Alexa Lucrezia Starch, MD  spironolactone (ALDACTONE) 25 MG tablet Take 1 tablet (25 mg total) by mouth daily. 02/10/15  Yes Alexa Lucrezia Starch, MD   BP 92/70 mmHg  Pulse 107  Temp(Src) 99 F (37.2 C) (Oral)  Resp 18  Ht  (1.651 m)  Wt 111.313 kg  BMI 40.84 kg/m2  SpO2 90% Physical Exam  Constitutional: He appears well-developed. He appears distressed.  Obese  HENT:  Head: Normocephalic and atraumatic.  Right Ear: External ear normal.  Left Ear: External ear normal.  Nose: Nose normal.  Mouth/Throat: Oropharynx is clear and moist.  Eyes: Conjunctivae are normal. Pupils are equal, round, and reactive to light.  Neck: Normal range of motion. Neck supple.  Cardiovascular: Tachycardia present.   Nursing note and vitals reviewed.   ED Course  Procedures (including critical care time) Labs Review Labs Reviewed  CBC WITH DIFFERENTIAL/PLATELET - Abnormal; Notable for the following:    WBC 13.9 (*)    All other components within normal limits  I-STAT TROPOININ, ED - Abnormal; Notable for the following:    Troponin i, poc 0.17 (*)    All other components within normal limits  COMPREHENSIVE METABOLIC PANEL  URINALYSIS, ROUTINE W REFLEX MICROSCOPIC (NOT AT [facility])  I-STAT CG4 LACTIC ACID, ED    Imaging Review Dg Chest 2 View  03/22/2015  CLINICAL DATA:  Shortness of breath and chest pain EXAM: CHEST  2 VIEW COMPARISON:  March 09, 2015 FINDINGS: There is interstitial edema in the lower lung zones bilaterally. No airspace consolidation. There is a minimal left pleural effusion. There is cardiomegaly with mild pulmonary venous hypertension. No adenopathy. No bone lesions. IMPRESSION: Evidence a degree of congestive heart failure. No airspace consolidation. Electronically Signed   By: Bretta Bang III M.D.   On: 03/22/2015 16:46   I have personally reviewed and evaluated these images and lab results as part of my medical decision-making.   EKG  Interpretation   Date/Time:  Monday March 22 2015 15:47:18 EDT Ventricular Rate:  106 PR Interval:  136 QRS Duration: 84 QT Interval:  340 QTC Calculation: 451 R Axis:   44 Text Interpretation:  Sinus tachycardia Possible Anterior infarct , age  undetermined Abnormal ECG Confirmed by Sybilla Malhotra MD, Duwayne Heck [phone]) on  03/22/2015 4:21:58 PM      MDM    59 y.o. Male with multiple health problems presents today with near syncope.  He remains tachycardiac with bordeline hypotension.  He has recently been admitted for copd, but although dyspneic, lungs are clear on my exam.  He has some chf on cxr. Troponin elevated although no pain and ekg without acute ischemia.  Plan admission to medicine and cardiology consult.  1- near syncope  with borderline hypotension 2-elevated troponin-appears chronically elevated.  3- copd- appears clinically improved 4-hyperkalemia- 5.6-plan recheck 5-substance abuse- uds positive for thc   Margarita Grizzle, MD 03/22/15 1954  Margarita Grizzle, MD 03/22/15 2015  Discussed with Dr. Andrey Campanile- plan tele bed  Margarita Grizzle, MD 03/22/15 2018

## 2015-03-22 NOTE — Telephone Encounter (Signed)
APPT. REMINDER CALL, NO ANSWER/NO VOICE MAIL °

## 2015-03-22 NOTE — ED Notes (Signed)
Pt reports to the ED for eval of CP, SOB, and generalized weakness. Pt reports he was just admitted for a CHF exacerbation and d/c on Friday. Pt was d/c with home O2 but they d/c it and placed him on an inhaler instead. O2 83% on RA. Pt placed on 2 L. Pt reports chills and fevers up to 104. Pt also reports yellow productive cough. Pt A&OX4, resp e/u, and skin warm and dry.

## 2015-03-22 NOTE — ED Notes (Signed)
Attempted to call report

## 2015-03-22 NOTE — Telephone Encounter (Signed)
Received call from pt stating that he is having some shortness of breath. Unchanged since his discharge from hospital on 03/12/2015.  Pt denies swelling in hands, feet, or stomach, no weight gain- states he has lost weight, has cough that has gotten better since d/c. Using inhalers as instructed. Pt has hospital f/u appt tomorrow with Earnest Conroy. No appt avail today in [facility], pt instructed to keep appointment for tomorrow, but to go to ED should his symptoms worsen.Ethan Wilcox, Ethan Gildersleeve Cassady3/13/201710:16 AM

## 2015-03-22 NOTE — ED Notes (Signed)
Admitting at bedside 

## 2015-03-22 NOTE — H&P (Signed)
Date: 03/22/2015               Patient Name:  Ethan Wilcox MRN: [id number]  DOB: 10-01-1955 Age / Sex: 60 y.o., male   PCP: [name], MD         Medical Service: Internal Medicine Teaching Service         Attending Physician: Dr. Doneen Poisson, MD    First Contact: Veda Canning MS4 Pager: [phone]  Second Contact: Dr. 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 Patient was recently admitted from 2//28 to 3/3 with acute exacerbation of CHF and viral bronchitis. He was aggressively diuresised with improvement in his symptoms at that time. Generalized weakness and shortness of breath returned soon after discharge and have progressively worsened. Reports compliance with his home medications. Does report decreased urine output over the past week. Reports his weakness became acutely worse today and felt extremely weak and lightheaded. Never fell or lost consciousness. States he can only walk a few feet before become weak and dyspneic. Denies any leg edema. Also reports having visual disturbances that began today. Describes color changes when looking at object. No loss of vision, blurry vision or black spots. Denies any chest pain, palpitations, abdominal pain, nausea, vomiting, dysuria, fevers or chills. Does report continued cough with yellow sputum production.   Meds: No current facility-administered medications for this encounter.   Current Outpatient Prescriptions  Medication Sig Dispense Refill  . albuterol (PROVENTIL  HFA;VENTOLIN HFA) 108 (90 Base) MCG/ACT inhaler Inhale 2 puffs into the lungs every 4 (four) hours as needed for wheezing or shortness of breath. 1 Inhaler 1  . aspirin (ASPIR-LOW) 81 MG EC tablet Take 81 mg by mouth daily.     . carvedilol (COREG) 25 MG tablet Take 1 tablet (25 mg total) by mouth 2 (two) times daily. 180 tablet 2  . diphenhydrAMINE (BENADRYL) 25 MG tablet Take 25 mg by mouth every 6 (six) hours as needed for allergies.    [name] EPINEPHrine (EPIPEN) 0.3 mg/0.3 mL SOAJ injection Inject 0.3 mL (0.3 mg total) into the muscle once as needed (anaphylaxis). 1 Device 1  . furosemide (LASIX) 40 MG tablet Take 1.5 tablets (60 mg total) by mouth daily. 270 tablet 1  . ibuprofen (ADVIL,MOTRIN) 600 MG tablet Take 1 tablet (600 mg total) by mouth every 6 (six) hours as needed. 30 tablet 1  . losartan (COZAAR) 100 MG tablet Take 1 tablet (100 mg total) by mouth daily. 30 tablet 0  . Multiple Vitamin (MULTIVITAMIN WITH MINERALS) TABS Take 1 tablet by mouth daily.    [name] omeprazole (PRILOSEC) 40 MG capsule Take 1 capsule (40 mg total) by mouth daily. 90 capsule 2  . polyethylene glycol powder (GLYCOLAX/MIRALAX) powder Take 17 g by mouth daily. DISSOLVE 1 CAPFUL BY MOUTH DAILY AS DIRECTED 255 g 6  . spironolactone (ALDACTONE) 25 MG tablet Take 1 tablet (25 mg total) by mouth daily. 90 tablet 2    Allergies: Allergies as of 03/22/2015 - Review Complete 03/22/2015  Allergen  Reaction Noted  . Lisinopril Anaphylaxis 08/28/2012  . Shrimp [shellfish allergy] Anaphylaxis 10/19/2012  . Penicillins Other (See Comments) 06/04/2009   Past Medical History  Diagnosis Date  . Nonischemic cardiomyopathy (HCC)     admitted 5/11 w acute systolic CHF. LHC showed no angiographic CAD. Echo (5/11) showed EF 15-20% w diffuse sever hypokinesis, moderate MR> RHC showed PCWP 10, CI 1.47 (Fick.) cause of CMP thought to be heavy ETOH and cocaine. No ICD yet due to ongoing ETOH intake  . ETOH abuse   . Cocaine abuse   .  Active smoker /    1/2 ppd  . Obesity   . Hypertension   . CHF (congestive heart failure) (HCC)   . Gout   . GERD (gastroesophageal reflux disease)   . Sleep apnea   . 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 No past surgeries     Family History  Problem Relation Age of Onset  . Heart disease Mother   . Cancer Mother   . Heart disease Father    Social History   Social History  . Marital Status: Single    Spouse Name: N/A  . Number of Children: N/A  . Years of Education: N/A   Occupational History  . DISABLED    Social History Main Topics  . Smoking status: Current Every Day Smoker -- 0.75 packs/day for 38 years    Types: Cigarettes  . Smokeless tobacco: Not on file     Comment: down to .5 pk.  about 1 week ago  . Alcohol Use: 7.2 oz/week    12 Shots of liquor per week     Comment: Vodka 1/2 pint - on/off.  . Drug Use: No     Comment: former  . Sexual Activity: Not on file   Other Topics Concern  . Not on file   Social History Narrative   Works as Gaffer. 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 Heavy ETOH in past, now back to drinking 1/2 pint/week.          No show 07/16/09    Review of Systems: Pertinent items noted in HPI and remainder of comprehensive ROS otherwise negative.  Physical Exam: Blood pressure 105/88, pulse 100, temperature 99 F (37.2 C), temperature source Oral, resp. rate 19, height 5\' 5"  (1.651 m), weight 245 lb 6.4 oz (111.313 kg), SpO2 92 %. General: alert, well-developed, obese male sitting up in a chair in no acute distress Head: normocephalic and atraumatic.  Eyes: vision grossly intact, pupils equal, pupils round, pupils reactive to light, no injection and anicteric.  Mouth: pharynx pink and moist, no erythema, and no exudates.  Neck: supple, full ROM, no  thyromegaly, no JVD, and no carotid bruits.  Lungs: normal respiratory effort, no accessory muscle use, normal breath sounds, bibasilar crackles present, and no wheezes. Heart: normal rate, regular rhythm, 2/6 systolic murmur heard best at left upper sternal border, no gallop, and no rub.  Abdomen: soft, non-tender, normal bowel sounds, no distention, no guarding, no rebound tenderness Msk: no joint swelling, no joint warmth, and no redness over joints.  Pulses: 2+ DP/PT pulses bilaterally Extremities: No cyanosis, clubbing, no edema Neurologic: alert & oriented X3, cranial nerves II-XII intact, no focal deficits Skin: turgor normal and no rashes.  Psych: normal mood and affect  Lab results:  Basic Metabolic Panel:  Recent Labs  04/54/09 1557 03/22/15 2028  NA 136  --   K 5.6* 5.8*  CL 99*  --   CO2 26  --   GLUCOSE 131*  --   BUN 19  --   CREATININE 1.26*  --   CALCIUM 9.2  --    Liver Function Tests:  Recent Labs  03/22/15 1557  AST 19  ALT 24  ALKPHOS 98  BILITOT 0.8  PROT 7.8  ALBUMIN 3.1*   CBC:  Recent Labs  03/22/15 1557  WBC 13.9*  NEUTROABS 11.3*  HGB 14.2  HCT 43.6  MCV 92.8  PLT 373   Urine Drug Screen: Drugs of Abuse     Component Value Date/Time   LABOPIA NONE DETECTED 03/22/2015 1826   COCAINSCRNUR NONE DETECTED 03/22/2015 1826   LABBENZ NONE DETECTED 03/22/2015 1826   AMPHETMU NONE DETECTED 03/22/2015 1826   THCU POSITIVE* 03/22/2015 1826   LABBARB NONE DETECTED 03/22/2015 1826    Alcohol Level:  Recent Labs  03/22/15 1557  ETH <5   Urinalysis:  Recent Labs  03/22/15 1842  COLORURINE AMBER*  LABSPEC 1.023  PHURINE 5.5  GLUCOSEU NEGATIVE  HGBUR MODERATE*  BILIRUBINUR SMALL*  KETONESUR NEGATIVE  PROTEINUR NEGATIVE  NITRITE NEGATIVE  LEUKOCYTESUR NEGATIVE   Imaging results:  Dg Chest 2 View  03/22/2015  CLINICAL DATA:  Shortness of breath and chest pain EXAM: CHEST  2 VIEW COMPARISON:  March 09, 2015 FINDINGS: There  is interstitial edema in the lower lung zones bilaterally. No airspace consolidation. There is a minimal left pleural effusion. There is cardiomegaly with mild pulmonary venous hypertension. No adenopathy. No bone lesions. IMPRESSION: Evidence a degree of congestive heart failure. No airspace consolidation. Electronically Signed   By: Bretta Bang III M.D.   On: 03/22/2015 16:46   Other results: EKG: unchanged from previous tracings, sinus tachycardia.  Assessment & Plan by Problem:  Acute on Chronic Combined Systolic and Diastolic CHF Exacerbation: Patient with progressively worsening symptoms of shortness of breath and generalized weakness since discharge on 3/3. He reports that he has been compliant with his home lasix 60 mg daily and spironolactone 25 mg daily. Denies any worsening leg edema. States that his urine output has decreased the past week since admission and his generalized weakness has been worsening. Reports baseline SOB that is exacerbated with exertion. Reports being unable to walk a few feet without becoming weak and short of breath. Reports orthopnea and PND. Patient also reports some visual disturbances (color changes?) with some lightheadedness that began today. Did not fall or lose consciousness. Orthostatics negative. Denies any chest pain or palpations. K 5.8 on arrival, no EKG changes. Patient was aggressively diuresed last admission with 25lb weight loss. BNP 458 today, improved from last admission of 685. Patient's weight is up 6 lbs from discharge (245 lb today). UDS only positive for THC. Bibasilar crackles on exam but no leg edema present. Suspect symptoms secondary to volume overload and will improve with diuresis. Will likely need adjustment of his home diuretics at discharge with good follow up in clinic.   -Lasix 40 mg IV daily -Daily weights -Strict I&Os -Supplemental O2 keep sats >92% -Telemetry -BMET in am -Continue Coreg 25 mg daily -Continue ASA 81 mg  daily -O2 sats with ambulation tomorrow  Hyperkalemia: K 5.6 on arrival to ED, 5.8 on repeat. EKG with no peaked T waves, ST depression and normal QT. Patient denies any chest pain or palpitations. He does  take Lasix 60 mg daily at home as well as Spironolactone 25 mg and Losartan 100 mg daily (increased from 75 mg at 3/3 discharge). Patient reports that he has been taking a 1/2 pill of an old KDur prescription (presumably 40 mEq but patient unsure). Reports he thought since he was taking Lasix he needed the extra K as he has had problems with hypoK in the past while on Lasix. Patient asymptomatic at this time. Will diuresis patient with Lasix tonight and recheck in am.  -Lasix 40 mg IV daily -Hold losartan and spironolactone.  -Repeat BMET in am  Leukocytosis: WBC 13.6 on admission, neutrophil predominant. Patient is afebrile and denies any recent fevers or chills. Does report ongoing cough with yellow sputum production. Exam only notable for bibasilar crackles with no wheezes or rales. Patient was treated for a viral bronchitis at last admission with short course of prednisone and duonebs as needed. CXR with no consolidations concerning for PNA. Patient reports no urinary symptoms with UA unremarkable. Patient did receive one dose of Ceftriaxone in the ED. Low suspicion for infectious process at this time. Will stop antibiotics and continue to monitor.  -Albuterol prn  Chronically Elevated Troponin: Troponin 0.17 on arrival, 0.14 on repeat. Patient with a history of chronically mildly elevated troponin. Was elevated 0.16-0.17 during last admission. Patient denies any chest pain. EKG with no acute changes. Mild elevation felt to be secondary to his acute on chronic systolic and diastolic heart failure and not from ischemia.  -Trend troponin -Telemetry  HTN: BP soft in the ED. 92-127/70-97. Patient currently on losartan 100 mg daily coreg 25 mg bid, lasix 60 mg daily and spironolactone 25 mg daily.  Continue to monitor.  -Holding spironolactone and losartan given hyperK -Lasix per above  OSA: Patient with history of severe OSA. Non-compliant with CPAP at home.  -CPAP qhs  GERD: Patient takes Omeprazole 40 mg daily at home.  -Protonix 80 mg daily  ETOH Abuse: Reports drinking a fifth of liquor a week. Denies any history of withdrawal symptoms.   Tobacco Abuse: Reports smoking 3/4 ppd. Smoking for 40+ years. Counciled cessation.  -CIWA  DVT PPx: Lovenox  Dispo: Disposition is deferred at this time, awaiting improvement of current medical problems. Anticipated discharge in approximately 1-3 day(s).   The patient does have a current PCP [name], MD) and does need an [facility] hospital follow-up appointment after discharge.  The patient does not have transportation limitations that hinder transportation to clinic appointments.  Signed: Valentino Nose, MD 03/22/2015, 10:09 PM

## 2015-03-23 ENCOUNTER — Inpatient Hospital Stay (HOSPITAL_COMMUNITY): Payer: Medicaid Other

## 2015-03-23 ENCOUNTER — Ambulatory Visit: Payer: Medicaid Other | Admitting: Internal Medicine

## 2015-03-23 ENCOUNTER — Encounter (HOSPITAL_COMMUNITY): Payer: Self-pay | Admitting: Radiology

## 2015-03-23 DIAGNOSIS — J209 Acute bronchitis, unspecified: Secondary | ICD-10-CM

## 2015-03-23 DIAGNOSIS — Z79899 Other long term (current) drug therapy: Secondary | ICD-10-CM

## 2015-03-23 DIAGNOSIS — Z7982 Long term (current) use of aspirin: Secondary | ICD-10-CM

## 2015-03-23 DIAGNOSIS — I2510 Atherosclerotic heart disease of native coronary artery without angina pectoris: Secondary | ICD-10-CM

## 2015-03-23 DIAGNOSIS — I5043 Acute on chronic combined systolic (congestive) and diastolic (congestive) heart failure: Secondary | ICD-10-CM | POA: Insufficient documentation

## 2015-03-23 LAB — BASIC METABOLIC PANEL
Anion gap: 10 (ref 5–15)
BUN: 18 mg/dL (ref 6–20)
CHLORIDE: 96 mmol/L — AB (ref 101–111)
CO2: 30 mmol/L (ref 22–32)
CREATININE: 1.18 mg/dL (ref 0.61–1.24)
Calcium: 9 mg/dL (ref 8.9–10.3)
GFR calc Af Amer: >60 mL/min (ref >60)
GFR calc non Af Amer: >60 mL/min (ref >60)
Glucose, Bld: 135 mg/dL — ABNORMAL HIGH (ref 65–99)
Potassium: 4.6 mmol/L (ref 3.5–5.1)
SODIUM: 136 mmol/L (ref 135–145)

## 2015-03-23 LAB — URINE CULTURE
CULTURE: NO GROWTH
SPECIAL REQUESTS: NORMAL

## 2015-03-23 LAB — POCT I-STAT TROPONIN I: Troponin i, poc: 0.13 ng/mL (ref 0.00–0.08)

## 2015-03-23 LAB — TROPONIN I: Troponin I: 0.13 ng/mL — ABNORMAL HIGH (ref <0.031)

## 2015-03-23 LAB — PROCALCITONIN: Procalcitonin: <0.1 ng/mL

## 2015-03-23 MED ORDER — INFLUENZA VAC SPLIT QUAD 0.5 ML IM SUSY
0.5000 mL | PREFILLED_SYRINGE | INTRAMUSCULAR | Status: AC
  Administered 2015-03-25: 0.5 mL via INTRAMUSCULAR
  Filled 2015-03-23: qty 0.5

## 2015-03-23 MED ORDER — IPRATROPIUM-ALBUTEROL 0.5-2.5 (3) MG/3ML IN SOLN
3.0000 mL | Freq: Four times a day (QID) | RESPIRATORY_TRACT | Status: DC
  Administered 2015-03-23 – 2015-03-24 (×5): 3 mL via RESPIRATORY_TRACT
  Filled 2015-03-23 (×6): qty 3

## 2015-03-23 MED ORDER — FUROSEMIDE 10 MG/ML IJ SOLN
60.0000 mg | Freq: Two times a day (BID) | INTRAMUSCULAR | Status: DC
  Administered 2015-03-23 – 2015-03-24 (×2): 60 mg via INTRAVENOUS
  Filled 2015-03-23 (×2): qty 6

## 2015-03-23 MED ORDER — LOSARTAN POTASSIUM 50 MG PO TABS
100.0000 mg | ORAL_TABLET | Freq: Every day | ORAL | Status: DC
  Administered 2015-03-23 – 2015-03-25 (×3): 100 mg via ORAL
  Filled 2015-03-23 (×3): qty 2

## 2015-03-23 MED ORDER — CETYLPYRIDINIUM CHLORIDE 0.05 % MT LIQD
7.0000 mL | Freq: Two times a day (BID) | OROMUCOSAL | Status: DC
  Administered 2015-03-24 – 2015-03-25 (×2): 7 mL via OROMUCOSAL

## 2015-03-23 MED ORDER — IOHEXOL 350 MG/ML SOLN
100.0000 mL | Freq: Once | INTRAVENOUS | Status: AC | PRN
  Administered 2015-03-23: 100 mL via INTRAVENOUS

## 2015-03-23 MED ORDER — HEPARIN (PORCINE) IN NACL 100-0.45 UNIT/ML-% IJ SOLN
1450.0000 [IU]/h | INTRAMUSCULAR | Status: DC
  Administered 2015-03-23: 1450 [IU]/h via INTRAVENOUS
  Filled 2015-03-23: qty 250

## 2015-03-23 MED ORDER — SPIRONOLACTONE 25 MG PO TABS
25.0000 mg | ORAL_TABLET | Freq: Every day | ORAL | Status: DC
  Administered 2015-03-23 – 2015-03-25 (×3): 25 mg via ORAL
  Filled 2015-03-23 (×3): qty 1

## 2015-03-23 MED ORDER — HEPARIN BOLUS VIA INFUSION
5000.0000 [IU] | Freq: Once | INTRAVENOUS | Status: AC
  Administered 2015-03-23: 5000 [IU] via INTRAVENOUS
  Filled 2015-03-23: qty 5000

## 2015-03-23 MED ORDER — FUROSEMIDE 10 MG/ML IJ SOLN: 40.0000 mg | Freq: Two times a day (BID) | INTRAMUSCULAR | Status: DC

## 2015-03-23 MED ORDER — PANTOPRAZOLE SODIUM 40 MG PO TBEC
40.0000 mg | DELAYED_RELEASE_TABLET | Freq: Every day | ORAL | Status: DC
  Administered 2015-03-24 – 2015-03-25 (×2): 40 mg via ORAL
  Filled 2015-03-23 (×2): qty 1

## 2015-03-23 NOTE — Significant Event (Signed)
Rapid Response Event Note  Overview: Time Called: 0828 Arrival Time: 0830    Initial Focused Assessment: Patient in respiratory distress, sitting on the side of the bed, purse lip breathing, diaphoretic, intermittently anxious.   BP 109/58  SR 99  RR 32  O2 sat 94% on 4l  Temp 98.8 Lung sounds expiratory wheezes, crackles.  Heart tones regular. Abd distended and tight. Patient is Alert and oriented.  Interventions: PRN albuterol treatment given New IV start Am lasix given early (40mg  IV) Resident at bedside. Patient still with increased RR (30s) but he states he feels better denies any chest pain. Lung sounds with rhonchi.  Productive cough. BP 119/52  SR 85,  RR 28-30s, O2 sat 97% on 4l Will continue to monitor.  RN to call if assistance needed.   Event Summary: Name of Physician Notified: Resident at [phone]    at    Outcome: Stayed in room and stabalized     Ethan Wilcox

## 2015-03-23 NOTE — Progress Notes (Signed)
Subjective: Episode of acute respiratory distress this morning. Received IV lasix and albuterol with improvement in his symptoms. Denies chest pain.   Objective: Vital signs in last 24 hours: Filed Vitals:   03/23/15 0753 03/23/15 0817 03/23/15 0850 03/23/15 1110  BP: 123/89 109/58 119/52 126/75  Pulse: 94 102 85 83  Temp: 98.6 F (37 C)   98.7 F (37.1 C)  TempSrc: Oral   Oral  Resp:  28 32 22  Height:      Weight:      SpO2: 94% 94% 97% 92%   Weight change:   Intake/Output Summary (Last 24 hours) at 03/23/15 1400 Last data filed at 03/23/15 1324  Gross per 24 hour  Intake    480 ml  Output   2075 ml  Net  -1595 ml   General Apperance: NAD HEENT: Normocephalic, atraumatic, anicteric sclera Neck: Supple, trachea midline, unable to evaluate JVD due to body habitus Lungs: Rales at bilateral bases. Wheezes throughout.  Heart: Regular rate and rhythm, no murmur/rub/gallop Abdomen: Soft, nontender, mildly distended, no rebound/guarding Extremities: Warm and well perfused, no edema Skin: No rashes or lesions Neurologic: Alert and interactive. No gross deficits.  Lab Results: Basic Metabolic Panel:  Recent Labs Lab 03/22/15 1557 03/22/15 2028 03/23/15 0419  NA 136  --  136  K 5.6* 5.8* 4.6  CL 99*  --  96*  CO2 26  --  30  GLUCOSE 131*  --  135*  BUN 19  --  18  CREATININE 1.26*  --  1.18  CALCIUM 9.2  --  9.0   CBC:  Recent Labs Lab 03/22/15 1557  WBC 13.9*  NEUTROABS 11.3*  HGB 14.2  HCT 43.6  MCV 92.8  PLT 373   Cardiac Enzymes:  Recent Labs Lab 03/22/15 2239 03/23/15 0419  TROPONINI 0.14* 0.13*   BNP:    Component Value Date/Time   BNP 458.5* 03/22/2015 2239   Urine Drug Screen: Drugs of Abuse     Component Value Date/Time   LABOPIA NONE DETECTED 03/22/2015 1826   COCAINSCRNUR NONE DETECTED 03/22/2015 1826   LABBENZ NONE DETECTED 03/22/2015 1826   AMPHETMU NONE DETECTED 03/22/2015 1826   THCU POSITIVE* 03/22/2015 1826   LABBARB  NONE DETECTED 03/22/2015 1826    Medications: I have reviewed the patient's current medications. Scheduled Meds: . antiseptic oral rinse  7 mL Mouth Rinse BID  . aspirin EC  81 mg Oral Daily  . carvedilol  25 mg Oral BID  . furosemide  60 mg Intravenous BID  . pantoprazole  80 mg Oral Daily  . polyethylene glycol  17 g Oral Daily  . sodium chloride flush  3 mL Intravenous Q12H   Continuous Infusions:  PRN Meds:.acetaminophen **OR** acetaminophen, albuterol, sodium chloride flush   Assessment/Plan: 60 year old man with nonischemic cardiomyopathy, CHF, HTN, GERD, OSA, ethanol abuse, and cocaine abuse presenting to the hospital with a 3 day history of cough, increased sputum production (brown colored) and wheezing.  Acute exacerbation of chronic combined systolic and diastolic CHF: Suspect he needs higher dose of Lasix at home. Echo from 05/2013 with ejection fraction 30-35%, diffuse hypokinesis, grade 1 diastolic dysfunction. Repeat echo 03/10/2015 with EF 40-45%, diffuse hypokinesis and grade 3 diastolic dysfunction. -IV Lasix 60 mg BID. -Spironolactone 25 mg daily  -Coreg 25 mg BID -Losartan 100 mg daily  -Daily weights -Strict I&Os   Acute bronchitis: Afebrile. WBC 13.9 but had been on a steroid taper. Consolidation concerning for atelectasis vs pneumonia on  CTA chest. No PE.  -Obtain Procalcitonin -Duoneb q6 hr  -Pulm toilet with IS  Microscopic hematuria: Hgb noted on UA. -UCx pending. -Will need repeat UA at follow up.  CAD -Aspirin 81 mg daily   GERD -Protonix 40 mg daily   FEN: 2 gm sodium   VTE ppx: Lovenox   Code: FULL  Dispo: Awaiting improvement of acute medical problems.  The patient does have a current PCP [name], MD) and does need an [facility] hospital follow-up appointment after discharge.  The patient does not know have transportation limitations that hinder transportation to clinic appointments.  .Services Needed at time of discharge: Y = Yes,  Blank = No PT:   OT:   RN:   Equipment:   Other:     LOS: 1 day   Lora Paula, MD 03/23/2015, 1:59 PM

## 2015-03-23 NOTE — Progress Notes (Signed)
Subjective: Patient had some difficulty breathing this AM. He received IV lasix and albuterol which improved his symptoms. He denies fevers, chest pain Objective: Vital signs in last 24 hours: Filed Vitals:   03/23/15 0753 03/23/15 0817 03/23/15 0850 03/23/15 1110  BP: 123/89 109/58 119/52 126/75  Pulse: 94 102 85 83  Temp: 98.6 F (37 C)   98.7 F (37.1 C)  TempSrc: Oral   Oral  Resp:  28 32 22  Height:      Weight:      SpO2: 94% 94% 97% 92%   Weight change:   Intake/Output Summary (Last 24 hours) at 03/23/15 1445 Last data filed at 03/23/15 1324  Gross per 24 hour  Intake    480 ml  Output   2075 ml  Net  -1595 ml   BP 126/75 mmHg  Pulse 83  Temp(Src) 98.7 F (37.1 C) (Oral)  Resp 22  Ht  (1.651 m)  Wt 108.546 kg (239 lb 4.8 oz)  BMI 39.82 kg/m2  SpO2 92% General Apperance: NAD HEENT: Normocephalic, atraumatic, anicteric sclera Neck: Supple, trachea midline, unable to evaluate JVD due to body habitus Lungs: Rales at bilateral bases. Wheezes throughout.  Heart: Regular rate and rhythm, no murmur/rub/gallop Abdomen: Soft, nontender, mildly distended, no rebound/guarding Extremities: Warm and well perfused, no edema Skin: No rashes or lesions Neurologic: Alert and interactive. No gross deficits.  Lab Results: Basic Metabolic Panel:  Recent Labs  40/98/11 1557 03/22/15 2028 03/23/15 0419  NA 136  --  136  K 5.6* 5.8* 4.6  CL 99*  --  96*  CO2 26  --  30  GLUCOSE 131*  --  135*  BUN 19  --  18  CREATININE 1.26*  --  1.18  CALCIUM 9.2  --  9.0   Liver Function Tests:  Recent Labs  03/22/15 1557  AST 19  ALT 24  ALKPHOS 98  BILITOT 0.8  PROT 7.8  ALBUMIN 3.1*   CBC:  Recent Labs  03/22/15 1557  WBC 13.9*  NEUTROABS 11.3*  HGB 14.2  HCT 43.6  MCV 92.8  PLT 373   Cardiac Enzymes:  Recent Labs  03/22/15 2239 03/23/15 0419  TROPONINI 0.14* 0.13*   Urine Drug Screen: Drugs of Abuse     Component Value Date/Time   LABOPIA  NONE DETECTED 03/22/2015 1826   COCAINSCRNUR NONE DETECTED 03/22/2015 1826   LABBENZ NONE DETECTED 03/22/2015 1826   AMPHETMU NONE DETECTED 03/22/2015 1826   THCU POSITIVE* 03/22/2015 1826   LABBARB NONE DETECTED 03/22/2015 1826    Alcohol Level:  Recent Labs  03/22/15 1557  ETH <5   Urinalysis:  Recent Labs  03/22/15 1842  COLORURINE AMBER*  LABSPEC 1.023  PHURINE 5.5  GLUCOSEU NEGATIVE  HGBUR MODERATE*  BILIRUBINUR SMALL*  KETONESUR NEGATIVE  PROTEINUR NEGATIVE  NITRITE NEGATIVE  LEUKOCYTESUR NEGATIVE    Micro Results: Recent Results (from the past 240 hour(s))  Urine culture     Status: None (Preliminary result)   Collection Time: 03/22/15  8:41 PM  Result Value Ref Range Status   Specimen Description URINE, CLEAN CATCH  Final   Special Requests Normal  Final   Culture NO GROWTH < 24 HOURS  Final   Report Status PENDING  Incomplete   Studies/Results: Dg Chest 2 View  03/22/2015  CLINICAL DATA:  Shortness of breath and chest pain EXAM: CHEST  2 VIEW COMPARISON:  March 09, 2015 FINDINGS: There is interstitial edema in the lower lung zones  bilaterally. No airspace consolidation. There is a minimal left pleural effusion. There is cardiomegaly with mild pulmonary venous hypertension. No adenopathy. No bone lesions. IMPRESSION: Evidence a degree of congestive heart failure. No airspace consolidation. Electronically Signed   By: Bretta Bang III M.D.   On: 03/22/2015 16:46   Ct Angio Chest Pe W/cm &/or Wo Cm  03/23/2015  CLINICAL DATA:  Shortness of breath for 10 days with cough, initial encounter EXAM: CT ANGIOGRAPHY CHEST WITH CONTRAST TECHNIQUE: Multidetector CT imaging of the chest was performed using the standard protocol during bolus administration of intravenous contrast. Multiplanar CT image reconstructions and MIPs were obtained to evaluate the vascular anatomy. CONTRAST:  OMNIPAQUE IOHEXOL 350 MG/ML SOLN COMPARISON:  None. FINDINGS: Lungs are well  aerated bilaterally. Patchy bibasilar infiltrates are seen within the lower lobes and right middle lobe. Mild interstitial changes are noted likely related to a degree of interstitial edema. The thoracic inlet is within normal limits. Aortic calcifications are noted without aneurysmal dilatation. The pulmonary artery shows a normal branching pattern without filling defect to suggest pulmonary embolism. There are small hilar lymph nodes particularly on the right measuring 16 mm in short axis on image number 169 of series 5. Scattered small mediastinal lymph nodes are noted as well. These are likely reactive in nature. The visualized upper abdomen shows no acute abnormality. No osseous abnormality is seen. Review of the MIP images confirms the above findings. IMPRESSION: Diffuse interstitial edema with superimposed patchy pneumonic infiltrates in the bases bilaterally. No evidence of pulmonary emboli. Reactive lymphadenopathy particularly in the right hilum. Electronically Signed   By: Alcide Clever M.D.   On: 03/23/2015 12:03   Medications: I have reviewed the patient's current medications. Scheduled Meds: . antiseptic oral rinse  7 mL Mouth Rinse BID  . aspirin EC  81 mg Oral Daily  . carvedilol  25 mg Oral BID  . furosemide  60 mg Intravenous BID  . [START ON 03/24/2015] Influenza vac split quadrivalent PF  0.5 mL Intramuscular Tomorrow-1000  . ipratropium-albuterol  3 mL Nebulization Q6H  . losartan  100 mg Oral Daily  . [START ON 03/24/2015] pantoprazole  40 mg Oral Daily  . polyethylene glycol  17 g Oral Daily  . sodium chloride flush  3 mL Intravenous Q12H  . spironolactone  25 mg Oral Daily   Continuous Infusions:  PRN Meds:.acetaminophen **OR** acetaminophen, albuterol, sodium chloride flush Assessment/Plan: Principal Problem:   Acute exacerbation of CHF (congestive heart failure) (HCC) Active Problems:   Alcohol abuse   TOBACCO ABUSE   HYPERTENSION   Severe OSA   GERD (gastroesophageal  reflux disease)   Elevated troponin   Hyperkalemia   Acute on chronic combined systolic and diastolic congestive heart failure (HCC)  60 year old man with nonischemic cardiomyopathy, CHF, HTN, GERD, OSA, ethanol abuse, and cocaine abuse presenting to the hospital with a 3 day history of cough, increased sputum production (brown colored) and wheezing.  Acute exacerbation of chronic combined systolic and diastolic CHF: Suspect he needs higher dose of Lasix at home. Echo from 05/2013 with ejection fraction 30-35%, diffuse hypokinesis, grade 1 diastolic dysfunction. Repeat echo 03/10/2015 with EF 40-45%, diffuse hypokinesis and grade 3 diastolic dysfunction. -IV Lasix 60 mg BID. -Spironolactone 25 mg daily  -Coreg 25 mg BID -Losartan 100 mg daily  -Daily weights -Strict I&Os  Acute bronchitis: Afebrile. WBC 13.9 but had been on a steroid taper. Consolidation concerning for atelectasis vs pneumonia on CTA chest. No PE.  -Obtain Procalcitonin -  Duoneb q6 hr  -Pulm toilet with IS  Microscopic hematuria: Hgb noted on UA. -UCx pending. -Will need repeat UA at follow up.  CAD -Aspirin 81 mg daily   GERD -Protonix 40 mg daily   FEN: 2 gm sodium   VTE ppx: Lovenox   Code: FULL  Dispo: Awaiting improvement of acute medical problems  This is a Psychologist, occupational Note.  The care of the patient was discussed with Dr. Isabella Bowens and the assessment and plan formulated with their assistance.  Please see their attached note for official documentation of the daily encounter.   LOS: 1 day   Veda Canning, Med Student 03/23/2015, 2:45 PM

## 2015-03-23 NOTE — Progress Notes (Signed)
Utilization review completed. Mariyana Fulop, RN, BSN. 

## 2015-03-23 NOTE — Progress Notes (Signed)
Pt HR up to 140s-160, ST per monitor tech. Pt in room restless, anxious stating he is short of breath. O2 91% on 4Lnc. O2 94% on 6Lnc. CIWA performed to be an 8 d/t anxiety and agitation. Pt with no complaints of pain, dizziness or palpitations. MD on call notified. Breath sounds clear and diminished.   Pt states he feels better now, less short of breath, still restless. HR 101-105. Pt states his last drink was 1.5 weeks ago and has been seeing "shadows" occasionally. 05:30  Pt HR up to 150-160 again, BP 117/90 this AM, pt c/o lightheadedness and shortness of breath. Pt sweating and states he is anxious, EKG ordered and performed. MD at bedside to see patient and placed pt on 4Lnc 07:00

## 2015-03-23 NOTE — Progress Notes (Signed)
ANTICOAGULATION CONSULT NOTE - Initial Consult  Pharmacy Consult for Heparin  Indication: r/o PE  Allergies  Allergen Reactions  . Lisinopril Anaphylaxis    Possible cause of anaphylaxis episode on 08/24/2012 which required admission.  . Shrimp [Shellfish Allergy] Anaphylaxis  . Penicillins Other (See Comments)    unknowN    Patient Measurements: Height: 5\' 5"  (165.1 cm) Weight: 239 lb 4.8 oz (108.546 kg) IBW/kg (Calculated) : 61.5 Heparin Dosing Weight: 88 kg  Vital Signs: Temp: 98.6 F (37 C) (03/14 0753) Temp Source: Oral (03/14 0753) BP: 119/52 mmHg (03/14 0850) Pulse Rate: 85 (03/14 0850)  Labs:  Recent Labs  03/22/15 1557 03/22/15 2239 03/23/15 0419  HGB 14.2  --   --   HCT 43.6  --   --   PLT 373  --   --   CREATININE 1.26*  --  1.18  TROPONINI  --  0.14* 0.13*    Estimated Creatinine Clearance: 77.5 mL/min (by C-G formula based on Cr of 1.18).   Assessment: 16 yoM with PMHx of nonischemic cardiomyopathy and combined systolic and diastolic, HF admitted 3/13 pm with weakness and SOB. Since admission has remained tachy, SOB and light headed, and primary team concerned for acute PE. Heparin per pharmacy.  No known anticoagulation PTA, CBC stable, lost dose of Lovenox for VTE prophylaxis at 23:30 on 3/13.    Goal of Therapy:  Heparin level 0.3-0.7 units/ml Monitor platelets by anticoagulation protocol: Yes   Plan:  1. Give 5000 units bolus x 1 2. Start heparin infusion at 1450 units/hr 3. Check anti-Xa level in 6 hours and daily while on heparin 4. Continue to monitor H&H and platelets  5. Stop Lovenox     Pollyann Samples, PharmD, BCPS 03/23/2015, 10:35 AM Pager: [phone]

## 2015-03-23 NOTE — Progress Notes (Signed)
Patient sitting on the side of the bed with labored, purse lip breathing, feeling short of breath, and diaphoretic.  Oxygen saturation 94% on 4L.  HR 102.  Lung sounds with crackles and rhonchii bilaterally.  Lasix given after new IV established and breathing treatment given.  Rapid response and MD at bedside.  After breathing treatment and lasix, patient feeling less short of breath and oxygen saturation 96% on 4L.  Will continue to monitor patient.

## 2015-03-24 DIAGNOSIS — I5043 Acute on chronic combined systolic (congestive) and diastolic (congestive) heart failure: Secondary | ICD-10-CM

## 2015-03-24 DIAGNOSIS — R3129 Other microscopic hematuria: Secondary | ICD-10-CM

## 2015-03-24 DIAGNOSIS — J9811 Atelectasis: Secondary | ICD-10-CM

## 2015-03-24 DIAGNOSIS — R7989 Other specified abnormal findings of blood chemistry: Secondary | ICD-10-CM

## 2015-03-24 LAB — BASIC METABOLIC PANEL
Anion gap: 12 (ref 5–15)
BUN: 18 mg/dL (ref 6–20)
CALCIUM: 9.2 mg/dL (ref 8.9–10.3)
CO2: 31 mmol/L (ref 22–32)
CREATININE: 1.09 mg/dL (ref 0.61–1.24)
Chloride: 93 mmol/L — ABNORMAL LOW (ref 101–111)
GFR calc non Af Amer: >60 mL/min (ref >60)
Glucose, Bld: 204 mg/dL — ABNORMAL HIGH (ref 65–99)
Potassium: 4 mmol/L (ref 3.5–5.1)
SODIUM: 136 mmol/L (ref 135–145)

## 2015-03-24 LAB — CBC
HCT: 43 % (ref 39.0–52.0)
Hemoglobin: 14.4 g/dL (ref 13.0–17.0)
MCH: 30.5 pg (ref 26.0–34.0)
MCHC: 33.5 g/dL (ref 30.0–36.0)
MCV: 91.1 fL (ref 78.0–100.0)
PLATELETS: 350 10*3/uL (ref 150–400)
RBC: 4.72 MIL/uL (ref 4.22–5.81)
RDW: 13.3 % (ref 11.5–15.5)
WBC: 11.3 10*3/uL — AB (ref 4.0–10.5)

## 2015-03-24 MED ORDER — FUROSEMIDE 10 MG/ML IJ SOLN: 60.0000 mg | Freq: Two times a day (BID) | INTRAMUSCULAR | Status: DC

## 2015-03-24 MED ORDER — IPRATROPIUM-ALBUTEROL 0.5-2.5 (3) MG/3ML IN SOLN
3.0000 mL | Freq: Two times a day (BID) | RESPIRATORY_TRACT | Status: DC
  Filled 2015-03-24: qty 3

## 2015-03-24 NOTE — Progress Notes (Signed)
Patient refusing CPAP for tonight, stated he did not like how it made him feel. Felt Dizzy and bad with it. RT made patient aware that if he changed his mind to call.

## 2015-03-24 NOTE — Progress Notes (Signed)
S: Mr. Ruoff is feeling much better this morning stating that his breathing is improved.  He realizes how smoking and drinking have damaged his heart and he states he is committed to quitting starting while he is an inpatient.  Physical Exam:  Filed Vitals:   03/24/15 0437 03/24/15 0855 03/24/15 1145 03/24/15 1401  BP: 100/77  112/81   Pulse: 87  93   Temp: 98.1 F (36.7 C)  98.1 F (36.7 C)   TempSrc: Oral  Oral   Resp: 21  18   Height:      Weight: 237 lb 8 oz (107.729 kg)     SpO2: 95% 90% 93% 90%   Gen.: Well-developed, well-nourished, man lying on his right side flattening bed in no acute distress. Lungs: Bibasilar inspiratory crackles without wheezes, rhonchi, or rales. Heart: Regular rate and rhythm without murmurs, rubs, or gallops. Abdomen: Soft, nontender, active bowel sounds. Extremities: Without edema.  Lab results:  Basic Metabolic Panel:  Recent Labs  01/75/10 0419 03/24/15 0400  NA 136 136  K 4.6 4.0  CL 96* 93*  CO2 30 31  GLUCOSE 135* 204*  BUN 18 18  CREATININE 1.18 1.09  CALCIUM 9.0 9.2   CBC:  Recent Labs  03/22/15 1557 03/24/15 0400  WBC 13.9* 11.3*  NEUTROABS 11.3*  --   HGB 14.2 14.4  HCT 43.6 43.0  MCV 92.8 91.1  PLT 373 350   Assessment/Plan  1) Acute on chronic systolic and diastolic heart failure: Mr. Ladehoff is diuresing well. His weight is 237 pounds this morning which is 2 pounds below his discharge weight last week. His renal function is actually improving with a stable BUN and a slightly decreased creatinine. Therefore, I believe he's got some more room to move with regards to diuresis. We will continue the Lasix at 60 mg IV twice a day throughout today and reassess in the morning. We will also continue with our aggressive afterload reduction.  2) Atelectasis: He has significant dependent atelectasis on CT scan and is encouraged to utilize his incentive spirometer while an inpatient.  3) Mildly elevated troponin: It has trended  down and is likely related to his decompensated acute on chronic systolic and diastolic heart failure. That being said, he may benefit from an outpatient cardiac risk stratification once his heart failure regimen is optimized.  4) Microscopic hematuria: We will repeat the urinalysis in 3-4 weeks to assess whether or not he continues to have microscopic hematuria. If so further evaluation will be initiated.  5) Disposition: Pending his clinical response to aggressive diuresis. I suspect he will require inpatient hospitalization for at least another 48-72 hours.

## 2015-03-24 NOTE — Progress Notes (Signed)
Subjective: [name] did well overnight with no acute complaints. He states that he is able to breathe better than on admission and is able to ambulate short distances without difficulty. He denies chest pain, fevers, chills.  Objective: Vital signs in last 24 hours: Filed Vitals:   03/24/15 0035 03/24/15 0437 03/24/15 0855 03/24/15 1145  BP: 97/65 100/77  112/81  Pulse: 62 87  93  Temp: 98.3 F (36.8 C) 98.1 F (36.7 C)  98.1 F (36.7 C)  TempSrc: Oral Oral  Oral  Resp: 22 21  18   Height:      Weight:  107.729 kg (237 lb 8 oz)    SpO2: 98% 95% 90% 93%   Weight change: -2.767 kg (-6 lb 1.6 oz)  Intake/Output Summary (Last 24 hours) at 03/24/15 1300 Last data filed at 03/24/15 1145  Gross per 24 hour  Intake 1334.27 ml  Output   1150 ml  Net 184.27 ml   BP 112/81 mmHg  Pulse 93  Temp(Src) 98.1 F (36.7 C) (Oral)  Resp 18  Ht 5\' 5"  (1.651 m)  Wt 107.729 kg (237 lb 8 oz)  BMI 39.52 kg/m2  SpO2 93% General Apperance: NAD HEENT: Normocephalic, atraumatic, anicteric sclera Neck: Supple, trachea midline, unable to evaluate JVD due to body habitus Lungs: Rales at bilateral bases. Wheezes throughout.  Heart: Regular rate and rhythm, no murmur/rub/gallop Abdomen: Soft, nontender, mildly distended, no rebound/guarding Extremities: Warm and well perfused, no edema Skin: No rashes or lesions Neurologic: Alert and interactive. No gross deficits.  Lab Results: Basic Metabolic Panel:  Recent Labs  91/47/82 0419 03/24/15 0400  NA 136 136  K 4.6 4.0  CL 96* 93*  CO2 30 31  GLUCOSE 135* 204*  BUN 18 18  CREATININE 1.18 1.09  CALCIUM 9.0 9.2   Liver Function Tests:  Recent Labs  03/22/15 1557  AST 19  ALT 24  ALKPHOS 98  BILITOT 0.8  PROT 7.8  ALBUMIN 3.1*   CBC:  Recent Labs  03/22/15 1557 03/24/15 0400  WBC 13.9* 11.3*  NEUTROABS 11.3*  --   HGB 14.2 14.4  HCT 43.6 43.0  MCV 92.8 91.1  PLT 373 350   Cardiac Enzymes:  Recent Labs   03/22/15 2239 03/23/15 0419  TROPONINI 0.14* 0.13*   Urine Drug Screen: Drugs of Abuse     Component Value Date/Time   LABOPIA NONE DETECTED 03/22/2015 1826   COCAINSCRNUR NONE DETECTED 03/22/2015 1826   LABBENZ NONE DETECTED 03/22/2015 1826   AMPHETMU NONE DETECTED 03/22/2015 1826   THCU POSITIVE* 03/22/2015 1826   LABBARB NONE DETECTED 03/22/2015 1826    Alcohol Level:  Recent Labs  03/22/15 1557  ETH <5   Urinalysis:  Recent Labs  03/22/15 1842  COLORURINE AMBER*  LABSPEC 1.023  PHURINE 5.5  GLUCOSEU NEGATIVE  HGBUR MODERATE*  BILIRUBINUR SMALL*  KETONESUR NEGATIVE  PROTEINUR NEGATIVE  NITRITE NEGATIVE  LEUKOCYTESUR NEGATIVE    Micro Results: Recent Results (from the past 240 hour(s))  Urine culture     Status: None   Collection Time: 03/22/15  8:41 PM  Result Value Ref Range Status   Specimen Description URINE, CLEAN CATCH  Final   Special Requests Normal  Final   Culture NO GROWTH 1 DAY  Final   Report Status 03/23/2015 FINAL  Final   Studies/Results: Dg Chest 2 View  03/22/2015  CLINICAL DATA:  Shortness of breath and chest pain EXAM: CHEST  2 VIEW COMPARISON:  March 09, 2015 FINDINGS: There  is interstitial edema in the lower lung zones bilaterally. No airspace consolidation. There is a minimal left pleural effusion. There is cardiomegaly with mild pulmonary venous hypertension. No adenopathy. No bone lesions. IMPRESSION: Evidence a degree of congestive heart failure. No airspace consolidation. Electronically Signed   By: William  WBretta BangD.   On: 03/22/2015 16:46   Ct Angio Chest Pe W/cm &/or Wo Cm  03/23/2015  CLINICAL DATA:  Shortness of breath for 10 days with cough, initial encounter EXAM: CT ANGIOGRAPHY CHEST WITH CONTRAST TECHNIQUE: Multidetector CT imaging of the chest was performed using the standard protocol during bolus administration of intravenous contrast. Multiplanar CT image reconstructions and MIPs were obtained to evaluate the  vascular anatomy. CONTRAST:  OMNIPAQUE IOHEXOL 350 MG/ML SOLN COMPARISON:  None. FINDINGS: Lungs are well aerated bilaterally. Patchy bibasilar infiltrates are seen within the lower lobes and right middle lobe. Mild interstitial changes are noted likely related to a degree of interstitial edema. The thoracic inlet is within normal limits. Aortic calcifications are noted without aneurysmal dilatation. The pulmonary artery shows a normal branching pattern without filling defect to suggest pulmonary embolism. There are small hilar lymph nodes particularly on the right measuring 16 mm in short axis on image number 169 of series 5. Scattered small mediastinal lymph nodes are noted as well. These are likely reactive in nature. The visualized upper abdomen shows no acute abnormality. No osseous abnormality is seen. Review of the MIP images confirms the above findings. IMPRESSION: Diffuse interstitial edema with superimposed patchy pneumonic infiltrates in the bases bilaterally. No evidence of pulmonary emboli. Reactive lymphadenopathy particularly in the right hilum. Electronically Signed   By: Alcide Clever M.D.   On: 03/23/2015 12:03   Medications: I have reviewed the patient's current medications. Scheduled Meds: . antiseptic oral rinse  7 mL Mouth Rinse BID  . aspirin EC  81 mg Oral Daily  . carvedilol  25 mg Oral BID  . furosemide  60 mg Intravenous BID  . Influenza vac split quadrivalent PF  0.5 mL Intramuscular Tomorrow-1000  . ipratropium-albuterol  3 mL Nebulization Q6H  . losartan  100 mg Oral Daily  . pantoprazole  40 mg Oral Daily  . polyethylene glycol  17 g Oral Daily  . sodium chloride flush  3 mL Intravenous Q12H  . spironolactone  25 mg Oral Daily   Continuous Infusions:  PRN Meds:.acetaminophen **OR** acetaminophen, albuterol, sodium chloride flush Assessment/Plan: Principal Problem:   Acute exacerbation of CHF (congestive heart failure) (HCC) Active Problems:   Alcohol abuse    TOBACCO ABUSE   HYPERTENSION   Severe OSA   GERD (gastroesophageal reflux disease)   Elevated troponin   Hyperkalemia   Acute on chronic combined systolic and diastolic congestive heart failure (HCC)   Acute on chronic combined systolic and diastolic congestive heart failure (HCC)  60 year old man with nonischemic cardiomyopathy, CHF, HTN, GERD, OSA, ethanol abuse, and cocaine abuse presenting to the hospital with a 3 day history of cough, increased sputum production (brown colored) and wheezing.  Acute exacerbation of chronic combined systolic and diastolic CHF: Suspect he needs higher dose of Lasix at home. Echo from 05/2013 with ejection fraction 30-35%, diffuse hypokinesis, grade 1 diastolic dysfunction. Repeat echo 03/10/2015 with EF 40-45%, diffuse hypokinesis and grade 3 diastolic dysfunction. -IV Lasix 60 mg BID. -Spironolactone 25 mg daily  -Coreg 25 mg BID -Losartan 100 mg daily  -Daily weights -Strict I&Os  Acute bronchitis: Afebrile. WBC 13.9 but had been on a steroid  taper. Consolidation concerning for atelectasis. CT confirmed no PE. Bacterial pneumonia unlikely given negative pro-calcitonin. -Duoneb q6 hr  -Pulm toilet with IS  Microscopic hematuria: Hgb noted on UA. -UCx negative x 1 -Will need repeat UA at follow up.  CAD -Aspirin 81 mg daily   GERD -Protonix 40 mg daily   FEN: 2 gm sodium   VTE ppx: Lovenox   Code: FULL  Dispo: Awaiting improvement of acute medical problems  This is a Psychologist, occupational Note.  The care of the patient was discussed with Dr. Josem Kaufmann and the assessment and plan formulated with their assistance.  Please see their attached note for official documentation of the daily encounter.   LOS: 2 days   Veda Canning, Med Student 03/24/2015, 1:00 PM

## 2015-03-24 NOTE — Clinical Documentation Improvement (Signed)
Internal Medicine  Please clarify respiratory distress in progress notes and discharge summary   Acute respiratory failure  Acute on Chronic respiratory failure  Chronic respiratory failure  Other  Clinically Undetermined  Document any associated diagnoses/conditions.   Supporting Information:  60 yo male with chief complaint of Chest pain and shortness of breath History of smoking, sleep apnea, COPD and CHF O2 therapy via [place] 3-6L/m throughout this admission. Currently @ 3L/m  ED Provider note: presents today with increased dyspnea since discharge March 3 after admission for COPD exacerbation.. He has been getting gradually more short of breath. Today he felt extremely weak and had a near syncopal episode. He came in secondary to the severe weakness. He has had some dyspnea but denies any chest pain. He is improved here on oxygen.  ED Nurse Note: Pt reports to the ED for eval of CP, SOB, and generalized weakness. Pt reports he was just admitted for a CHF exacerbation and d/c on Friday. Pt was d/c with home O2 but they d/c it and placed him on an inhaler instead. O2 83% on RA. Pt placed on 2 L.   Rapid Response 3/14 Patient in respiratory distress, sitting on the side of the bed, purse lip breathing, diaphoretic, intermittently anxious.  BP 109/58 SR 99 RR 32 O2 sat 94% on 4l [place] Temp 98.8 Lung sounds expiratory wheezes, crackles Interventions: PRN albuterol treatment given New IV start Am lasix given early (40mg  IV) Resident at bedside. Patient still with increased RR (30s) but he states he feels better denies any chest pain. Lung sounds with rhonchi. Productive cough. BP 119/52 SR 85, RR 28-30s, O2 sat 97% on 4l Will continue to monitor.  3/14 progress note: Subjective: Episode of acute respiratory distress this morning. Received IV lasix and albuterol with improvement in his symptoms. Denies chest pain.  Resp:  28 32 22      Lungs: Rales at bilateral  bases. Wheezes throughout.   ABGs  Component     Latest Ref Rng 03/22/2015         5:37 PM  pH, Arterial     7.350 - 7.450 7.371  pCO2 arterial     35.0 - 45.0 mmHg 57.9 (HH)  pO2, Arterial     80.0 - 100.0 mmHg 68.0 (L)  Bicarbonate     20.0 - 24.0 mEq/L 33.6 (H)  TCO2     0 - 100 mmol/L 35  O2 Saturation      92.0  Acid-Base Excess     0.0 - 2.0 mmol/L 6.0 (H)  Patient temperature      98.6 F  Collection site      RADIAL, ALLEN'S TEST ACCEPTABLE  Sample type      ARTERIAL  Comment      NOTIFIED PHYSICIAN   Treatments CPAP at bedtime Incentive spiromety q1h while awake O2 therapy Keep O2 sats grater than 90% Pulse Ox check with VS     Please exercise your independent, professional judgment when responding. A specific answer is not anticipated or expected.   Thank You,  Harless Litten [employer] Information Management [place] [phone]

## 2015-03-24 NOTE — Progress Notes (Signed)
Protocol assessment complete pt requires Q6 nebs at this time.

## 2015-03-24 NOTE — Progress Notes (Signed)
Pt attempt to tolerate Cpap.  Unable to at this time.. RN aware

## 2015-03-24 NOTE — Progress Notes (Signed)
Pt c/o dizziness, and head feeling full.  Orthostatic VSS are negative.  Gave Lasix 60mg  IV this am, and pt has only voided 174 cc urine.  Pt is currently on 4L O2 and satting 94%.  Informed and spoke to Dr. Franchot Erichsen, and he is aware that pt has had a 9-beat V-tach run (asymptomatic).

## 2015-03-25 DIAGNOSIS — R894 Abnormal immunological findings in specimens from other organs, systems and tissues: Secondary | ICD-10-CM

## 2015-03-25 LAB — BASIC METABOLIC PANEL
ANION GAP: 10 (ref 5–15)
BUN: 22 mg/dL — ABNORMAL HIGH (ref 6–20)
CALCIUM: 9 mg/dL (ref 8.9–10.3)
CO2: 32 mmol/L (ref 22–32)
CREATININE: 1.08 mg/dL (ref 0.61–1.24)
Chloride: 92 mmol/L — ABNORMAL LOW (ref 101–111)
Glucose, Bld: 120 mg/dL — ABNORMAL HIGH (ref 65–99)
Potassium: 3.8 mmol/L (ref 3.5–5.1)
SODIUM: 134 mmol/L — AB (ref 135–145)

## 2015-03-25 LAB — CBC
HCT: 42.2 % (ref 39.0–52.0)
Hemoglobin: 13.9 g/dL (ref 13.0–17.0)
MCH: 30.2 pg (ref 26.0–34.0)
MCHC: 32.9 g/dL (ref 30.0–36.0)
MCV: 91.5 fL (ref 78.0–100.0)
PLATELETS: 420 10*3/uL — AB (ref 150–400)
RBC: 4.61 MIL/uL (ref 4.22–5.81)
RDW: 13.5 % (ref 11.5–15.5)
WBC: 11.6 10*3/uL — AB (ref 4.0–10.5)

## 2015-03-25 LAB — HIV ANTIBODY (ROUTINE TESTING W REFLEX): HIV SCREEN 4TH GENERATION: NONREACTIVE

## 2015-03-25 LAB — MAGNESIUM: MAGNESIUM: 2.3 mg/dL (ref 1.7–2.4)

## 2015-03-25 MED ORDER — FUROSEMIDE 10 MG/ML IJ SOLN
60.0000 mg | Freq: Once | INTRAMUSCULAR | Status: AC
  Administered 2015-03-25: 60 mg via INTRAVENOUS

## 2015-03-25 MED ORDER — FUROSEMIDE 40 MG PO TABS
40.0000 mg | ORAL_TABLET | Freq: Two times a day (BID) | ORAL | Status: DC
Start: 2015-03-25 — End: 2015-04-07

## 2015-03-25 MED ORDER — FUROSEMIDE 40 MG PO TABS
40.0000 mg | ORAL_TABLET | Freq: Two times a day (BID) | ORAL | Status: DC
  Administered 2015-03-25: 40 mg via ORAL
  Filled 2015-03-25: qty 1

## 2015-03-25 MED ORDER — FUROSEMIDE 40 MG PO TABS: 40.0000 mg | ORAL_TABLET | Freq: Two times a day (BID) | ORAL | Status: DC

## 2015-03-25 NOTE — Progress Notes (Signed)
Subjective: Mr. Goodlin did well overnight. He says his breathing is much improved and he feels better overall since his admission. Lasix held 1x yesterday evening because of concern for orthostatic hypotension. Patient complained of dizziness. Objective: Vital signs in last 24 hours: Filed Vitals:   03/24/15 1401 03/24/15 1959 03/24/15 2102 03/25/15 0353  BP:  109/86  109/76  Pulse:  94  47  Temp:  98.3 F (36.8 C)  97.8 F (36.6 C)  TempSrc:  Oral  Oral  Resp:  18  20  Height:      Weight:    108.5 kg (239 lb 3.2 oz)  SpO2: 90% 95% 99% 100%   Weight change: -0.045 kg (-1.6 oz)  Intake/Output Summary (Last 24 hours) at 03/25/15 1138 Last data filed at 03/25/15 1005  Gross per 24 hour  Intake   1295 ml  Output    325 ml  Net    970 ml   BP 109/76 mmHg  Pulse 47  Temp(Src) 97.8 F (36.6 C) (Oral)  Resp 20  Ht  (1.651 m)  Wt 108.5 kg (239 lb 3.2 oz)  BMI 39.80 kg/m2  SpO2 100% General Apperance: NAD HEENT: Normocephalic, atraumatic, anicteric sclera Neck: Supple, trachea midline, unable to evaluate JVD due to body habitus Lungs: Clear to auscultation bilaterally Heart: Regular rate and rhythm, no murmur/rub/gallop Abdomen: Soft, nontender, mildly distended, no rebound/guarding Extremities: Warm and well perfused, no edema Skin: No rashes or lesions Neurologic: Alert and interactive. No gross deficits.  Lab Results: Basic Metabolic Panel:  Recent Labs  69/62/95 0400 03/25/15 0440  NA 136 134*  K 4.0 3.8  CL 93* 92*  CO2 31 32  GLUCOSE 204* 120*  BUN 18 22*  CREATININE 1.09 1.08  CALCIUM 9.2 9.0  MG  --  2.3   Liver Function Tests:  Recent Labs  03/22/15 1557  AST 19  ALT 24  ALKPHOS 98  BILITOT 0.8  PROT 7.8  ALBUMIN 3.1*   No results for input(s): LIPASE, AMYLASE in the last 72 hours. No results for input(s): AMMONIA in the last 72 hours. CBC:  Recent Labs  03/22/15 1557 03/24/15 0400 03/25/15 0440  WBC 13.9* 11.3* 11.6*    NEUTROABS 11.3*  --   --   HGB 14.2 14.4 13.9  HCT 43.6 43.0 42.2  MCV 92.8 91.1 91.5  PLT 373 350 420*     Recent Labs  03/22/15 2239 03/23/15 0419  TROPONINI 0.14* 0.13*    Urine Drug Screen: Drugs of Abuse     Component Value Date/Time   LABOPIA NONE DETECTED 03/22/2015 1826   COCAINSCRNUR NONE DETECTED 03/22/2015 1826   LABBENZ NONE DETECTED 03/22/2015 1826   AMPHETMU NONE DETECTED 03/22/2015 1826   THCU POSITIVE* 03/22/2015 1826   LABBARB NONE DETECTED 03/22/2015 1826    Alcohol Level:  Recent Labs  03/22/15 1557  ETH <5   Urinalysis:  Recent Labs  03/22/15 1842  COLORURINE AMBER*  LABSPEC 1.023  PHURINE 5.5  GLUCOSEU NEGATIVE  HGBUR MODERATE*  BILIRUBINUR SMALL*  KETONESUR NEGATIVE  PROTEINUR NEGATIVE  NITRITE NEGATIVE  LEUKOCYTESUR NEGATIVE   Micro Results: Recent Results (from the past 240 hour(s))  Urine culture     Status: None   Collection Time: 03/22/15  8:41 PM  Result Value Ref Range Status   Specimen Description URINE, CLEAN CATCH  Final   Special Requests Normal  Final   Culture NO GROWTH 1 DAY  Final   Report Status 03/23/2015 FINAL  Final   Studies/Results: Ct Angio Chest Pe W/cm &/or Wo Cm  03/23/2015  CLINICAL DATA:  Shortness of breath for 10 days with cough, initial encounter EXAM: CT ANGIOGRAPHY CHEST WITH CONTRAST TECHNIQUE: Multidetector CT imaging of the chest was performed using the standard protocol during bolus administration of intravenous contrast. Multiplanar CT image reconstructions and MIPs were obtained to evaluate the vascular anatomy. CONTRAST:  OMNIPAQUE IOHEXOL 350 MG/ML SOLN COMPARISON:  None. FINDINGS: Lungs are well aerated bilaterally. Patchy bibasilar infiltrates are seen within the lower lobes and right middle lobe. Mild interstitial changes are noted likely related to a degree of interstitial edema. The thoracic inlet is within normal limits. Aortic calcifications are noted without aneurysmal  dilatation. The pulmonary artery shows a normal branching pattern without filling defect to suggest pulmonary embolism. There are small hilar lymph nodes particularly on the right measuring 16 mm in short axis on image number 169 of series 5. Scattered small mediastinal lymph nodes are noted as well. These are likely reactive in nature. The visualized upper abdomen shows no acute abnormality. No osseous abnormality is seen. Review of the MIP images confirms the above findings. IMPRESSION: Diffuse interstitial edema with superimposed patchy pneumonic infiltrates in the bases bilaterally. No evidence of pulmonary emboli. Reactive lymphadenopathy particularly in the right hilum. Electronically Signed   By: Alcide Clever M.D.   On: 03/23/2015 12:03   Medications: I have reviewed the patient's current medications. Scheduled Meds: . antiseptic oral rinse  7 mL Mouth Rinse BID  . aspirin EC  81 mg Oral Daily  . carvedilol  25 mg Oral BID  . furosemide  40 mg Oral BID  . ipratropium-albuterol  3 mL Nebulization BID  . losartan  100 mg Oral Daily  . pantoprazole  40 mg Oral Daily  . polyethylene glycol  17 g Oral Daily  . sodium chloride flush  3 mL Intravenous Q12H  . spironolactone  25 mg Oral Daily   Continuous Infusions:  PRN Meds:.acetaminophen **OR** acetaminophen, albuterol, sodium chloride flush Assessment/Plan: Principal Problem:   Acute exacerbation of CHF (congestive heart failure) (HCC) Active Problems:   Alcohol abuse   TOBACCO ABUSE   HYPERTENSION   Severe OSA   GERD (gastroesophageal reflux disease)   Elevated troponin   Hyperkalemia   Acute on chronic combined systolic and diastolic congestive heart failure (HCC)  60 year old man with nonischemic cardiomyopathy, CHF, HTN, GERD, OSA, ethanol abuse, and cocaine abuse presenting to the hospital with a 3 day history of cough, increased sputum production (brown colored) and wheezing.  Acute exacerbation of chronic combined systolic  and diastolic CHF: Suspect he needs higher dose of Lasix at home. Echo from 05/2013 with ejection fraction 30-35%, diffuse hypokinesis, grade 1 diastolic dysfunction. Repeat echo 03/10/2015 with EF 40-45%, diffuse hypokinesis and grade 3 diastolic dysfunction. Patient unlikely to be orthostatic given increase in weight by 3lbs overnight and he is ~1200 ml positive on i/o's. Dizziness possibly 2/2 to hyperventilation on cpap. -IV Lasix 60 mg 1x this AM and then switch to po 40 BID lasix -Spironolactone 25 mg daily  -Coreg 25 mg BID -Losartan 100 mg daily  -Daily weights -Strict I&Os  Acute bronchitis: Afebrile. WBC 13.9 but had been on a steroid taper. Consolidation concerning for atelectasis. CT confirmed no PE. Bacterial pneumonia unlikely given negative pro-calcitonin. -Duoneb q6 hr  -Pulm toilet with IS  Microscopic hematuria: Hgb noted on UA. -UCx negative x 1 -Will need repeat UA at follow up.  CAD -Aspirin  81 mg daily   GERD -Protonix 40 mg daily   FEN: 2 gm sodium   VTE ppx: Lovenox   Code: FULL  Dispo: Awaiting improvement of acute medical problems   This is a Psychologist, occupational Note.  The care of the patient was discussed with Dr. Isabella Bowens and the assessment and plan formulated with their assistance.  Please see their attached note for official documentation of the daily encounter.   LOS: 3 days   Veda Canning, Med Student 03/25/2015, 11:38 AM

## 2015-03-25 NOTE — Progress Notes (Signed)
Orders received for pt discharge.  Discharge summary printed and reviewed with pt.  Explained medication regimen, and pt had no further questions at this time.  IV removed and site remains clean, dry, intact.  Telemetry removed.  Pt in stable condition and awaiting transport. 

## 2015-03-25 NOTE — Progress Notes (Signed)
Subjective: Dyspnea greatly improved and he feels that he has returned to baseline. Denies chest pain. He had an episode of dizziness yesterday that he attributes to hyperventilation with the breathing treatments. Resolved by last evening. No recurrence.  Objective: Vital signs in last 24 hours: Filed Vitals:   03/24/15 1401 03/24/15 1959 03/24/15 2102 03/25/15 0353  BP:  109/86  109/76  Pulse:  94  47  Temp:  98.3 F (36.8 C)  97.8 F (36.6 C)  TempSrc:  Oral  Oral  Resp:  18  20  Height:      Weight:    239 lb 3.2 oz (108.5 kg)  SpO2: 90% 95% 99% 100%   Weight change: -1.6 oz (-0.045 kg)  Intake/Output Summary (Last 24 hours) at 03/25/15 1116 Last data filed at 03/25/15 1005  Gross per 24 hour  Intake   1295 ml  Output    325 ml  Net    970 ml   General Apperance: NAD HEENT: Normocephalic, atraumatic, anicteric sclera Neck: Supple, trachea midline, unable to evaluate JVD due to body habitus Lungs: CTAB  Heart: Regular rate and rhythm, no murmur/rub/gallop Abdomen: Soft, nontender, mildly distended, no rebound/guarding Extremities: Warm and well perfused, no edema Skin: No rashes or lesions Neurologic: Alert and interactive. No gross deficits.  Lab Results: Basic Metabolic Panel:  Recent Labs Lab 03/24/15 0400 03/25/15 0440  NA 136 134*  K 4.0 3.8  CL 93* 92*  CO2 31 32  GLUCOSE 204* 120*  BUN 18 22*  CREATININE 1.09 1.08  CALCIUM 9.2 9.0  MG  --  2.3   CBC:  Recent Labs Lab 03/22/15 1557 03/24/15 0400 03/25/15 0440  WBC 13.9* 11.3* 11.6*  NEUTROABS 11.3*  --   --   HGB 14.2 14.4 13.9  HCT 43.6 43.0 42.2  MCV 92.8 91.1 91.5  PLT 373 350 420*   Cardiac Enzymes:  Recent Labs Lab 03/22/15 2239 03/23/15 0419  TROPONINI 0.14* 0.13*   BNP:    Component Value Date/Time   BNP 458.5* 03/22/2015 2239   Urine Drug Screen: Drugs of Abuse     Component Value Date/Time   LABOPIA NONE DETECTED 03/22/2015 1826   COCAINSCRNUR NONE DETECTED  03/22/2015 1826   LABBENZ NONE DETECTED 03/22/2015 1826   AMPHETMU NONE DETECTED 03/22/2015 1826   THCU POSITIVE* 03/22/2015 1826   LABBARB NONE DETECTED 03/22/2015 1826    Medications: I have reviewed the patient's current medications. Scheduled Meds: . antiseptic oral rinse  7 mL Mouth Rinse BID  . aspirin EC  81 mg Oral Daily  . carvedilol  25 mg Oral BID  . ipratropium-albuterol  3 mL Nebulization BID  . losartan  100 mg Oral Daily  . pantoprazole  40 mg Oral Daily  . polyethylene glycol  17 g Oral Daily  . sodium chloride flush  3 mL Intravenous Q12H  . spironolactone  25 mg Oral Daily   Continuous Infusions:  PRN Meds:.acetaminophen **OR** acetaminophen, albuterol, sodium chloride flush   Assessment/Plan: 60 year old man with nonischemic cardiomyopathy, CHF, HTN, GERD, OSA, ethanol abuse, and cocaine abuse presenting to the hospital with a 3 day history of cough, increased sputum production (brown colored) and wheezing.  Acute exacerbation of chronic combined systolic and diastolic CHF: Suspect he needs higher dose of Lasix at home. Echo from 05/2013 with ejection fraction 30-35%, diffuse hypokinesis, grade 1 diastolic dysfunction. Repeat echo 03/10/2015 with EF 40-45%, diffuse hypokinesis and grade 3 diastolic dysfunction. -IV Lasix 60 mg this  morning. Transition to Lasix PO  BID this afternoon. -Spironolactone 25 mg daily  -Coreg 25 mg BID -Losartan 100 mg daily  -Daily weights -Strict I&Os   Atelectasis: Afebrile. WBC 13.9 but had been on a steroid taper. Consolidation concerning for atelectasis vs pneumonia on CTA chest. No PE. Procalcitonin neg. Likely atelectasis. -Duoneb q6 hr  -Pulm toilet with IS  Microscopic hematuria: Hgb noted on UA. UCx with no growth. -Will need repeat UA at follow up.  CAD -Aspirin 81 mg daily   GERD -Protonix 40 mg daily   FEN: 2 gm sodium   VTE ppx: Lovenox   Code: FULL  Dispo: Likely home today.  The patient does have  a current PCP [name], MD) and does need an [facility] hospital follow-up appointment after discharge.  The patient does not know have transportation limitations that hinder transportation to clinic appointments.  .Services Needed at time of discharge: Y = Yes, Blank = No PT:   OT:   RN:   Equipment:   Other:     LOS: 3 days   Lora Paula, MD 03/25/2015, 11:16 AM

## 2015-03-25 NOTE — Discharge Instructions (Signed)
Change how you take your Lasix: Take 1 tablet (40mg ) in the morning and 1 tablet (40mg ) in the afternoon.   Heart Failure Heart failure means your heart has trouble pumping blood. This makes it hard for your body to work well. Heart failure is usually a long-term (chronic) condition. You must take good care of yourself and follow your doctor's treatment plan. HOME CARE  Take your heart medicine as told by your doctor.  Do not stop taking medicine unless your doctor tells you to.  Do not skip any dose of medicine.  Refill your medicines before they run out.  Take other medicines only as told by your doctor or pharmacist.  Stay active if told by your doctor. The elderly and people with severe heart failure should talk with a doctor about physical activity.  Eat heart-healthy foods. Choose foods that are without trans fat and are low in saturated fat, cholesterol, and salt (sodium). This includes fresh or frozen fruits and vegetables, fish, lean meats, fat-free or low-fat dairy foods, whole grains, and high-fiber foods. Lentils and dried peas and beans (legumes) are also good choices.  Limit salt if told by your doctor.  Cook in a healthy way. Roast, grill, broil, bake, poach, steam, or stir-fry foods.  Limit fluids as told by your doctor.  Weigh yourself every morning. Do this after you pee (urinate) and before you eat breakfast. Write down your weight to give to your doctor.  Take your blood pressure and write it down if your doctor tells you to.  Ask your doctor how to check your pulse. Check your pulse as told.  Lose weight if told by your doctor.  Stop smoking or chewing tobacco. Do not use gum or patches that help you quit without your doctor's approval.  Schedule and go to doctor visits as told.  Nonpregnant women should have no more than 1 drink a day. Men should have no more than 2 drinks a day. Talk to your doctor about drinking alcohol.  Stop illegal drug use.  Stay  current with shots (immunizations).  Manage your health conditions as told by your doctor.  Learn to manage your stress.  Rest when you are tired.  If it is really hot outside:  Avoid intense activities.  Use air conditioning or fans, or get in a cooler place.  Avoid caffeine and alcohol.  Wear loose-fitting, lightweight, and light-colored clothing.  If it is really cold outside:  Avoid intense activities.  Layer your clothing.  Wear mittens or gloves, a hat, and a scarf when going outside.  Avoid alcohol.  Learn about heart failure and get support as needed.  Get help to maintain or improve your quality of life and your ability to care for yourself as needed. GET HELP IF:   You gain weight quickly.  You are more short of breath than usual.  You cannot do your normal activities.  You tire easily.  You cough more than normal, especially with activity.  You have any or more puffiness (swelling) in areas such as your hands, feet, ankles, or belly (abdomen).  You cannot sleep because it is hard to breathe.  You feel like your heart is beating fast (palpitations).  You get dizzy or light-headed when you stand up. GET HELP RIGHT AWAY IF:   You have trouble breathing.  There is a change in mental status, such as becoming less alert or not being able to focus.  You have chest pain or discomfort.  You  faint. MAKE SURE YOU:   Understand these instructions.  Will watch your condition.  Will get help right away if you are not doing well or get worse.   This information is not intended to replace advice given to you by your health care provider. Make sure you discuss any questions you have with your health care provider.   Document Released: 10/05/2007 Document Revised: 01/16/2014 Document Reviewed: 02/12/2012 Elsevier Interactive Patient Education [employer].

## 2015-03-25 NOTE — Progress Notes (Signed)
SATURATION QUALIFICATIONS: (This note is used to comply with regulatory documentation for home oxygen)  Patient Saturations on Room Air at Rest = 95%  Patient Saturations on Room Air while Ambulating = 93%  Patient Saturations on  Liters of oxygen while Ambulating = %  Please briefly explain why patient needs home oxygen: Pt does not qualify for home O2 as his oxygen saturation was at 93% ambulating on room air.

## 2015-03-26 ENCOUNTER — Telehealth: Payer: Self-pay | Admitting: Pulmonary Disease

## 2015-03-26 LAB — HEPATITIS C ANTIBODY (REFLEX): HCV Ab: >11 s/co ratio — ABNORMAL HIGH (ref 0.0–0.9)

## 2015-03-26 LAB — COMMENT2 - HEP PANEL

## 2015-03-26 NOTE — Discharge Summary (Signed)
Name: Ethan Wilcox MRN: [id number] DOB: 1955-05-12 60 y.o. PCP: Burgess Estelle, MD  Date of Admission: 03/22/2015  4:08 PM Date of Discharge: 03/25/2015 Attending Physician: Murriel Hopper, MD  Discharge Diagnosis: Principal Problem:   Acute on chronic combined systolic and diastolic congestive heart failure (HCC) Active Problems:   Hepatitis C antibody positive   Alcohol abuse   TOBACCO ABUSE   HYPERTENSION   Severe OSA   GERD (gastroesophageal reflux disease)  Discharge Medications:   Medication List    TAKE these medications        albuterol 108 (90 Base) MCG/ACT inhaler  Commonly known as:  PROVENTIL HFA;VENTOLIN HFA  Inhale 2 puffs into the lungs every 4 (four) hours as needed for wheezing or shortness of breath.     ASPIR-LOW 81 MG EC tablet  Generic drug:  aspirin  Take 81 mg by mouth daily.     carvedilol 25 MG tablet  Commonly known as:  COREG  Take 1 tablet (25 mg total) by mouth 2 (two) times daily.     diphenhydrAMINE 25 MG tablet  Commonly known as:  BENADRYL  Take 25 mg by mouth every 6 (six) hours as needed for allergies.     EPINEPHrine 0.3 mg/0.3 mL Soaj injection  Commonly known as:  EPIPEN  Inject 0.3 mL (0.3 mg total) into the muscle once as needed (anaphylaxis).     furosemide 40 MG tablet  Commonly known as:  LASIX  Take 1 tablet (40 mg total) by mouth 2 (two) times daily.     ibuprofen 600 MG tablet  Commonly known as:  ADVIL,MOTRIN  Take 1 tablet (600 mg total) by mouth every 6 (six) hours as needed.     losartan 100 MG tablet  Commonly known as:  COZAAR  Take 1 tablet (100 mg total) by mouth daily.     multivitamin with minerals Tabs tablet  Take 1 tablet by mouth daily.     omeprazole 40 MG capsule  Commonly known as:  PRILOSEC  Take 1 capsule (40 mg total) by mouth daily.     polyethylene glycol powder powder  Commonly known as:  GLYCOLAX/MIRALAX  Take 17 g by mouth daily. DISSOLVE 1 CAPFUL BY MOUTH DAILY AS DIRECTED       spironolactone 25 MG tablet  Commonly known as:  ALDACTONE  Take 1 tablet (25 mg total) by mouth daily.        Disposition and follow-up:   Ethan Wilcox was discharged from [facility] in Stable condition.  At the hospital follow up visit please address:  1.  HCV Ab +: Needs further testing with HCV RNA testing, referral to [facility]  Acute on chronic systolic and diastolic heart failure: Discharged on Lasix 40 mg PO BID. Weight prior to discharge was around 237-239. Please titrate Lasix as needed. Mildly elevated trop: Consider an outpatient pharmacologic stress test to look for underlying ischemia.  Microscopic hematuria: No growth on urine culture. Needs repeat UA with microscopy in 1 month. See suggested algorithm below.  2.  Labs / imaging needed at time of follow-up: UA in one month, HCV RNA, BMP  3.  Pending labs/ test needing follow-up: None  Follow-up Appointments:     Follow-up Information    Follow up with Sherin Quarry, MD. Go on 03/31/2015.   Specialty:  Internal Medicine   Why:  Hospital followup at 3:15pm    Contact information:   [place]  [place] [id number] [phone]       Discharge Instructions: Discharge Instructions    (HEART FAILURE PATIENTS) Call MD:  Anytime you have any of the following symptoms: 1) 3 pound weight gain in 24 hours or 5 pounds in 1 week 2) shortness of breath, with or without a dry hacking cough 3) swelling in the hands, feet or stomach 4) if you have to sleep on extra pillows at night in order to breathe.    Complete by:  As directed      Call MD for:  difficulty breathing, headache or visual disturbances    Complete by:  As directed      Call MD for:  persistant dizziness or light-headedness    Complete by:  As directed      Call MD for:  persistant nausea and vomiting    Complete by:  As directed      Call MD for:  severe uncontrolled pain    Complete by:  As directed      Call MD for:   temperature >100.4    Complete by:  As directed      Diet - low sodium heart healthy    Complete by:  As directed      Increase activity slowly    Complete by:  As directed            Consultations: None  Procedures Performed:  Dg Chest 2 View  03/22/2015  CLINICAL DATA:  Shortness of breath and chest pain EXAM: CHEST  2 VIEW COMPARISON:  March 09, 2015 FINDINGS: There is interstitial edema in the lower lung zones bilaterally. No airspace consolidation. There is a minimal left pleural effusion. There is cardiomegaly with mild pulmonary venous hypertension. No adenopathy. No bone lesions. IMPRESSION: Evidence a degree of congestive heart failure. No airspace consolidation. Electronically Signed   By: Lowella Grip III M.D.   On: 03/22/2015 16:46   Dg Chest 2 View  03/09/2015  CLINICAL DATA:  Pt is having SOB, chest pain and slight dizziness x 3 days.Hx of CHF, HTN, nonischemic cardiomyopathy, ETOH abuse, smoker, obesity sleep apnea EXAM: CHEST  2 VIEW COMPARISON:  03/08/2015 FINDINGS: Mild cardiomegaly. No mediastinal or hilar masses or evidence of adenopathy. Diffuse peribronchial thickening without change from the prior study. No lung consolidation or overt edema. No pleural effusion or pneumothorax. Skeletal structures are intact. IMPRESSION: 1. No acute cardiopulmonary disease. No change from the previous day's study. Electronically Signed   By: Lajean Manes M.D.   On: 03/09/2015 18:14   Dg Chest 2 View  03/08/2015  CLINICAL DATA:  Pt states he was tx here for a cough on 2/24. He states today he feels very weak. He also just started taking tussinex syrup today and is feeling relief from his cough with the medicine. Pt states dyspnea on exertion, hx: CHF, cardiomyopathy EXAM: CHEST  2 VIEW COMPARISON:  03/05/2015 FINDINGS: Midline trachea. Mild cardiomegaly. Mediastinal contours otherwise within normal limits. No pleural effusion or pneumothorax. Low lung volumes with resultant  pulmonary interstitial prominence. No congestive failure. Diffuse peribronchial thickening. No lobar consolidation. IMPRESSION: Mild cardiomegaly and low lung volumes. No overt congestive failure or acute disease. Peribronchial thickening which may relate to chronic bronchitis or smoking. Electronically Signed   By: Abigail Miyamoto M.D.   On: 03/08/2015 19:41   Dg Chest 2 View  03/06/2015  CLINICAL DATA:  Acute onset of cough and congestion. Initial encounter. EXAM: CHEST  2 VIEW COMPARISON:  Chest radiograph  from 07/21/2014 FINDINGS: The lungs are well-aerated. Peribronchial thickening is noted. Mildly increased interstitial markings may reflect mild interstitial edema. There is no evidence of pleural effusion or pneumothorax. The heart is borderline enlarged. No acute osseous abnormalities are seen. IMPRESSION: Borderline cardiomegaly. Peribronchial thickening noted. Mildly increased interstitial markings may reflect mild interstitial edema. Electronically Signed   By: Garald Balding M.D.   On: 03/06/2015 00:51   Ct Angio Chest Pe W/cm &/or Wo Cm  03/23/2015  CLINICAL DATA:  Shortness of breath for 10 days with cough, initial encounter EXAM: CT ANGIOGRAPHY CHEST WITH CONTRAST TECHNIQUE: Multidetector CT imaging of the chest was performed using the standard protocol during bolus administration of intravenous contrast. Multiplanar CT image reconstructions and MIPs were obtained to evaluate the vascular anatomy. CONTRAST:  141m OMNIPAQUE IOHEXOL 350 MG/ML SOLN COMPARISON:  None. FINDINGS: Lungs are well aerated bilaterally. Patchy bibasilar infiltrates are seen within the lower lobes and right middle lobe. Mild interstitial changes are noted likely related to a degree of interstitial edema. The thoracic inlet is within normal limits. Aortic calcifications are noted without aneurysmal dilatation. The pulmonary artery shows a normal branching pattern without filling defect to suggest pulmonary embolism. There are  small hilar lymph nodes particularly on the right measuring 16 mm in short axis on image number 169 of series 5. Scattered small mediastinal lymph nodes are noted as well. These are likely reactive in nature. The visualized upper abdomen shows no acute abnormality. No osseous abnormality is seen. Review of the MIP images confirms the above findings. IMPRESSION: Diffuse interstitial edema with superimposed patchy pneumonic infiltrates in the bases bilaterally. No evidence of pulmonary emboli. Reactive lymphadenopathy particularly in the right hilum. Electronically Signed   By: MInez CatalinaM.D.   On: 03/23/2015 12:03    Admission HPI: 60year old man with a history of non-ischemic cardiomyopathy and combined diastolic and systolic CHF with an EF of 40-45% thought to be secondary to cocaine and ETOH abuse, HTN, OSA, Obesity presents today with complaints of generalized weakness and shortness of breath. Patient was recently admitted from 2/28 to 3/3 with acute exacerbation of CHF and viral bronchitis. He was aggressively diuresed with improvement in his symptoms at that time. Generalized weakness and shortness of breath returned soon after discharge and have progressively worsened. Reports compliance with his home medications. Does report decreased urine output over the past week. Reports his weakness became acutely worse today and felt extremely weak and lightheaded. Never fell or lost consciousness. States he can only walk a few feet before become weak and dyspneic. Denies any leg edema. Also reports having visual disturbances that began today. Describes color changes when looking at object. No loss of vision, blurry vision or black spots. Denies any chest pain, palpitations, abdominal pain, nausea, vomiting, dysuria, fevers or chills. Does report continued cough with yellow sputum production.    Hospital Course by problem list:   1. Acute on chronic systolic and diastolic heart failure: On admission,  orthostatics were negative. K was 5.8, no EKG changes. BNP 458 on this admission, improved from last admission of 685. He was admitted to telemetry. He was initiated on IV diuresis. Due to Wells score indicating intermediate risk for PE, CT PE protocol was obtained. No evidence of PE. Noted to have right middle lobe consolidation consistent with atelectasis as procalcitonin negative. Patient given incentive spirometer. Patient was transitioned to Lasix 40 mg PO BID. Weight prior to discharge was around 237-239. He reported improvement of symptoms and return  to baseline.   2. Mildly elevated troponin: Likely related to his acute on chronic systolic and diastolic heart failure. Trended down during admission.  3. Microscopic hematuria: No growth on urine culture. Will need a repeat urinalysis in approximately one month to assess for persistent microscopic hematuria. If there is hematuria, he will need to undergo further workup including a CT urogram. If the repeat urinalysis is negative he will require one more additional urinalysis in one month to ensure that he has 2 of 3 negative urinalyses for microscopic hematuria.  4. Hepatitis C antibody positive: Will need further workup as outpatient.  Discharge Vitals:   BP 100/69 mmHg  Pulse 93  Temp(Src) 98.2 F (36.8 C) (Oral)  Resp 18  Ht 5' 5"  (1.651 m)  Wt 239 lb 3.2 oz (108.5 kg)  BMI 39.80 kg/m2  SpO2 90%  Discharge Labs:  Results for orders placed or performed during the hospital encounter of 03/22/15 (from the past 72 hour(s))  Basic metabolic panel     Status: Abnormal   Collection Time: 03/24/15  4:00 AM  Result Value Ref Range   Sodium 136 135 - 145 mmol/L   Potassium 4.0 3.5 - 5.1 mmol/L   Chloride 93 (L) 101 - 111 mmol/L   CO2 31 22 - 32 mmol/L   Glucose, Bld 204 (H) 65 - 99 mg/dL   BUN 18 6 - 20 mg/dL   Creatinine, Ser 1.09 0.61 - 1.24 mg/dL   Calcium 9.2 8.9 - 10.3 mg/dL   GFR calc non Af Amer >60 >60 mL/min   GFR calc Af Amer  >60 >60 mL/min    Comment: (NOTE) The eGFR has been calculated using the CKD EPI equation. This calculation has not been validated in all clinical situations. eGFR's persistently <60 mL/min signify possible Chronic Kidney Disease.    Anion gap 12 5 - 15  CBC     Status: Abnormal   Collection Time: 03/24/15  4:00 AM  Result Value Ref Range   WBC 11.3 (H) 4.0 - 10.5 K/uL   RBC 4.72 4.22 - 5.81 MIL/uL   Hemoglobin 14.4 13.0 - 17.0 g/dL   HCT 43.0 39.0 - 52.0 %   MCV 91.1 78.0 - 100.0 fL   MCH 30.5 26.0 - 34.0 pg   MCHC 33.5 30.0 - 36.0 g/dL   RDW 13.3 11.5 - 15.5 %   Platelets 350 150 - 400 K/uL  Basic metabolic panel     Status: Abnormal   Collection Time: 03/25/15  4:40 AM  Result Value Ref Range   Sodium 134 (L) 135 - 145 mmol/L   Potassium 3.8 3.5 - 5.1 mmol/L   Chloride 92 (L) 101 - 111 mmol/L   CO2 32 22 - 32 mmol/L   Glucose, Bld 120 (H) 65 - 99 mg/dL   BUN 22 (H) 6 - 20 mg/dL   Creatinine, Ser 1.08 0.61 - 1.24 mg/dL   Calcium 9.0 8.9 - 10.3 mg/dL   GFR calc non Af Amer >60 >60 mL/min   GFR calc Af Amer >60 >60 mL/min    Comment: (NOTE) The eGFR has been calculated using the CKD EPI equation. This calculation has not been validated in all clinical situations. eGFR's persistently <60 mL/min signify possible Chronic Kidney Disease.    Anion gap 10 5 - 15  Magnesium     Status: None   Collection Time: 03/25/15  4:40 AM  Result Value Ref Range   Magnesium 2.3 1.7 - 2.4 mg/dL  CBC     Status: Abnormal   Collection Time: 03/25/15  4:40 AM  Result Value Ref Range   WBC 11.6 (H) 4.0 - 10.5 K/uL   RBC 4.61 4.22 - 5.81 MIL/uL   Hemoglobin 13.9 13.0 - 17.0 g/dL   HCT 42.2 39.0 - 52.0 %   MCV 91.5 78.0 - 100.0 fL   MCH 30.2 26.0 - 34.0 pg   MCHC 32.9 30.0 - 36.0 g/dL   RDW 13.5 11.5 - 15.5 %   Platelets 420 (H) 150 - 400 K/uL  HIV antibody     Status: None   Collection Time: 03/25/15  4:40 AM  Result Value Ref Range   HIV Screen 4th Generation wRfx Non Reactive Non  Reactive    Comment: (NOTE) Performed At: [facility] [place], [place] [id number] Lindon Romp MD MV:[id number]   Hepatitis c antibody (reflex)     Status: Abnormal   Collection Time: 03/25/15  4:40 AM  Result Value Ref Range   HCV Ab >11.0 (H) 0.0 - 0.9 s/co ratio    Comment: (NOTE) Performed At: [facility] [place], [place] [id number] Lindon Romp MD UU:[id number]   Comment2 - Hep panel     Status: None   Collection Time: 03/25/15  4:40 AM  Result Value Ref Range   COMMENT HCV-2 Comment     Comment: (NOTE) Strong reactive antibody screen (s/c ratio >10.9) is consistent with past or present HCV infection.  Follow-up testing by HCV, Quantitative, Real time PCR (#[id number]) is recommended to determine viral load/diagnosis of current HCV infection. Performed At: [facility] [address] [place], [place] [id number] Lindon Romp MD OV:[id number]     Signed: Milagros Loll, MD 03/26/2015, 2:43 PM    Services Ordered on Discharge: None Equipment Ordered on Discharge: None

## 2015-03-26 NOTE — Telephone Encounter (Addendum)
  Reason for call:   I placed an outgoing call to [name] at 2 PM regarding positive HCV antibody.   Assessment/ Plan:   He asked me to call back at 3:30PM. I called back at 3:30, and someone in his household reported that he was not available.  Called back at 4:13PM. He confirmed with me his date of birth and said this was a good time to talk. I told him that his HCV ab was positive but we will need check his viral load. Advised him to keep his clinic appointment. Also advised him to minimize risk transmitting the virus (safe sex with condoms and no needlesharing).   Lora Paula, MD   03/26/2015, 3:33 PM

## 2015-03-27 ENCOUNTER — Other Ambulatory Visit: Payer: Self-pay | Admitting: Internal Medicine

## 2015-03-29 NOTE — Telephone Encounter (Signed)
Pt needs to discuss use of ibuprofen at the hospital follow up visit  His creatinine was elevated last weeks so I am not refilling this medication for now.  Thanks

## 2015-03-30 ENCOUNTER — Telehealth: Payer: Self-pay | Admitting: Internal Medicine

## 2015-03-30 NOTE — Telephone Encounter (Signed)
APPT. REMINDER CALL, NO ANSWER, NO VOICE MAIL °

## 2015-03-31 ENCOUNTER — Ambulatory Visit (INDEPENDENT_AMBULATORY_CARE_PROVIDER_SITE_OTHER): Payer: Medicaid Other | Admitting: Internal Medicine

## 2015-03-31 ENCOUNTER — Encounter: Payer: Self-pay | Admitting: Internal Medicine

## 2015-03-31 VITALS — BP 158/88 | HR 110 | Temp 97.9°F | Ht 67.0 in | Wt 244.2 lb

## 2015-03-31 DIAGNOSIS — R3129 Other microscopic hematuria: Secondary | ICD-10-CM | POA: Diagnosis not present

## 2015-03-31 DIAGNOSIS — R768 Other specified abnormal immunological findings in serum: Secondary | ICD-10-CM | POA: Insufficient documentation

## 2015-03-31 DIAGNOSIS — R894 Abnormal immunological findings in specimens from other organs, systems and tissues: Secondary | ICD-10-CM

## 2015-03-31 DIAGNOSIS — I5043 Acute on chronic combined systolic (congestive) and diastolic (congestive) heart failure: Secondary | ICD-10-CM | POA: Diagnosis present

## 2015-03-31 NOTE — Assessment & Plan Note (Signed)
A/P: HFpEF.  Patient has regained weight since discharge and remains fatigued with DOE. He has only been taking Lasix 60 mg once daily and Spironolactone.  Will change patient back to Lasix 40 mg PO BID for the next week in order to get further volume off and reassess symptoms.  He did have elevated troponin while admitted that remained stable.  It is unclear whether patient would benefit from ICM workup given lack of angina and that his heart failure is diagnostic in nature, but will continue to consider the need in the future. - Lasix 40 mg PO BID - Spironolactone 25 mg daily - Continue Coreg and Losartan - RTC 1 week

## 2015-03-31 NOTE — Assessment & Plan Note (Addendum)
A/P: HCV Ab positive.  Will check viral load and refer to ID as necessary - HCV viral load  Addendum: HCV viral load elevated to 100,000.  Will refer to ID for genotyping and treatment. - ID referral

## 2015-03-31 NOTE — Assessment & Plan Note (Signed)
Patient had microscopic hematuria while admitted.  Repeat U/A recommended 1 month from discharge. - U/A in April 2017.

## 2015-03-31 NOTE — Patient Instructions (Signed)
1. Take Lasix 40 mg (1 tab) twice a day for the next week. 2. Continue taking Spironolactone 25 mg. 3. Follow up with Cardiac rehab.   Heart Failure Heart failure means your heart has trouble pumping blood. This makes it hard for your body to work well. Heart failure is usually a long-term (chronic) condition. You must take good care of yourself and follow your doctor's treatment plan. HOME CARE  Take your heart medicine as told by your doctor.  Do not stop taking medicine unless your doctor tells you to.  Do not skip any dose of medicine.  Refill your medicines before they run out.  Take other medicines only as told by your doctor or pharmacist.  Stay active if told by your doctor. The elderly and people with severe heart failure should talk with a doctor about physical activity.  Eat heart-healthy foods. Choose foods that are without trans fat and are low in saturated fat, cholesterol, and salt (sodium). This includes fresh or frozen fruits and vegetables, fish, lean meats, fat-free or low-fat dairy foods, whole grains, and high-fiber foods. Lentils and dried peas and beans (legumes) are also good choices.  Limit salt if told by your doctor.  Cook in a healthy way. Roast, grill, broil, bake, poach, steam, or stir-fry foods.  Limit fluids as told by your doctor.  Weigh yourself every morning. Do this after you pee (urinate) and before you eat breakfast. Write down your weight to give to your doctor.  Take your blood pressure and write it down if your doctor tells you to.  Ask your doctor how to check your pulse. Check your pulse as told.  Lose weight if told by your doctor.  Stop smoking or chewing tobacco. Do not use gum or patches that help you quit without your doctor's approval.  Schedule and go to doctor visits as told.  Nonpregnant women should have no more than 1 drink a day. Men should have no more than 2 drinks a day. Talk to your doctor about drinking  alcohol.  Stop illegal drug use.  Stay current with shots (immunizations).  Manage your health conditions as told by your doctor.  Learn to manage your stress.  Rest when you are tired.  If it is really hot outside:  Avoid intense activities.  Use air conditioning or fans, or get in a cooler place.  Avoid caffeine and alcohol.  Wear loose-fitting, lightweight, and light-colored clothing.  If it is really cold outside:  Avoid intense activities.  Layer your clothing.  Wear mittens or gloves, a hat, and a scarf when going outside.  Avoid alcohol.  Learn about heart failure and get support as needed.  Get help to maintain or improve your quality of life and your ability to care for yourself as needed. GET HELP IF:   You gain weight quickly.  You are more short of breath than usual.  You cannot do your normal activities.  You tire easily.  You cough more than normal, especially with activity.  You have any or more puffiness (swelling) in areas such as your hands, feet, ankles, or belly (abdomen).  You cannot sleep because it is hard to breathe.  You feel like your heart is beating fast (palpitations).  You get dizzy or light-headed when you stand up. GET HELP RIGHT AWAY IF:   You have trouble breathing.  There is a change in mental status, such as becoming less alert or not being able to focus.  You have  chest pain or discomfort.  You faint. MAKE SURE YOU:   Understand these instructions.  Will watch your condition.  Will get help right away if you are not doing well or get worse.   This information is not intended to replace advice given to you by your health care provider. Make sure you discuss any questions you have with your health care provider.   Document Released: 10/05/2007 Document Revised: 01/16/2014 Document Reviewed: 02/12/2012 Elsevier Interactive Patient Education [employer]

## 2015-03-31 NOTE — Progress Notes (Signed)
Patient ID: Ethan Wilcox, male   DOB: July 30, 1955, 60 y.o.   MRN: [id number]   Subjective:   Patient ID: Ethan Wilcox male   DOB: February 27, 1955 60 y.o.   MRN: [id number]  HPI: [name] is a 60 y.o. male with PMH as below, here for hospital f/u of HCV and CHF.  Please see Problem-Based charting for the status of the patient's chronic medical issues.  Patient was discharged from [facility] on 3/16 for CHF exacerbation 2/2 presumed viral bronchitis.  He was diuresed to dry weight 237-239 with improvement in dyspnea.  Since discharge, patient has had continued DOE and cough productive of yellow sputum.  His cough is overall improving and relieved with cough drops.  He was discharged on Lasix 40 mg PO BID and Spironolactone.  However, he has been taking Lasix 60 mg once daily and Spironolactone.  He denies leg swelling, orthopnea, or angina.  He has not been eating fast food, but still applies high salt seasoning to his food.  He drinks 3-4 bottles of water per day. He does not have a scale at home.  He was also found to be HCV Ab positive while admitted.  He denies abdominal pain, jaundice, acholic stools, hematemesis, N/V, C/D, or dysuria.   Past Medical History  Diagnosis Date  . Nonischemic cardiomyopathy (HCC)     admitted 5/11 w acute systolic CHF. LHC showed no angiographic CAD. Echo (5/11) showed EF 15-20% w diffuse sever hypokinesis, moderate MR> RHC showed PCWP 10, CI 1.47 (Fick.) cause of CMP thought to be heavy ETOH and cocaine. No ICD yet due to ongoing ETOH intake  . ETOH abuse   . Cocaine abuse   . Active smoker /    1/2 ppd  . Obesity   . Hypertension   . CHF (congestive heart failure) (HCC)   . Gout   . GERD (gastroesophageal reflux disease)   . Sleep apnea   . 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 albuterol (PROVENTIL HFA;VENTOLIN HFA) 108 (90 Base) MCG/ACT inhaler Inhale 2 puffs into the lungs every 4 (four) hours as needed for wheezing or shortness of breath. 1 Inhaler 1  . aspirin (ASPIR-LOW) 81 MG EC tablet Take 81 mg by mouth daily.     . carvedilol (COREG) 25 MG tablet Take 1 tablet (25 mg total) by mouth 2 (two) times daily. 180 tablet 2  . diphenhydrAMINE (BENADRYL) 25 MG tablet Take 25 mg by mouth every 6 (six) hours as needed for allergies.    [name] EPINEPHrine (EPIPEN) 0.3 mg/0.3 mL SOAJ injection Inject 0.3 mL (0.3 mg total) into the muscle once as needed (anaphylaxis). 1 Device 1  . furosemide (LASIX) 40 MG tablet Take 1 tablet (40 mg total) by mouth 2 (two) times daily. 270 tablet 1  . ibuprofen (ADVIL,MOTRIN) 600 MG tablet Take 1 tablet (600 mg total) by mouth every 6 (six) hours as needed. 30 tablet 1  . losartan (COZAAR) 100 MG tablet Take 1 tablet (100 mg total) by mouth daily. 30 tablet 0  . Multiple Vitamin (MULTIVITAMIN WITH MINERALS) TABS Take 1 tablet by mouth daily.    [name] omeprazole (PRILOSEC) 40 MG capsule Take 1 capsule (40 mg total) by mouth daily. 90 capsule 2  . polyethylene glycol powder (GLYCOLAX/MIRALAX) powder Take 17 g  by mouth daily. DISSOLVE 1 CAPFUL BY MOUTH DAILY AS DIRECTED 255 g 6  . spironolactone (ALDACTONE) 25 MG tablet Take 1 tablet (25 mg total) by mouth daily. 90 tablet 2   No current facility-administered medications for this visit.   Family History  Problem Relation Age of Onset  . Heart disease Mother   . Cancer Mother   . Heart disease Father    Social History   Social History  . Marital Status: Single    Spouse Name: N/A  . Number of Children: N/A  . Years of Education: N/A   Occupational History  . DISABLED    Social History Main Topics  . Smoking status: Current Every Day Smoker -- 0.75 packs/day for 38 years    Types: Cigarettes  . Smokeless tobacco: Not on file     Comment: down to .5 pk.  about 1 week  ago  . Alcohol Use: 7.2 oz/week    12 Shots of liquor per week     Comment: Vodka 1/2 pint - on/off.  . Drug Use: No     Comment: former  . Sexual Activity: Not on file   Other Topics Concern  . Not on file   Social History Narrative   Works as Gaffer. 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 Heavy ETOH in past, now back to drinking 1/2 pint/week.          No show 07/16/09   Review of Systems: Pertinent items are noted in HPI. Objective:  Physical Exam: There were no vitals filed for this visit. Physical Exam  Constitutional: He is oriented to person, place, and time and well-developed, well-nourished, and in no distress. No distress.  HENT:  Head: Normocephalic and atraumatic.  Eyes: EOM are normal. No scleral icterus.  Neck: No tracheal deviation present.  Cardiovascular: Normal rate, regular rhythm and normal heart sounds.   Pulmonary/Chest: Effort normal. No stridor. No respiratory distress. He has no wheezes.  Minimal crackles in bilateral bases.  Abdominal: Soft. He exhibits no distension. There is no tenderness. There is no rebound and no guarding.  Musculoskeletal: He exhibits no edema.  Neurological: He is alert and oriented to person, place, and time.  Skin: Skin is warm and dry. He is not diaphoretic.     Assessment & Plan:   Patient and case were discussed with Dr. Rogelia Boga.  Please refer to Problem Based charting for further documentation.

## 2015-04-01 ENCOUNTER — Other Ambulatory Visit: Payer: Self-pay | Admitting: *Deleted

## 2015-04-01 LAB — HCV RNA QUANT
HCV log10: 6 log10 IU/mL
Hepatitis C Quantitation: 1000000 IU/mL

## 2015-04-01 NOTE — Addendum Note (Signed)
Addended by: Venia Minks A on: 04/01/2015 03:37 PM   Modules accepted: Orders

## 2015-04-02 NOTE — Progress Notes (Signed)
Internal Medicine Clinic Attending  Case discussed with Dr. Taylor at the time of the visit.  We reviewed the resident's history and exam and pertinent patient test results.  I agree with the assessment, diagnosis, and plan of care documented in the resident's note. 

## 2015-04-04 LAB — HEPATITIS C GENOTYPE

## 2015-04-04 LAB — SPECIMEN STATUS REPORT

## 2015-04-07 ENCOUNTER — Other Ambulatory Visit: Payer: Self-pay | Admitting: Internal Medicine

## 2015-04-07 ENCOUNTER — Encounter: Payer: Self-pay | Admitting: Licensed Clinical Social Worker

## 2015-04-07 ENCOUNTER — Encounter: Payer: Self-pay | Admitting: Internal Medicine

## 2015-04-07 ENCOUNTER — Ambulatory Visit (INDEPENDENT_AMBULATORY_CARE_PROVIDER_SITE_OTHER): Payer: Medicaid Other | Admitting: Internal Medicine

## 2015-04-07 VITALS — BP 102/77 | HR 94 | Temp 97.7°F | Ht 67.0 in | Wt 248.0 lb

## 2015-04-07 DIAGNOSIS — Z7982 Long term (current) use of aspirin: Secondary | ICD-10-CM | POA: Diagnosis not present

## 2015-04-07 DIAGNOSIS — I5043 Acute on chronic combined systolic (congestive) and diastolic (congestive) heart failure: Secondary | ICD-10-CM

## 2015-04-07 DIAGNOSIS — Z79899 Other long term (current) drug therapy: Secondary | ICD-10-CM | POA: Diagnosis not present

## 2015-04-07 MED ORDER — FUROSEMIDE 80 MG PO TABS: 80.0000 mg | ORAL_TABLET | Freq: Two times a day (BID) | ORAL | Status: DC

## 2015-04-07 NOTE — Progress Notes (Signed)
Patient ID: Ethan Wilcox, male   DOB: 03-05-1955, 60 y.o.   MRN: [id number]   Subjective:   Patient ID: Ethan Wilcox male   DOB: 06-Mar-1955 60 y.o.   MRN: [id number]  HPI: [name] is a 60 y.o. male with PMH as below, here for heart failure follow up.  Please see Problem-Based charting for the status of the patient's chronic medical issues.     Past Medical History  Diagnosis Date  . Nonischemic cardiomyopathy (HCC)     admitted 5/11 w acute systolic CHF. LHC showed no angiographic CAD. Echo (5/11) showed EF 15-20% w diffuse sever hypokinesis, moderate MR> RHC showed PCWP 10, CI 1.47 (Fick.) cause of CMP thought to be heavy ETOH and cocaine. No ICD yet due to ongoing ETOH intake  . ETOH abuse   . Cocaine abuse   . Active smoker /    1/2 ppd  . Obesity   . Hypertension   . CHF (congestive heart failure) (HCC)   . Gout   . GERD (gastroesophageal reflux disease)   . Sleep apnea   . 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 albuterol (PROVENTIL HFA;VENTOLIN HFA) 108 (90 Base) MCG/ACT inhaler Inhale 2 puffs into the lungs every 4 (four) hours as needed for wheezing or shortness of breath. 1 Inhaler 1  . aspirin (ASPIR-LOW) 81 MG EC tablet Take 81 mg by mouth daily.     . carvedilol (COREG) 25 MG tablet Take 1 tablet (25 mg total) by mouth 2 (two) times daily. 180 tablet 2  . diphenhydrAMINE (BENADRYL) 25 MG tablet Take 25 mg by mouth every 6 (six) hours as needed for allergies.    [name] EPINEPHrine (EPIPEN) 0.3 mg/0.3 mL SOAJ injection Inject 0.3 mL (0.3 mg total) into the muscle once as needed (anaphylaxis). 1 Device 1  . furosemide (LASIX) 80 MG tablet Take 1 tablet (80 mg total) by mouth 2 (two) times daily. 60 tablet 1  . ibuprofen (ADVIL,MOTRIN) 600 MG tablet Take 1 tablet (600 mg  total) by mouth every 6 (six) hours as needed. 30 tablet 1  . losartan (COZAAR) 100 MG tablet Take 1 tablet (100 mg total) by mouth daily. 30 tablet 0  . Multiple Vitamin (MULTIVITAMIN WITH MINERALS) TABS Take 1 tablet by mouth daily.    [name] omeprazole (PRILOSEC) 40 MG capsule Take 1 capsule (40 mg total) by mouth daily. 90 capsule 2  . polyethylene glycol powder (GLYCOLAX/MIRALAX) powder Take 17 g by mouth daily. DISSOLVE 1 CAPFUL BY MOUTH DAILY AS DIRECTED 255 g 6  . spironolactone (ALDACTONE) 25 MG tablet Take 1 tablet (25 mg total) by mouth daily. 90 tablet 2   No current facility-administered medications for this visit.   Family History  Problem Relation Age of Onset  . Heart disease Mother   . Cancer Mother   . Heart disease Father    Social History   Social History  . Marital Status: Single    Spouse Name: N/A  . Number of Children: N/A  . Years of Education: N/A   Occupational History  . DISABLED    Social History Main Topics  . Smoking status: Current Every Day Smoker -- 0.75 packs/day for 38 years    Types: Cigarettes  . Smokeless tobacco: None     Comment: down  to .5 pk.  about 1 week ago.  Stopped 2.5 weeks ago  . Alcohol Use: 7.2 oz/week    12 Shots of liquor per week     Comment: Vodka 1/2 pint - on/off.  . Drug Use: No     Comment: former  . Sexual Activity: Not Asked   Other Topics Concern  . None   Social History Narrative   Works as Gaffer. 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 Heavy ETOH in past, now back to drinking 1/2 pint/week.          No show 07/16/09   Review of Systems: Endorses SOB, cough productive of yellow sputum, and lightheadedness with exertion.  He denies chest pain, leg swelling, or fevers/chills. Objective:  Physical Exam: Filed Vitals:   04/07/15 1015  BP: 102/77  Pulse: 94  Temp: 97.7 F (36.5 C)  TempSrc: Oral  Height: 5\' 7"  (1.702 m)  Weight: 248 lb (112.492 kg)  SpO2: 96%   Physical Exam  Constitutional: He is oriented  to person, place, and time and well-developed, well-nourished, and in no distress. No distress.  HENT:  Head: Normocephalic and atraumatic.  Eyes: EOM are normal. No scleral icterus.  Neck: No tracheal deviation present.  JVD could not be assessed 2/2 body habitus.  Cardiovascular: Normal rate, regular rhythm and normal heart sounds.   Pulmonary/Chest: Effort normal. No stridor. No respiratory distress. He has no wheezes.  Minimal crackles in bilateral bases.  Musculoskeletal: He exhibits no edema.  Neurological: He is alert and oriented to person, place, and time.  Skin: Skin is warm and dry. He is not diaphoretic.     Assessment & Plan:   Patient and case were discussed with Dr. Rogelia Boga.  Please refer to Problem Based charting for further documentation.

## 2015-04-07 NOTE — Progress Notes (Signed)
[name] was referred to CSW for potential options for weight scale to help manage his CHF.  CSW met with [name] and provided pt with information on CHF educational/support series.  Provided pt with CSW contact information for patient to contact this worker is he financially has difficulty affording a scale.  Pt's current weight is within local pharmacy scale limits.  Following pt's scheduled [facility] appointment, pt requesting to speak with CSW.  Pt requesting cab voucher for return trip home.  Pt notified this worker unable to provide voucher, but able to provide pt with Medicaid bus pass.  CSW provided [name] with one way Medicaid bus pass and information on scheduling Medicaid medical transportation.  Pt denies add'l social work needs at this time.

## 2015-04-07 NOTE — Assessment & Plan Note (Addendum)
Since last visit, patient has been doing well with no/low salt diet.  He is eating chicken and no salt Dash.  He drinks about half a gallon of water a day.   However, he is SOB, has continued cough productive of yellow sputum, 1 pillow orthopnea, and feeling lightheaded and SOB with exertion.  He has been taking his Lasix 40 mg BID since last week.  His weight today is 248, up from 244 last week, and up from 238-239 dry weight from admission.    A/P: Combined CHF, worsening, with increasing weight gain and SOB at rest and with exertion.  Patient now almost 10 lbs up from dry weight with worsening symptoms.  Will increase Lasix therapy and continue Spironolactone and reassess patient next week.  He will likely need labs at that time to check electrolytes. - Lasix 80 mg PO BID - Spironolactone 25 mg daily - Continue Carvedilol, ASA, Losartan - Purchase scale for daily weights - HF support group.

## 2015-04-07 NOTE — Patient Instructions (Signed)
1. Increase Lasix to 80 mg twice daily. 2. Continue Spironolactone. 3. Return to clinic in 1 week.  Heart Failure Heart failure means your heart has trouble pumping blood. This makes it hard for your body to work well. Heart failure is usually a long-term (chronic) condition. You must take good care of yourself and follow your doctor's treatment plan. HOME CARE  Take your heart medicine as told by your doctor.  Do not stop taking medicine unless your doctor tells you to.  Do not skip any dose of medicine.  Refill your medicines before they run out.  Take other medicines only as told by your doctor or pharmacist.  Stay active if told by your doctor. The elderly and people with severe heart failure should talk with a doctor about physical activity.  Eat heart-healthy foods. Choose foods that are without trans fat and are low in saturated fat, cholesterol, and salt (sodium). This includes fresh or frozen fruits and vegetables, fish, lean meats, fat-free or low-fat dairy foods, whole grains, and high-fiber foods. Lentils and dried peas and beans (legumes) are also good choices.  Limit salt if told by your doctor.  Cook in a healthy way. Roast, grill, broil, bake, poach, steam, or stir-fry foods.  Limit fluids as told by your doctor.  Weigh yourself every morning. Do this after you pee (urinate) and before you eat breakfast. Write down your weight to give to your doctor.  Take your blood pressure and write it down if your doctor tells you to.  Ask your doctor how to check your pulse. Check your pulse as told.  Lose weight if told by your doctor.  Stop smoking or chewing tobacco. Do not use gum or patches that help you quit without your doctor's approval.  Schedule and go to doctor visits as told.  Nonpregnant women should have no more than 1 drink a day. Men should have no more than 2 drinks a day. Talk to your doctor about drinking alcohol.  Stop illegal drug use.  Stay  current with shots (immunizations).  Manage your health conditions as told by your doctor.  Learn to manage your stress.  Rest when you are tired.  If it is really hot outside:  Avoid intense activities.  Use air conditioning or fans, or get in a cooler place.  Avoid caffeine and alcohol.  Wear loose-fitting, lightweight, and light-colored clothing.  If it is really cold outside:  Avoid intense activities.  Layer your clothing.  Wear mittens or gloves, a hat, and a scarf when going outside.  Avoid alcohol.  Learn about heart failure and get support as needed.  Get help to maintain or improve your quality of life and your ability to care for yourself as needed. GET HELP IF:   You gain weight quickly.  You are more short of breath than usual.  You cannot do your normal activities.  You tire easily.  You cough more than normal, especially with activity.  You have any or more puffiness (swelling) in areas such as your hands, feet, ankles, or belly (abdomen).  You cannot sleep because it is hard to breathe.  You feel like your heart is beating fast (palpitations).  You get dizzy or light-headed when you stand up. GET HELP RIGHT AWAY IF:   You have trouble breathing.  There is a change in mental status, such as becoming less alert or not being able to focus.  You have chest pain or discomfort.  You faint. MAKE SURE  YOU:   Understand these instructions.  Will watch your condition.  Will get help right away if you are not doing well or get worse.   This information is not intended to replace advice given to you by your health care provider. Make sure you discuss any questions you have with your health care provider.   Document Released: 10/05/2007 Document Revised: 01/16/2014 Document Reviewed: 02/12/2012 Elsevier Interactive Patient Education [employer]

## 2015-04-08 NOTE — Progress Notes (Signed)
Internal Medicine Clinic Attending  Case discussed with Dr. Taylor at the time of the visit.  We reviewed the resident's history and exam and pertinent patient test results.  I agree with the assessment, diagnosis, and plan of care documented in the resident's note. 

## 2015-04-14 ENCOUNTER — Encounter: Payer: Self-pay | Admitting: Internal Medicine

## 2015-04-14 ENCOUNTER — Ambulatory Visit (INDEPENDENT_AMBULATORY_CARE_PROVIDER_SITE_OTHER): Payer: Medicaid Other | Admitting: Internal Medicine

## 2015-04-14 VITALS — HR 103 | Temp 98.6°F | Wt 248.8 lb

## 2015-04-14 DIAGNOSIS — I5022 Chronic systolic (congestive) heart failure: Secondary | ICD-10-CM

## 2015-04-14 DIAGNOSIS — R894 Abnormal immunological findings in specimens from other organs, systems and tissues: Secondary | ICD-10-CM | POA: Diagnosis not present

## 2015-04-14 DIAGNOSIS — G4733 Obstructive sleep apnea (adult) (pediatric): Secondary | ICD-10-CM

## 2015-04-14 DIAGNOSIS — R768 Other specified abnormal immunological findings in serum: Secondary | ICD-10-CM

## 2015-04-14 MED ORDER — FUROSEMIDE 40 MG PO TABS: 40.0000 mg | ORAL_TABLET | Freq: Two times a day (BID) | ORAL | Status: DC

## 2015-04-14 NOTE — Progress Notes (Signed)
Internal Medicine Clinic Attending  Case discussed with Dr. Gill at the time of the visit.  We reviewed the resident's history and exam and pertinent patient test results.  I agree with the assessment, diagnosis, and plan of care documented in the resident's note.  

## 2015-04-14 NOTE — Assessment & Plan Note (Addendum)
Pt reports feeling dizziness recently especially while standing.  Orthostatic neg.  Also reports some muscle cramping.  Has been taking furosemide 80mg  bid.  BP 92/64.  He has also taken benadryl this AM which could be contributing.  Weight is stable.  O2 sat normal.  Is also taking ibuprofen occasionally. -decrease lasix to 40mg  bid -HF clinic referral? -limit benadryl  -advised to d/c ibuprofen as this may cause additional fluid retention and also should not be taken with lasix  -BMP today

## 2015-04-14 NOTE — Assessment & Plan Note (Addendum)
-  awaiting referral to ID clinic?  Purnell Shoemaker checked and stated it may be a few more weeks.

## 2015-04-14 NOTE — Patient Instructions (Signed)
Thank you for your visit today.   Please return to the internal medicine clinic in 1 month(s) or sooner if needed.     I have made the following additions/changes to your medications: Decrease lasix to 40mg  twice daily.  You may cut the 80mg  in half and take twice daily.   Please do not use ibuprofen as this may worsen your fluid retention. Also, limit sodium to 2000mg  per day. We will try to see if we can get a scale for you to weight yourself daily.   We will also try to schedule a sleep study for you to be able to get a CPAP machine.   Please be sure to bring all of your medications with you to every visit; this includes herbal supplements, vitamins, eye drops, and any over-the-counter medications.   Should you have any questions regarding your medications and/or any new or worsening symptoms, please be sure to call the clinic at [phone].   If you believe that you are suffering from a life threatening condition or one that may result in the loss of limb or function, then you should call 911 and proceed to the nearest Emergency Department.   A healthy lifestyle and preventative care can promote health and wellness.   Maintain regular health, dental, and eye exams.  Eat a healthy diet. Foods like vegetables, fruits, whole grains, low-fat dairy products, and lean protein foods contain the nutrients you need without too many calories. Decrease your intake of foods high in solid fats, added sugars, and salt. Get information about a proper diet from your caregiver, if necessary.  Regular physical exercise is one of the most important things you can do for your health. Most adults should get at least 150 minutes of moderate-intensity exercise (any activity that increases your heart rate and causes you to sweat) each week. In addition, most adults need muscle-strengthening exercises on 2 or more days a week.   Maintain a healthy weight. The body mass index (BMI) is a screening tool to  identify possible weight problems. It provides an estimate of body fat based on height and weight. Your caregiver can help determine your BMI, and can help you achieve or maintain a healthy weight. For adults 20 years and older:  A BMI below 18.5 is considered underweight.  A BMI of 18.5 to 24.9 is normal.  A BMI of 25 to 29.9 is considered overweight.  A BMI of 30 and above is considered obese.

## 2015-04-14 NOTE — Assessment & Plan Note (Signed)
-  will set up for a new sleep study (had sleep study years ago), not currently using CPAP

## 2015-04-14 NOTE — Progress Notes (Signed)
Patient ID: Ethan Wilcox, male   DOB: Oct 18, 1955, 60 y.o.   MRN: [id number]     Subjective:   Patient ID: Ethan Wilcox male    DOB: 06-Jan-1956 60 y.o.    MRN: [id number] Health Maintenance Due: Health Maintenance Due  Topic Date Due  . COLONOSCOPY  11/17/2006    _________________________________________________  HPI: [name] is a 60 y.o. male here for a f/u visit for HF.  Pt has a PMH outlined below.  Please see problem-based charting assessment and plan for further status of patient's chronic medical problems addressed at today's visit.  PMH: Past Medical History  Diagnosis Date  . Nonischemic cardiomyopathy (HCC)     admitted 5/11 w acute systolic CHF. LHC showed no angiographic CAD. Echo (5/11) showed EF 15-20% w diffuse sever hypokinesis, moderate MR> RHC showed PCWP 10, CI 1.47 (Fick.) cause of CMP thought to be heavy ETOH and cocaine. No ICD yet due to ongoing ETOH intake  . ETOH abuse   . Cocaine abuse   . Active smoker /    1/2 ppd  . Obesity   . Hypertension   . CHF (congestive heart failure) (HCC)   . Gout   . GERD (gastroesophageal reflux disease)   . Sleep apnea   . Hx of anaphylaxis with angioedema 08/23/2012    Admitted with angioedema on 08/24/2012 No intubation Unknown allergen Shrimp Vs Spices Vs ACEis Stopped Lisinopril     Medications: Current Outpatient Prescriptions on File Prior to Visit  Medication Sig Dispense Refill  . albuterol (PROVENTIL HFA;VENTOLIN HFA) 108 (90 Base) MCG/ACT inhaler Inhale 2 puffs into the lungs every 4 (four) hours as needed for wheezing or shortness of breath. 1 Inhaler 1  . aspirin (ASPIR-LOW) 81 MG EC tablet Take 81 mg by mouth daily.     . carvedilol (COREG) 25 MG tablet Take 1 tablet (25 mg total) by mouth 2 (two) times daily. 180 tablet 2  . diphenhydrAMINE (BENADRYL) 25 MG tablet Take 25 mg by mouth every 6 (six) hours as needed for allergies.    [name] EPINEPHrine (EPIPEN) 0.3 mg/0.3 mL SOAJ injection Inject  0.3 mL (0.3 mg total) into the muscle once as needed (anaphylaxis). 1 Device 1  . furosemide (LASIX) 80 MG tablet TAKE 1 TABLET BY MOUTH TWICE DAILY 180 tablet 1  . ibuprofen (ADVIL,MOTRIN) 600 MG tablet Take 1 tablet (600 mg total) by mouth every 6 (six) hours as needed. 30 tablet 1  . losartan (COZAAR) 100 MG tablet Take 1 tablet (100 mg total) by mouth daily. 30 tablet 0  . Multiple Vitamin (MULTIVITAMIN WITH MINERALS) TABS Take 1 tablet by mouth daily.    [name] omeprazole (PRILOSEC) 40 MG capsule Take 1 capsule (40 mg total) by mouth daily. 90 capsule 2  . polyethylene glycol powder (GLYCOLAX/MIRALAX) powder Take 17 g by mouth daily. DISSOLVE 1 CAPFUL BY MOUTH DAILY AS DIRECTED 255 g 6  . spironolactone (ALDACTONE) 25 MG tablet Take 1 tablet (25 mg total) by mouth daily. 90 tablet 2   No current facility-administered medications on file prior to visit.    Allergies: Allergies  Allergen Reactions  . Lisinopril Anaphylaxis    Possible cause of anaphylaxis episode on 08/24/2012 which required admission.  . Shrimp [Shellfish Allergy] Anaphylaxis  . Penicillins Other (See Comments)    unknowN    FH: Family History  Problem Relation Age of Onset  . Heart disease Mother   . Cancer Mother   . Heart  disease Father     SH: Social History   Social History  . Marital Status: Single    Spouse Name: N/A  . Number of Children: N/A  . Years of Education: N/A   Occupational History  . DISABLED    Social History Main Topics  . Smoking status: Former Smoker -- 0.75 packs/day for 38 years    Types: Cigarettes    Quit date: 03/14/2015  . Smokeless tobacco: None     Comment: down to .5 pk.  about 1 week ago.  Stopped 2.5 weeks ago  . Alcohol Use: No     Comment: Vodka 1/2 pint - on/off.  . Drug Use: No     Comment: former  . Sexual Activity: Not Asked   Other Topics Concern  . None   Social History Narrative   Works as Gaffer. 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 Heavy ETOH in past,  now back to drinking 1/2 pint/week.          No show 07/16/09    Review of Systems: Constitutional: Negative for fever, chills and weight loss.  Eyes: Negative for blurred vision.  Respiratory: Negative for cough and shortness of breath.  Cardiovascular: Negative for leg swelling.  Genitourinary: Negative for frequency.  Musculoskeletal: Negative for myalgias and back pain.  Neurological: +dizziness, neg weakness and neg headaches.     Objective:   Vital Signs: Filed Vitals:   04/14/15 1026  Pulse: 103  Temp: 98.6 F (37 C)  TempSrc: Oral  Weight: 248 lb 12.8 oz (112.855 kg)  SpO2: 96%      BP Readings from Last 3 Encounters:  04/07/15 102/77  03/31/15 158/88  03/25/15 100/69    Physical Exam: Constitutional: Vital signs reviewed.  Patient is in NAD and cooperative with exam.  Head: Normocephalic and atraumatic. Eyes: EOMI, conjunctivae nl, no scleral icterus.  Neck: Supple. Cardiovascular: RRR, no MRG. Pulmonary/Chest: normal effort, CTAB, no wheezes, rales, or rhonchi. Abdominal: Soft. Obese. NT/ND +BS. Neurological: A&O x3, cranial nerves II-XII are grossly intact, moving all extremities. Extremities: No LE edema. Skin: Warm, dry and intact. No rash.   Assessment & Plan:   Assessment and plan was discussed and formulated with my attending.

## 2015-04-15 LAB — BMP8+ANION GAP
ANION GAP: 22 mmol/L — AB (ref 10.0–18.0)
BUN/Creatinine Ratio: 20 (ref 9–20)
BUN: 27 mg/dL — ABNORMAL HIGH (ref 6–24)
CALCIUM: 9.8 mg/dL (ref 8.7–10.2)
CO2: 24 mmol/L (ref 18–29)
CREATININE: 1.36 mg/dL — AB (ref 0.76–1.27)
Chloride: 94 mmol/L — ABNORMAL LOW (ref 96–106)
GFR calc Af Amer: 66 mL/min/{1.73_m2} (ref >59)
GFR, EST NON AFRICAN AMERICAN: 57 mL/min/{1.73_m2} — AB (ref >59)
Glucose: 122 mg/dL — ABNORMAL HIGH (ref 65–99)
Potassium: 4.5 mmol/L (ref 3.5–5.2)
SODIUM: 140 mmol/L (ref 134–144)

## 2015-04-20 ENCOUNTER — Telehealth (HOSPITAL_COMMUNITY): Payer: Self-pay | Admitting: Vascular Surgery

## 2015-04-20 NOTE — Telephone Encounter (Signed)
Can not get in touch with pt , will send him a reminder letter

## 2015-04-29 ENCOUNTER — Other Ambulatory Visit: Payer: Medicaid Other

## 2015-04-29 DIAGNOSIS — B182 Chronic viral hepatitis C: Secondary | ICD-10-CM

## 2015-04-29 LAB — PROTIME-INR
INR: 0.98 (ref <1.50)
PROTHROMBIN TIME: 13.1 s (ref 11.6–15.2)

## 2015-04-29 LAB — IRON: IRON: 88 ug/dL (ref 50–180)

## 2015-04-30 LAB — HEPATITIS B CORE ANTIBODY, TOTAL: Hep B Core Total Ab: REACTIVE — AB

## 2015-04-30 LAB — ANA: ANA: POSITIVE — AB

## 2015-04-30 LAB — HEPATITIS A ANTIBODY, TOTAL: Hep A Total Ab: REACTIVE — AB

## 2015-04-30 LAB — ANTI-NUCLEAR AB-TITER (ANA TITER): ANA Titer 1: 1:80 {titer} — ABNORMAL HIGH

## 2015-04-30 LAB — HEPATITIS B SURFACE ANTIBODY,QUALITATIVE: HEP B S AB: POSITIVE — AB

## 2015-04-30 LAB — HEPATITIS B SURFACE ANTIGEN: HEP B S AG: NEGATIVE

## 2015-05-14 ENCOUNTER — Ambulatory Visit (HOSPITAL_COMMUNITY)
Admission: RE | Admit: 2015-05-14 | Discharge: 2015-05-14 | Disposition: A | Discharge status: Home / Self Care | Payer: Medicaid Other | Source: Ambulatory Visit | Attending: Cardiology | Admitting: Cardiology

## 2015-05-14 ENCOUNTER — Encounter (HOSPITAL_COMMUNITY): Payer: Self-pay

## 2015-05-14 VITALS — BP 130/84 | HR 77 | Wt 257.0 lb

## 2015-05-14 DIAGNOSIS — Z79899 Other long term (current) drug therapy: Secondary | ICD-10-CM | POA: Insufficient documentation

## 2015-05-14 DIAGNOSIS — I5043 Acute on chronic combined systolic (congestive) and diastolic (congestive) heart failure: Secondary | ICD-10-CM

## 2015-05-14 DIAGNOSIS — G4733 Obstructive sleep apnea (adult) (pediatric): Secondary | ICD-10-CM | POA: Diagnosis not present

## 2015-05-14 DIAGNOSIS — F17210 Nicotine dependence, cigarettes, uncomplicated: Secondary | ICD-10-CM | POA: Insufficient documentation

## 2015-05-14 DIAGNOSIS — I428 Other cardiomyopathies: Secondary | ICD-10-CM | POA: Insufficient documentation

## 2015-05-14 DIAGNOSIS — I5022 Chronic systolic (congestive) heart failure: Secondary | ICD-10-CM | POA: Insufficient documentation

## 2015-05-14 DIAGNOSIS — Z7982 Long term (current) use of aspirin: Secondary | ICD-10-CM | POA: Insufficient documentation

## 2015-05-14 DIAGNOSIS — E669 Obesity, unspecified: Secondary | ICD-10-CM | POA: Insufficient documentation

## 2015-05-14 MED ORDER — LOSARTAN POTASSIUM 100 MG PO TABS: 100.0000 mg | ORAL_TABLET | Freq: Every day | ORAL | Status: DC

## 2015-05-14 NOTE — Patient Instructions (Signed)
INCREASE Losartan to 100 mg once daily.  Labs in 1-2 weeks.  Follow up 3 months with Dr. Shirlee Latch.  Do the following things EVERYDAY: 1) Weigh yourself in the morning before breakfast. Write it down and keep it in a log. 2) Take your medicines as prescribed 3) Eat low salt foods-Limit salt (sodium) to 2000 mg per day.  4) Stay as active as you can everyday 5) Limit all fluids for the day to less than 2 liters

## 2015-05-16 NOTE — Progress Notes (Signed)
Patient ID: Ethan Wilcox, male   DOB: 12/19/1955, 60 y.o.   MRN: [id number] PCP: Dr. Zada Girt Cardiology: Dr. Shirlee Latch  60 yo with history of nonischemic cardiomyopathy presents for cardiology followup. Ethan Wilcox had a prolonged admission in 5/11 for CHF. Echo showed EF 15-20% with diffuse hypokinesis. Left and right heart cath showed no angiographic CAD and Fick CI 1.47. Patient became hypotensive with ARF (cardiogenic shock) requiring milrinone and dopamine. He was gradually titrated off these medications and begun on an oral regimen.  He saw Dr. Ladona Ridgel regarding an ICD but actually at that time admitted to resuming ETOH intake. Therefore, no ICD yet.  Echo in 5/15 showed EF 30-35%, diffuse hypokinesis.  He has had a hospitalization with angioedema.  This actually may have been related to eating shellfish but lisinopril was stopped.    I have not seen him for a couple of years now.  Weight has actually trended down overall.  Still drinking ETOH but not heavily anymore.  No dyspnea walking on flat ground and ok with steps.  No chest pain.  No orthopnea PND.  He was admitted in 3/17 with a CHF exacerbation.    Labs (5/11): K 4.4, creatinine 1.23, LDL 104, HDL 33  Labs (6/11): BNP 97, K 4.8, creatinine 1.4  Labs (7/11): K 4.8, creatinine 1.0, BNP 53  Labs (10/11): BNP 25, K 4.8 => 4.3, creatinine 1.4 => 1.3  Labs (12/11): BNP 25, K 4.9, creatinine 1.2  Labs (3/12): K 4.3, creatinine 1.2 Labs (11/12): K 4, creatinine 1.3, BNP 82, TGs 518, HDL 39, HDL 60 Labs (2/13): AST 72, ALT 96 Labs (4/13): K 4.4, creatinine 0.95, proBNP 185 Labs (5/13): K 4.3, creatinine 1.2, BNP 32 Labs (7/13): K 4.1, creatinine 1.2, BNP 15 Labs (8/13): K 4.6, creatinine 1.0 Labs (11/13): K 3.9, creatinine 1.1, BNP 32 Labs (4/14): K 4.8, creatinine 1.2, BNP 28 Labs (8/14): K 4.1, creatinine 1.0 Labs (2/15): K 4.1, creatinine 1.09 Labs (3/15): K 3.8, creatinine 1.1 Labs (8/15): K 3.5, creatinine 1.1 Labs (4/17): K 4.5,  creatinine 1.36  Allergies (verified):  1) ! Penicillin   Past Medical History:  1. Nonischemic Cardiomyopathy: Admitted 5/11 with acute systolic CHF. LHC showed no angiographic CAD. Echo (5/11) showed EF 15-20% with diffuse severe hypokinesis, moderate MR. RHC showed PCWP 10, CI 1.47 (Fick).  Echo (4/14) with EF 35%, diffuse hypokinesis.  Echo (5/15) with EF 30-35%, mild LV dilation, mild MR.  Cause of CMP thought to be heavy ETOH and cocaine. No ICD yet due to ongoing ETOH intake.  - Echo (3/17) with EF 40-45%, diffuse hypokinesis, mild MR.  - Unable to tolerate Bidil due to headaches. 2. ETOH abuse 3. Cocaine abuse: Quit in 2011  4. Active smoker: 1/2 ppd  5. Obesity  6. OSA: Severe 7. Angioedema: ? ACEI or shellfish related.   8. 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 Heavy ETOH in the past, now drinking around 1/2 pint liquor daily.    ROS: All systems reviewed and negative except as per HPI.    Current Outpatient Prescriptions  Medication Sig Dispense Refill  . albuterol (PROVENTIL HFA;VENTOLIN HFA) 108 (90 Base) MCG/ACT inhaler Inhale 2 puffs into the lungs every 4 (four) hours as needed for wheezing or shortness of breath. 1 Inhaler 1  . aspirin (ASPIR-LOW) 81 MG EC tablet Take 81 mg by mouth daily.     [name]  carvedilol (COREG) 25 MG tablet Take 1 tablet (25 mg total) by mouth 2 (two) times daily. 180 tablet 2  . diphenhydrAMINE (BENADRYL) 25 MG tablet Take 25 mg by mouth every 6 (six) hours as needed for allergies.    . Diphenhydramine-APAP, sleep, (ACETAMINOPHEN PM PO) Take by mouth.    . EPINEPHrine (EPIPEN) 0.3 mg/0.3 mL SOAJ injection Inject 0.3 mL (0.3 mg total) into the muscle once as needed (anaphylaxis). 1 Device 1  . furosemide (LASIX) 40 MG tablet Take 1 tablet (40 mg total) by mouth 2 (two) times daily. 60 tablet 1  . losartan (COZAAR) 100 MG tablet Take 1 tablet (100 mg  total) by mouth daily. 30 tablet 6  . Multiple Vitamin (MULTIVITAMIN WITH MINERALS) TABS Take 1 tablet by mouth daily.    [name] omeprazole (PRILOSEC) 40 MG capsule Take 1 capsule (40 mg total) by mouth daily. 90 capsule 2  . polyethylene glycol powder (GLYCOLAX/MIRALAX) powder Take 17 g by mouth daily. DISSOLVE 1 CAPFUL BY MOUTH DAILY AS DIRECTED 255 g 6  . spironolactone (ALDACTONE) 25 MG tablet Take 1 tablet (25 mg total) by mouth daily. 90 tablet 2   No current facility-administered medications for this encounter.    BP 130/84 mmHg  Pulse 77  Wt 257 lb (116.574 kg)  SpO2 96% General: NAD, obese Neck: Thick, JVP difficult but does not appear particularly elevated today, no thyromegaly or thyroid nodule.  Lungs: Decreased breath sounds at bases.  CV: Nondisplaced PMI.  Heart regular S1/S2, no S3/S4, no murmur.  No peripheral edema.  No carotid bruit.  Normal pedal pulses.  Abdomen: Soft, nontender, no hepatosplenomegaly, no distention.  Neurologic: Alert and oriented x 3.  Psych: Normal affect. Extremities: No clubbing or cyanosis.   Assessment/Plan:  CHRONIC SYSTOLIC HEART FAILURE  Nonischemic cardiomyopathy, possibly ETOH-related.  Echo in 5/15 with EF 30-35%, improved on 3/17 echo to EF 40-45%. Stable NYHA class II symptoms.  He is still drinking but has cut back a lot. He had angioedema with ACEI use.  - Continue current doses of Coreg and spironolactone.  - He cannot take Bidil due to headaches. - Increase losartan to 100 mg daily.  I will not transition to [facility] given higher risk of angioedema with this agent than with losartan.  BMET in 10 days.  - Continue Lasix at 40 bid.   OSA  I encouraged him to be more regular with CPAP use. TOBACCO ABUSE I counselled him again to quit smoking. I recommended that he try nicotine patches.    [name] 05/16/2015

## 2015-05-24 ENCOUNTER — Encounter: Payer: Self-pay | Admitting: Internal Medicine

## 2015-05-24 ENCOUNTER — Ambulatory Visit (INDEPENDENT_AMBULATORY_CARE_PROVIDER_SITE_OTHER): Payer: Medicaid Other | Admitting: Internal Medicine

## 2015-05-24 VITALS — BP 126/78 | HR 57 | Temp 98.0°F | Wt 268.1 lb

## 2015-05-24 DIAGNOSIS — F17200 Nicotine dependence, unspecified, uncomplicated: Secondary | ICD-10-CM

## 2015-05-24 DIAGNOSIS — G4730 Sleep apnea, unspecified: Secondary | ICD-10-CM | POA: Insufficient documentation

## 2015-05-24 DIAGNOSIS — I5022 Chronic systolic (congestive) heart failure: Secondary | ICD-10-CM | POA: Diagnosis not present

## 2015-05-24 DIAGNOSIS — R894 Abnormal immunological findings in specimens from other organs, systems and tissues: Secondary | ICD-10-CM

## 2015-05-24 DIAGNOSIS — Z0000 Encounter for general adult medical examination without abnormal findings: Secondary | ICD-10-CM | POA: Insufficient documentation

## 2015-05-24 DIAGNOSIS — F17211 Nicotine dependence, cigarettes, in remission: Secondary | ICD-10-CM | POA: Diagnosis not present

## 2015-05-24 DIAGNOSIS — R768 Other specified abnormal immunological findings in serum: Secondary | ICD-10-CM

## 2015-05-24 DIAGNOSIS — I110 Hypertensive heart disease with heart failure: Secondary | ICD-10-CM | POA: Diagnosis not present

## 2015-05-24 DIAGNOSIS — G4733 Obstructive sleep apnea (adult) (pediatric): Secondary | ICD-10-CM | POA: Diagnosis not present

## 2015-05-24 DIAGNOSIS — R3129 Other microscopic hematuria: Secondary | ICD-10-CM | POA: Diagnosis not present

## 2015-05-24 DIAGNOSIS — I10 Essential (primary) hypertension: Secondary | ICD-10-CM

## 2015-05-24 NOTE — Assessment & Plan Note (Signed)
He qualifies for low dose CT scan of the lung based on his > 40 pack year smoking history. He continues to smoke 0.5 ppd. Pros and cons explained and pt is amenable.  -referred for CT

## 2015-05-24 NOTE — Assessment & Plan Note (Signed)
Referred for colonoscopy

## 2015-05-24 NOTE — Assessment & Plan Note (Signed)
HCV Ab positive and viral load > 100,000. Has ID appt on 5/31  -ordered US elastography.  -encouraged him to follow up with ID

## 2015-05-24 NOTE — Assessment & Plan Note (Signed)
Pt recently went to HF clinic on 5/5. He has had several CHF exacerbations, last one being in March. He says he gained few pounds but not in the last 2 weeks. He does not have a scale at home so does not check his weight. He is compliant with low salt diet. He denies dyspnea and orthopnea, and PND. He is compliant with his meds.  He is on coreg 25 mg bid, losartan, lasix 40 mg bid, and spironolactone.    -management per Heart failure team and appreciate the recs.  -encouraged him to check his weights. -CSW referral to obtain scale.

## 2015-05-24 NOTE — Assessment & Plan Note (Signed)
BP Readings from Last 3 Encounters:  05/24/15 126/78  05/14/15 130/84  04/07/15 102/77   Blood pressure is well controlled on Coreg, spironolactone, lasix.and losartan Continue current regimen- he follows HF team

## 2015-05-24 NOTE — Patient Instructions (Addendum)
Thank you for your visit today Please follow up with the infectious disease doctor for your Hepatitis C treatment Please do the image of the liver that we ordered Please do the CT scan of the lung

## 2015-05-24 NOTE — Assessment & Plan Note (Signed)
Microscopic hematuria found during last hospital admission. He currently denies gross hematuria.  -repeat UA

## 2015-05-24 NOTE — Progress Notes (Signed)
Patient ID: Ethan Wilcox, male   DOB: Mar 18, 1955, 60 y.o.   MRN: [id number]     Subjective:   Patient ID: Ethan Wilcox male   DOB: December 05, 1955 60 y.o.   MRN: [id number]  HPI: [name] is a 60 y.o. man with PMH listed below here for a follow up.    Please see Problem List/A&P for the status of the patient's chronic medical problems      Past Medical History  Diagnosis Date  . Nonischemic cardiomyopathy (HCC)     admitted 5/11 w acute systolic CHF. LHC showed no angiographic CAD. Echo (5/11) showed EF 15-20% w diffuse sever hypokinesis, moderate MR> RHC showed PCWP 10, CI 1.47 (Fick.) cause of CMP thought to be heavy ETOH and cocaine. No ICD yet due to ongoing ETOH intake  . ETOH abuse   . Cocaine abuse   . Active smoker /    1/2 ppd  . Obesity   . Hypertension   . CHF (congestive heart failure) (HCC)   . Gout   . GERD (gastroesophageal reflux disease)   . Sleep apnea   . 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 albuterol (PROVENTIL HFA;VENTOLIN HFA) 108 (90 Base) MCG/ACT inhaler Inhale 2 puffs into the lungs every 4 (four) hours as needed for wheezing or shortness of breath. 1 Inhaler 1  . aspirin (ASPIR-LOW) 81 MG EC tablet Take 81 mg by mouth daily.     . carvedilol (COREG) 25 MG tablet Take 1 tablet (25 mg total) by mouth 2 (two) times daily. 180 tablet 2  . furosemide (LASIX) 40 MG tablet Take 1 tablet (40 mg total) by mouth 2 (two) times daily. 60 tablet 1  . losartan (COZAAR) 100 MG tablet Take 1 tablet (100 mg total) by mouth daily. 30 tablet 6  . Multiple Vitamin (MULTIVITAMIN WITH MINERALS) TABS Take 1 tablet by mouth daily.    [name] omeprazole (PRILOSEC) 40 MG capsule Take 1 capsule (40 mg total) by mouth daily. 90 capsule 2  . polyethylene glycol powder  (GLYCOLAX/MIRALAX) powder Take 17 g by mouth daily. DISSOLVE 1 CAPFUL BY MOUTH DAILY AS DIRECTED 255 g 6  . spironolactone (ALDACTONE) 25 MG tablet Take 1 tablet (25 mg total) by mouth daily. 90 tablet 2  . diphenhydrAMINE (BENADRYL) 25 MG tablet Take 25 mg by mouth every 6 (six) hours as needed for allergies. Reported on 05/24/2015    . Diphenhydramine-APAP, sleep, (ACETAMINOPHEN PM PO) Take by mouth. Reported on 05/24/2015    . EPINEPHrine (EPIPEN) 0.3 mg/0.3 mL SOAJ injection Inject 0.3 mL (0.3 mg total) into the muscle once as needed (anaphylaxis). (Patient not taking: Reported on 05/24/2015) 1 Device 1  . ibuprofen (ADVIL,MOTRIN) 600 MG tablet Reported on 05/24/2015  1  . zolpidem (AMBIEN) 5 MG tablet Reported on 05/24/2015  2   No current facility-administered medications for this visit.   Family History  Problem Relation Age of Onset  . Heart disease Mother   . Cancer Mother   . Heart disease Father    Social History   Social History  . Marital Status: Single    Spouse Name: N/A  . Number of Children: N/A  . Years of Education: N/A   Occupational History  . DISABLED    Social History Main Topics  .  Smoking status: Former Smoker -- 0.75 packs/day for 38 years    Types: Cigarettes    Quit date: 03/14/2015  . Smokeless tobacco: None     Comment: down to .5 pk.  about 1 week ago.  Stopped 2.5 weeks ago  . Alcohol Use: No     Comment: Vodka 1/2 pint - on/off.  . Drug Use: No     Comment: former  . Sexual Activity: Not Asked   Other Topics Concern  . None   Social History Narrative   Works as Gaffer. 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 Heavy ETOH in past, now back to drinking 1/2 pint/week.          No show 07/16/09   Review of Systems: A comprehensive ROS is obtained and is noted in either in HPI or in problem list, and remainder of comprehensive ROS otherwise negative   Constitutional: some weight gain, but none in the last 2 weeks Respiratory: Negative for cough,  shortness of breath and wheezing.  Cardiovascular: Negative for chest pain, orthopnea, or PND   Gastrointestinal: Negative for nausea, vomiting, abdominal pain, diarrhea and constipation.   Objective:  Physical Exam: Filed Vitals:   05/24/15 1523  BP: 126/78  Pulse: 57  Temp: 98 F (36.7 C)  TempSrc: Oral  Weight: 268 lb 1.6 oz (121.609 kg)  SpO2: 100%    General: A&O, in NAD CV: RRR, normal s1, s2, no m/r/g, no carotid bruits appreciated Resp: equal and symmetric breath sounds, no wheezing or crackles  Abdomen: soft, nontender, nondistended, +BS in all 4 quadrants Skin: warm, dry, intact, no open lesions or rashes noted Extremities: pulses intact b/l, no edema   Assessment & Plan:   Please see problem based charting for assessment and plan

## 2015-05-24 NOTE — Assessment & Plan Note (Signed)
Still waiting for the appointment. Encouraged him to go to it

## 2015-05-25 NOTE — Progress Notes (Signed)
Internal Medicine Clinic Attending  Case discussed with Dr. Saraiya at the time of the visit.  We reviewed the resident's history and exam and pertinent patient test results.  I agree with the assessment, diagnosis, and plan of care documented in the resident's note.  

## 2015-05-27 ENCOUNTER — Telehealth: Payer: Self-pay | Admitting: Licensed Clinical Social Worker

## 2015-05-27 NOTE — Telephone Encounter (Signed)
Ethan Wilcox was referred to CSW for resources to obtain a scale to weigh himself and manage his CHF.  CSW placed call to Ethan Wilcox, pt confirmed that he is established with the CHF Clinic and confirmed his appointment on 05/28/15.  Pt notified CHF Clinic will be able to assist him with obtaining a scale.  CSW encouraged Ethan Wilcox upon check at the clinic to inquire if D. Wood RN is available and she would assist with providing pt with scale.  Ethan Wilcox and to obtain scale on his 05/28/15 appointment.

## 2015-05-28 ENCOUNTER — Other Ambulatory Visit: Payer: Self-pay

## 2015-05-28 ENCOUNTER — Ambulatory Visit (HOSPITAL_COMMUNITY)
Admission: RE | Admit: 2015-05-28 | Discharge: 2015-05-28 | Disposition: A | Discharge status: Home / Self Care | Payer: Medicaid Other | Source: Ambulatory Visit | Attending: Cardiology | Admitting: Cardiology

## 2015-05-28 ENCOUNTER — Emergency Department (HOSPITAL_COMMUNITY)
Admission: EM | Admit: 2015-05-28 | Discharge: 2015-05-28 | Disposition: A | Discharge status: Home / Self Care | Payer: Medicaid Other | Attending: Emergency Medicine | Admitting: Emergency Medicine

## 2015-05-28 ENCOUNTER — Encounter (HOSPITAL_COMMUNITY): Payer: Self-pay | Admitting: Emergency Medicine

## 2015-05-28 DIAGNOSIS — I509 Heart failure, unspecified: Secondary | ICD-10-CM | POA: Diagnosis not present

## 2015-05-28 DIAGNOSIS — I5043 Acute on chronic combined systolic (congestive) and diastolic (congestive) heart failure: Secondary | ICD-10-CM | POA: Diagnosis not present

## 2015-05-28 DIAGNOSIS — I10 Essential (primary) hypertension: Secondary | ICD-10-CM | POA: Insufficient documentation

## 2015-05-28 DIAGNOSIS — R110 Nausea: Secondary | ICD-10-CM | POA: Insufficient documentation

## 2015-05-28 DIAGNOSIS — F17210 Nicotine dependence, cigarettes, uncomplicated: Secondary | ICD-10-CM | POA: Diagnosis not present

## 2015-05-28 DIAGNOSIS — E669 Obesity, unspecified: Secondary | ICD-10-CM | POA: Diagnosis not present

## 2015-05-28 DIAGNOSIS — R42 Dizziness and giddiness: Secondary | ICD-10-CM | POA: Insufficient documentation

## 2015-05-28 LAB — BASIC METABOLIC PANEL
ANION GAP: 11 (ref 5–15)
Anion gap: 15 (ref 5–15)
BUN: 16 mg/dL (ref 6–20)
BUN: 22 mg/dL — AB (ref 6–20)
CALCIUM: 9.4 mg/dL (ref 8.9–10.3)
CHLORIDE: 102 mmol/L (ref 101–111)
CO2: 25 mmol/L (ref 22–32)
CO2: 21 mmol/L — ABNORMAL LOW (ref 22–32)
CREATININE: 1.68 mg/dL — AB (ref 0.61–1.24)
Calcium: 9.2 mg/dL (ref 8.9–10.3)
Chloride: 100 mmol/L — ABNORMAL LOW (ref 101–111)
Creatinine, Ser: 1.22 mg/dL (ref 0.61–1.24)
GFR calc Af Amer: >60 mL/min (ref >60)
GFR calc non Af Amer: >60 mL/min (ref >60)
GFR, EST AFRICAN AMERICAN: 50 mL/min — AB (ref >60)
GFR, EST NON AFRICAN AMERICAN: 43 mL/min — AB (ref >60)
GLUCOSE: 141 mg/dL — AB (ref 65–99)
Glucose, Bld: 147 mg/dL — ABNORMAL HIGH (ref 65–99)
Potassium: 4.7 mmol/L (ref 3.5–5.1)
Potassium: 4.5 mmol/L (ref 3.5–5.1)
SODIUM: 138 mmol/L (ref 135–145)
Sodium: 136 mmol/L (ref 135–145)

## 2015-05-28 LAB — LIPID PANEL
CHOL/HDL RATIO: 5.2 ratio
Cholesterol: 140 mg/dL (ref 0–200)
HDL: 27 mg/dL — AB (ref >40)
LDL CALC: UNDETERMINED mg/dL (ref 0–99)
Triglycerides: 521 mg/dL — ABNORMAL HIGH (ref <150)
VLDL: UNDETERMINED mg/dL (ref 0–40)

## 2015-05-28 LAB — CBC
HCT: 46.8 % (ref 39.0–52.0)
Hemoglobin: 15.3 g/dL (ref 13.0–17.0)
MCH: 29.6 pg (ref 26.0–34.0)
MCHC: 32.7 g/dL (ref 30.0–36.0)
MCV: 90.5 fL (ref 78.0–100.0)
PLATELETS: 315 10*3/uL (ref 150–400)
RBC: 5.17 MIL/uL (ref 4.22–5.81)
RDW: 14.8 % (ref 11.5–15.5)
WBC: 9.7 10*3/uL (ref 4.0–10.5)

## 2015-05-28 LAB — CBG MONITORING, ED: GLUCOSE-CAPILLARY: 132 mg/dL — AB (ref 65–99)

## 2015-05-28 NOTE — ED Notes (Signed)
30 minutes ago felt sudden onset of dizziness and nausea. No LOC. Improved now, but still feels lightheaded.

## 2015-05-31 ENCOUNTER — Telehealth (HOSPITAL_COMMUNITY): Payer: Self-pay | Admitting: Pharmacist

## 2015-05-31 NOTE — Telephone Encounter (Signed)
Losartan 100 mg daily PA approved by [place] Medicaid from 05/18/15 through 05/12/16.   Tyler Deis. Bonnye Fava, PharmD, BCPS, CPP Clinical Pharmacist Pager: [phone] Phone: [phone] 05/31/2015 11:59 AM

## 2015-06-02 ENCOUNTER — Other Ambulatory Visit (HOSPITAL_COMMUNITY): Payer: Self-pay | Admitting: Cardiology

## 2015-06-02 MED ORDER — FENOFIBRATE 145 MG PO TABS: 145.0000 mg | ORAL_TABLET | Freq: Every day | ORAL | Status: AC

## 2015-06-09 ENCOUNTER — Encounter: Payer: Self-pay | Admitting: Internal Medicine

## 2015-06-09 ENCOUNTER — Other Ambulatory Visit: Payer: Self-pay | Admitting: Internal Medicine

## 2015-06-09 ENCOUNTER — Ambulatory Visit (INDEPENDENT_AMBULATORY_CARE_PROVIDER_SITE_OTHER): Payer: Medicaid Other | Admitting: Internal Medicine

## 2015-06-09 VITALS — BP 104/74 | HR 91 | Temp 98.1°F | Ht 67.0 in | Wt 266.0 lb

## 2015-06-09 DIAGNOSIS — B182 Chronic viral hepatitis C: Secondary | ICD-10-CM | POA: Diagnosis not present

## 2015-06-09 MED ORDER — ELBASVIR-GRAZOPREVIR 50-100 MG PO TABS: 1.0000 | ORAL_TABLET | Freq: Every day | ORAL | Status: DC

## 2015-06-09 NOTE — Telephone Encounter (Signed)
Last appt 5/15; next appt 8/14.

## 2015-06-09 NOTE — Progress Notes (Signed)
[facility]   CC: consideration for treatment for chronic hepatitis C  HPI:  +Ethan Wilcox is a 60 y.o. male who presents for initial evaluation and management of chronic hepatitis C.  Patient tested positive this year during routine screening. Hepatitis C-associated risk factors present are: none. Patient denies history of blood transfusion, IV drug abuse, renal dialysis, sexual contact with person with liver disease, tattoos. Patient has had other studies performed. Results: hepatitis C RNA by PCR, result: positive. Patient has not had prior treatment for Hepatitis C. Patient does not have a past history of liver disease. Patient does not have a family history of liver disease. Patient does not  have associated signs or symptoms related to liver disease.  Labs reviewed and confirm chronic hepatitis C with a positive viral load.  He does have a drug history but not injectable.  Not currently using. Drinks alcohol 3-4 times per week but not daily.  Records reviewed from Epic. Has significant heart failure.      Patient does have documented immunity to Hepatitis A. Patient does have documented immunity to Hepatitis B.    Review of Systems:  Constitutional: negative for fatigue and malaise Musculoskeletal: negative for myalgias and arthralgias All other systems reviewed and are negative      Past Medical History  Diagnosis Date  . Nonischemic cardiomyopathy (HCC)     admitted 5/11 w acute systolic CHF. LHC showed no angiographic CAD. Echo (5/11) showed EF 15-20% w diffuse sever hypokinesis, moderate MR> RHC showed PCWP 10, CI 1.47 (Fick.) cause of CMP thought to be heavy ETOH and cocaine. No ICD yet due to ongoing ETOH intake  . ETOH abuse   . Cocaine abuse   . Active smoker /    1/2 ppd  . Obesity   . Hypertension   . CHF (congestive heart failure) (HCC)   . Gout   . GERD (gastroesophageal reflux disease)   . Sleep apnea   . Hx of anaphylaxis with  angioedema 08/23/2012    Admitted with angioedema on 08/24/2012 No intubation Unknown allergen Shrimp Vs Spices Vs ACEis Stopped Lisinopril     Prior to Admission medications   Medication Sig Start Date End Date Taking? Authorizing Provider  albuterol (PROVENTIL HFA;VENTOLIN HFA) 108 (90 Base) MCG/ACT inhaler Inhale 2 puffs into the lungs every 4 (four) hours as needed for wheezing or shortness of breath. 03/12/15  Yes Lora Paula, MD  aspirin (ASPIR-LOW) 81 MG EC tablet Take 81 mg by mouth daily.    Yes Historical Provider, MD  carvedilol (COREG) 25 MG tablet Take 1 tablet (25 mg total) by mouth 2 (two) times daily. 02/10/15  Yes Alexa Lucrezia Starch, MD  diphenhydrAMINE (BENADRYL) 25 MG tablet Take 25 mg by mouth every 6 (six) hours as needed for allergies. Reported on 05/24/2015   Yes Historical Provider, MD  EPINEPHrine (EPIPEN) 0.3 mg/0.3 mL SOAJ injection Inject 0.3 mL (0.3 mg total) into the muscle once as needed (anaphylaxis). 08/24/12  Yes Leroy Sea, MD  fenofibrate (TRICOR) 145 MG tablet Take 1 tablet (145 mg total) by mouth daily. 06/02/15  Yes Dolores Patty, MD  furosemide (LASIX) 40 MG tablet Take 1 tablet (40 mg total) by mouth 2 (two) times daily. 04/14/15  Yes Marrian Salvage, MD  ibuprofen (ADVIL,MOTRIN) 600 MG tablet Reported on 05/24/2015 03/09/15  Yes Historical Provider, MD  losartan (COZAAR) 100 MG tablet Take 1 tablet (100 mg total) by mouth  daily. 05/14/15  Yes Laurey Morale, MD  Multiple Vitamin (MULTIVITAMIN WITH MINERALS) TABS Take 1 tablet by mouth daily.   Yes Historical Provider, MD  omeprazole (PRILOSEC) 40 MG capsule Take 1 capsule (40 mg total) by mouth daily. 02/10/15  Yes Alexa Lucrezia Starch, MD  polyethylene glycol powder (GLYCOLAX/MIRALAX) powder Take 17 g by mouth daily. DISSOLVE 1 CAPFUL BY MOUTH DAILY AS DIRECTED 02/10/15  Yes Alexa Lucrezia Starch, MD  spironolactone (ALDACTONE) 25 MG tablet Take 1 tablet (25 mg total) by mouth daily. 02/10/15  Yes Alexa Lucrezia Starch, MD    Elbasvir-Grazoprevir (ZEPATIER) 50-100 MG TABS Take 1 tablet by mouth daily. 06/09/15   Gardiner Barefoot, MD    Allergies  Allergen Reactions  . Lisinopril Anaphylaxis    Possible cause of anaphylaxis episode on 08/24/2012 which required admission.  . Shrimp [Shellfish Allergy] Anaphylaxis  . Penicillins Other (See Comments)    unknowN    Social History  Substance Use Topics  . Smoking status: Current Every Day Smoker -- 0.75 packs/day for 38 years    Types: Cigarettes    Start date: 01/10/1972  . Smokeless tobacco: None     Comment: down to .5 pk.  about 1 week ago.  Stopped 2.5 weeks ago  . Alcohol Use: 7.2 oz/week    12 Shots of liquor per week     Comment: Vodka 1/2 pint - on/off.    Family History  Problem Relation Age of Onset  . Heart disease Mother   . Cancer Mother   . Heart disease Father   no cirrhosis   Objective:  Constitutional: in no apparent distress and alert,  Filed Vitals:   06/09/15 1514  BP: 104/74  Pulse: 91  Temp: 98.1 F (36.7 C)   Eyes: anicteric Cardiovascular: Cor RRR and No murmurs Respiratory: CTA B; normal respiratory effort Gastrointestinal: Bowel sounds are normal, liver is not enlarged, spleen is not enlarged; morbidly obese Musculoskeletal: no pedal edema noted Skin: negatives: no rash; no porphyria cutanea tarda Lymphatic: no cervical lymphadenopathy   Laboratory Genotype: No results found for: HCVGENOTYPE HCV viral load: No results found for: HCVQUANT Lab Results  Component Value Date   WBC 9.7 05/28/2015   HGB 15.3 05/28/2015   HCT 46.8 05/28/2015   MCV 90.5 05/28/2015   PLT 315 05/28/2015    Lab Results  Component Value Date   CREATININE 1.68* 05/28/2015   BUN 22* 05/28/2015   NA 138 05/28/2015   K 4.5 05/28/2015   CL 102 05/28/2015   CO2 21* 05/28/2015    Lab Results  Component Value Date   ALT 24 03/22/2015   AST 19 03/22/2015   ALKPHOS 98 03/22/2015     Labs and history reviewed and show CHILD-PUGH  A  5-6 points: Child class A 7-9 points: Child class B 10-15 points: Child class C  Lab Results  Component Value Date   INR 0.98 04/29/2015   BILITOT 0.8 03/22/2015   ALBUMIN 3.1* 03/22/2015     Assessment: New Patient with Chronic Hepatitis C genotype 1b, untreated.  I discussed with the patient the lab findings that confirm chronic hepatitis C as well as the natural history and progression of disease including about 30% of people who develop cirrhosis of the liver if left untreated and once cirrhosis is established there is a 2-7% risk per year of liver cancer and liver failure.  I discussed the importance of treatment and benefits in reducing the risk, even if significant liver  fibrosis exists.   Plan: 1) Patient counseled extensively on limiting acetaminophen to no more than 2 grams daily, avoidance of alcohol. 2) Transmission discussed with patient including sexual transmission, sharing razors and toothbrush.   3) Will need referral to gastroenterology if concern for cirrhosis 4) Will need referral for substance abuse counseling: No.; Further work up to include urine drug screen  No. 5) Will prescribe Zepatier for 12 weeks or 16 weeks with ribavirin if any NS5A resistance found 6) Hepatitis A vaccine No. 7) Hepatitis B vaccine No. 8) Pneumovax vaccine if concern for cirrhosis 9) Further work up to include liver staging with elastography 10) NS5A test  No. 10) will follow up after starting medication

## 2015-06-09 NOTE — Patient Instructions (Signed)
Date 06/09/2015  Dear Ethan Wilcox, As discussed in the ID Clinic, your hepatitis C therapy will include the following medications:          Zepatier (elbasvir 50 mg/grazoprevir 100 mg) for 12 weeks   Please note that ALL MEDICATIONS WILL START ON THE SAME DATE for a total of 12 weeks. ---------------------------------------------------------------- Your HCV Treatment Start Date: TBA   Your HCV genotype:  1b    Liver Fibrosis: TBD    ---------------------------------------------------------------- YOUR PHARMACY CONTACT:   [name] [address] Phone: [phone] Hours: Monday to Friday 7:30 am to 6:00 pm   Please always contact your pharmacy at least 3-4 business days before you run out of medications to ensure your next month's medication is ready or 1 week prior to running out if you receive it by mail.  Remember, each prescription is for 28 days. ---------------------------------------------------------------- GENERAL NOTES REGARDING YOUR HEPATITIS C MEDICATION:  ZEPATIER is available as a beige-colored, oval-shaped, film-coated tablet debossed with "770" on one side and plain on the other. Each tablet contains 50 mg elbasvir and 100 mg grazoprevir.  Common side effects of ZEPATIER when used without ribavirin include: - feeling tired -trouble sleeping - headache -diarrhea - nausea  Common side effects of ZEPATIER when used with ribavirin include: - low red blood cell counts (anemia) - feeling irritable - headache - stomach pain - feeling tired - depression - shortness of breath - joint pain - rash or itching  Please note that this only lists the most common side effects and is NOT a comprehensive list of the potential side effects of these medications. For more information, please review the drug information sheets that come with your medication package from the pharmacy.   ---------------------------------------------------------------- GENERAL HELPFUL HINTS ON HCV THERAPY: 1. Stay well-hydrated. 2. Notify the ID Clinic of any changes in your other over-the-counter/herbal or prescription medications. 3. If you miss a dose of your medication, take the missed dose as soon as you remember. Return to your regular time/dose schedule the next day.  4.  Do not stop taking your medications without first talking with your healthcare provider. 5.  You may take Tylenol (acetaminophen), as long as the dose is less than 2000 mg (OR no more than 4 tablets of the Tylenol Extra Strengths 500mg  tablet) in 24 hours. 6.  You will see our pharmacist-specialist within the first 2 weeks of starting your medication. 7.  You will need to obtain routine labs around week 4 and12 weeks after starting and then 3 to 6 months after finishing Zepatier.   8.  If ribavirin is part of your regimen, you also will have a lab visit within 2 weeks.   Staci Righter, MD  [facility] [address] [place], [place]  [address] [phone]

## 2015-06-11 ENCOUNTER — Encounter (HOSPITAL_COMMUNITY): Payer: Self-pay | Admitting: Cardiology

## 2015-06-23 ENCOUNTER — Ambulatory Visit (HOSPITAL_COMMUNITY): Payer: Medicaid Other

## 2015-06-30 ENCOUNTER — Ambulatory Visit (HOSPITAL_COMMUNITY): Admission: RE | Admit: 2015-06-30 | Payer: Medicaid Other | Source: Ambulatory Visit

## 2015-07-27 ENCOUNTER — Telehealth: Payer: Self-pay | Admitting: *Deleted

## 2015-07-27 NOTE — Telephone Encounter (Signed)
Called the patient to let him know the appt has been canceled and the patient advised ok he did not know he had an appointment anyway. Advised we will call him when rescheduled.

## 2015-07-27 NOTE — Telephone Encounter (Signed)
Pre-cert department called to advise that the patient is scheduled for test on tomorrow 07/28/15 and it requires a PA. Advised will try to get started on it today but not sure will be able to get processed by end of day as it is 3 pm. We decided to cancel the appt and get auth then reschedule. Will call the patient to let him know.

## 2015-07-28 ENCOUNTER — Ambulatory Visit (HOSPITAL_COMMUNITY): Payer: Medicaid Other

## 2015-08-16 ENCOUNTER — Telehealth: Payer: Self-pay | Admitting: Internal Medicine

## 2015-08-16 MED ORDER — LOSARTAN POTASSIUM 100 MG PO TABS: 100.0000 mg | ORAL_TABLET | Freq: Every day | ORAL | 6 refills | Status: DC

## 2015-08-16 NOTE — Telephone Encounter (Signed)
Attempted to call patient to clarify as I cannot find any note that he is supposed to be taking losartan BID. No answer LVM to return call.

## 2015-08-16 NOTE — Telephone Encounter (Signed)
I do not think I have changed the losartan dose. He follows the heart failure team so perhaps they have changed it- but I do not see the documentation that they changed it. Pl advise the pt to continue losartan 100 mg daily.   Thanks

## 2015-08-16 NOTE — Telephone Encounter (Signed)
Called pt, he said dr Johnny Bridge told him to take 2 a day at his last appt, reviewed the visit, did not see where the losartan was changed he then said maybe it was dr Erlinda Hong clean's office that told him that, he advised to call dr Kathyrn Lass office and speak with them about that, he is reminded to come to visit next week

## 2015-08-16 NOTE — Telephone Encounter (Signed)
Needs refill of losartan [facility]. Says he was told to chagne it to twice a day so he ran out early

## 2015-08-23 ENCOUNTER — Ambulatory Visit (INDEPENDENT_AMBULATORY_CARE_PROVIDER_SITE_OTHER): Payer: Medicaid Other | Admitting: Internal Medicine

## 2015-08-23 ENCOUNTER — Encounter: Payer: Self-pay | Admitting: Internal Medicine

## 2015-08-23 ENCOUNTER — Telehealth: Payer: Self-pay | Admitting: *Deleted

## 2015-08-23 DIAGNOSIS — B182 Chronic viral hepatitis C: Secondary | ICD-10-CM | POA: Diagnosis not present

## 2015-08-23 DIAGNOSIS — Z79899 Other long term (current) drug therapy: Secondary | ICD-10-CM | POA: Diagnosis not present

## 2015-08-23 DIAGNOSIS — I110 Hypertensive heart disease with heart failure: Secondary | ICD-10-CM

## 2015-08-23 DIAGNOSIS — R3129 Other microscopic hematuria: Secondary | ICD-10-CM

## 2015-08-23 DIAGNOSIS — I10 Essential (primary) hypertension: Secondary | ICD-10-CM

## 2015-08-23 DIAGNOSIS — G4733 Obstructive sleep apnea (adult) (pediatric): Secondary | ICD-10-CM

## 2015-08-23 DIAGNOSIS — I5022 Chronic systolic (congestive) heart failure: Secondary | ICD-10-CM

## 2015-08-23 DIAGNOSIS — Z299 Encounter for prophylactic measures, unspecified: Secondary | ICD-10-CM

## 2015-08-23 DIAGNOSIS — I5042 Chronic combined systolic (congestive) and diastolic (congestive) heart failure: Secondary | ICD-10-CM

## 2015-08-23 NOTE — Assessment & Plan Note (Signed)
Ordered UA

## 2015-08-23 NOTE — Telephone Encounter (Signed)
Patient notified of appt for ultrasound at [facility] on 09/07/15 at 6:45 AM. Nothing to eat or drink after midnight. Authorization # [id number]

## 2015-08-23 NOTE — Progress Notes (Signed)
    CC: HTN, combined CHF, OSA, microscopic hematuria, HCV, HM HPI: [name] is a 60 y.o. man with PMH noted below here for HTN, combined CHF, OSA, microscopic hematuria, HCV, HM  Please see Problem List/A&P for the status of the patient's chronic medical problems   Past Medical History:  Diagnosis Date  . Active smoker /   1/2 ppd  . CHF (congestive heart failure) (HCC)   . Cocaine abuse   . ETOH abuse   . GERD (gastroesophageal reflux disease)   . Gout   . Hx of anaphylaxis with angioedema 08/23/2012   Admitted with angioedema on 08/24/2012 No intubation Unknown allergen Shrimp Vs Spices Vs ACEis Stopped Lisinopril   . Hypertension   . Nonischemic cardiomyopathy (HCC)    admitted 5/11 w acute systolic CHF. LHC showed no angiographic CAD. Echo (5/11) showed EF 15-20% w diffuse sever hypokinesis, moderate MR> RHC showed PCWP 10, CI 1.47 (Fick.) cause of CMP thought to be heavy ETOH and cocaine. No ICD yet due to ongoing ETOH intake  . Obesity   . Sleep apnea     Review of Systems: Denies fevers, chills fatigue Denies cough, has some SOB both at rest and exertion. Has gained few pounds but now has a scale at home Denies n/v/d/ , and has occasional stomach cramps   Physical Exam: There were no vitals filed for this visit.  General: A&O, in NAD, obese CV: RRR, normal s1, s2, no m/r/g Resp: equal and symmetric breath sounds, no wheezing or crackles  Abdomen: soft, nontender, nondistended, +BS Extremities: pulses intact b/l, no edema   Assessment & Plan:   See encounters tab for problem based medical decision making. Patient discussed with Dr. Heide Wilcox

## 2015-08-23 NOTE — Assessment & Plan Note (Signed)
-  Pt willing to do colonoscopy -ordered colonoscopy

## 2015-08-23 NOTE — Assessment & Plan Note (Signed)
BP Readings from Last 3 Encounters:  06/09/15 104/74  05/28/15 107/63  05/24/15 126/78   BP today is 118/81, and is well controlled on coreg 25 mg bid, losartan, spironolactone, and lasix.  Plan -continue above medications -follow up with the heart failure team for his combined CHF

## 2015-08-23 NOTE — Telephone Encounter (Signed)
Patient wondering when he can start Hep C medication.  Medicaid requires the patient to have an Elastography to document level of Hep C in order for prescription to be filled.  Elastography appointment was cancelled.  Patient needs new Elastography appointment through Medicaid.  Will forward to J. Jennye Moccasin, CMA to process.

## 2015-08-23 NOTE — Assessment & Plan Note (Signed)
Reordered sleep study

## 2015-08-23 NOTE — Assessment & Plan Note (Signed)
Patient was diagnosed with hepatitis C infection as Ab was positive, and viral load was >100,000. He saw Dr Luciana Axe on 5/31, but has not done the [place] elastography so has not yet begun Rx.  -Reminded him his [place] appt on 8/29. He is to follow up with ID following the ultrasound to begin Rx

## 2015-08-23 NOTE — Patient Instructions (Addendum)
Thank you for your visit today  Your ultrasound of the liver is scheduled for August 29th in the morning. Please DO NOT MISS your appointment again.  Please follow up with Dr. Luciana Axe once you do the ultrasound.  Please make an appointment to follow up with the heart doctor about your heart failure as they may need to adjust some of the medications  Please do the colonoscopy and I have placed a referral for you.  Also, please do the sleep study  Please follow up in 6 months

## 2015-08-24 LAB — URINALYSIS, ROUTINE W REFLEX MICROSCOPIC
Bilirubin, UA: NEGATIVE
GLUCOSE, UA: NEGATIVE
Ketones, UA: NEGATIVE
Leukocytes, UA: NEGATIVE
Nitrite, UA: NEGATIVE
PROTEIN UA: NEGATIVE
RBC, UA: NEGATIVE
Specific Gravity, UA: 1.024 (ref 1.005–1.030)
Urobilinogen, Ur: 0.2 mg/dL (ref 0.2–1.0)
pH, UA: 6 (ref 5.0–7.5)

## 2015-08-25 NOTE — Telephone Encounter (Signed)
Prior auth obtained and ultrasound scheduled; patient aware.

## 2015-08-30 NOTE — Progress Notes (Signed)
Internal Medicine Clinic Attending  Case discussed with Dr. Saraiya at the time of the visit.  We reviewed the resident's history and exam and pertinent patient test results.  I agree with the assessment, diagnosis, and plan of care documented in the resident's note.  

## 2015-09-01 ENCOUNTER — Telehealth: Payer: Self-pay | Admitting: Gastroenterology

## 2015-09-01 NOTE — Telephone Encounter (Signed)
Received colon and path results and placed on Dr. Larae Grooms desk for review.

## 2015-09-07 ENCOUNTER — Ambulatory Visit (HOSPITAL_COMMUNITY)
Admission: RE | Admit: 2015-09-07 | Discharge: 2015-09-07 | Disposition: A | Discharge status: Home / Self Care | Payer: Medicaid Other | Source: Ambulatory Visit | Attending: Internal Medicine | Admitting: Internal Medicine

## 2015-09-07 DIAGNOSIS — R932 Abnormal findings on diagnostic imaging of liver and biliary tract: Secondary | ICD-10-CM | POA: Diagnosis not present

## 2015-09-07 DIAGNOSIS — B182 Chronic viral hepatitis C: Secondary | ICD-10-CM | POA: Diagnosis not present

## 2015-09-07 NOTE — Telephone Encounter (Signed)
LM ON VMAIL TO CB TO SCHEDULE COLON

## 2015-09-14 MED FILL — ZEPATIER 50-100 MG TABLET: 50-100 | 28 days supply | Qty: 28 | Fill #0

## 2015-09-16 ENCOUNTER — Encounter: Payer: Self-pay | Admitting: Pharmacy Technician

## 2015-09-23 ENCOUNTER — Telehealth: Payer: Self-pay | Admitting: Pharmacist

## 2015-09-23 NOTE — Telephone Encounter (Signed)
Thank you :)

## 2015-09-23 NOTE — Telephone Encounter (Signed)
Contacted patient to offer medication review. Patient refused. Advised patient to contact clinic if medication questions/concerns arise.

## 2015-09-28 ENCOUNTER — Telehealth (HOSPITAL_COMMUNITY): Payer: Self-pay | Admitting: Vascular Surgery

## 2015-09-28 NOTE — Telephone Encounter (Signed)
Patient has not returned our phone call to schedule colonoscopy. Records will be in "records reviewed" folder.

## 2015-09-28 NOTE — Telephone Encounter (Signed)
Returned pt call to make f/u appt w/ Mclean 

## 2015-10-06 ENCOUNTER — Ambulatory Visit: Payer: Medicaid Other

## 2015-10-06 ENCOUNTER — Ambulatory Visit: Payer: Medicaid Other | Admitting: Pharmacist

## 2015-10-06 DIAGNOSIS — B182 Chronic viral hepatitis C: Secondary | ICD-10-CM

## 2015-10-06 MED FILL — ZEPATIER 50-100 MG TABLET: 50-100 | 28 days supply | Qty: 28 | Fill #1

## 2015-10-06 NOTE — Progress Notes (Signed)
HPI: Ethan Wilcox is a 60 y.o. male who presents to the [facility] pharmacy clinic today for follow-up of his Hep C. He has genotype 1b and started Zepatier on 09/16/15.  Lab Results  Component Value Date   HEPCGENOTYPE 1b 03/31/2015    Allergies: Allergies  Allergen Reactions  . Lisinopril Anaphylaxis    Possible cause of anaphylaxis episode on 08/24/2012 which required admission.  . Shrimp [Shellfish Allergy] Anaphylaxis  . Penicillins Other (See Comments)    unknowN    Past Medical History: Past Medical History:  Diagnosis Date  . Active smoker /   1/2 ppd  . CHF (congestive heart failure) (HCC)   . Cocaine abuse   . ETOH abuse   . GERD (gastroesophageal reflux disease)   . Gout   . Hx of anaphylaxis with angioedema 08/23/2012   Admitted with angioedema on 08/24/2012 No intubation Unknown allergen Shrimp Vs Spices Vs ACEis Stopped Lisinopril   . Hypertension   . Nonischemic cardiomyopathy (HCC)    admitted 5/11 w acute systolic CHF. LHC showed no angiographic CAD. Echo (5/11) showed EF 15-20% w diffuse sever hypokinesis, moderate MR> RHC showed PCWP 10, CI 1.47 (Fick.) cause of CMP thought to be heavy ETOH and cocaine. No ICD yet due to ongoing ETOH intake  . Obesity   . Sleep apnea     Social History: Social History   Social History  . Marital status: Single    Spouse name: N/A  . Number of children: N/A  . Years of education: N/A   Occupational History  . DISABLED Unemployed   Social History Main Topics  . Smoking status: Current Every Day Smoker    Packs/day: 0.75    Years: 38.00    Types: Cigarettes    Start date: 01/10/1972  . Smokeless tobacco: Not on file     Comment: down to .5 pk.  about 1 week ago.  Stopped 2.5 weeks ago  . Alcohol use 7.2 oz/week    12 Shots of liquor per week     Comment: Vodka 1/2 pint - on/off.  . Drug use: No     Comment: none in over 20 yrs per patient  . Sexual activity: Not Currently   Other Topics Concern  . Not on file    Social History Narrative   Works as Gafferhandyman. 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 Heavy ETOH in past, now back to drinking 1/2 pint/week.          No show 07/16/09    Labs: Hep B S Ab (no units)  Date Value  04/29/2015 POS (A)   Hepatitis B Surface Ag (no units)  Date Value  04/29/2015 NEGATIVE    Lab Results  Component Value Date   HEPCGENOTYPE 1b 03/31/2015    No flowsheet data found.  AST (U/L)  Date Value  03/22/2015 19  07/21/2014 24  08/23/2012 22   ALT (U/L)  Date Value  03/22/2015 24  07/21/2014 31  08/23/2012 32   INR (no units)  Date Value  04/29/2015 0.98  05/17/2009 0.98    CrCl: CrCl cannot be calculated (Unknown ideal weight.).  Fibrosis Score: F3/F4 as assessed by ultrasound   Child-Pugh Score: A  Previous Treatment Regimen: None  Assessment: Ethan Wilcox is here for follow-up of his Hep C.  He has been taking Zepatier without any issues. He is having no side effects or problems.  He tells me he has not missed any doses, and I explained the importance of  not missing any doses and being adherent. He understands.  I went over his medications with him and made sure we had an up to date list in epic.  We will get a Hep C viral load today, and I will make a f/u appt with Dr. Luciana Axe for end of treatment.   Plans: - Continue Zepatier x 12 weeks - F/u with Dr. Luciana Axe 12/12 9:45am  Marin Roberts, PharmD Infectious Diseases Clinical Pharmacist [facility] 10/06/2015, 12:06 PM

## 2015-10-08 LAB — HEPATITIS C RNA QUANTITATIVE: HCV Quantitative: NOT DETECTED IU/mL (ref <15)

## 2015-10-11 NOTE — Addendum Note (Signed)
Addended by: Bufford Spikes on: 10/11/2015 03:22 PM   Modules accepted: Orders

## 2015-10-12 ENCOUNTER — Encounter (HOSPITAL_BASED_OUTPATIENT_CLINIC_OR_DEPARTMENT_OTHER): Payer: Medicaid Other

## 2015-10-25 ENCOUNTER — Telehealth: Payer: Self-pay | Admitting: *Deleted

## 2015-10-25 DIAGNOSIS — K219 Gastro-esophageal reflux disease without esophagitis: Secondary | ICD-10-CM

## 2015-10-25 MED ORDER — OMEPRAZOLE 40 MG PO CPDR: 40.0000 mg | DELAYED_RELEASE_CAPSULE | Freq: Every day | ORAL | 0 refills | Status: DC

## 2015-10-25 NOTE — Telephone Encounter (Signed)
Refilled for 3 months with no refills.  Needs reassessment as to need for this dose or would a change to H2 blockers be more appropriate if pharmacotherapy is required.  Will ask Dr. Johnny Bridge to address and document at his next visit.

## 2015-10-27 ENCOUNTER — Telehealth: Payer: Self-pay | Admitting: *Deleted

## 2015-10-27 NOTE — Telephone Encounter (Signed)
Call to [facility] Tracks for Prior Authorization for Omeprazole 40 mg tablets.  PA was denied as plan will cover only for 30 days and not 90 days.  Will be run as a 30 days. [id number]  Contact number. Hazael Olveda. RN 10/27/2015 11:32 AM

## 2015-11-02 ENCOUNTER — Other Ambulatory Visit: Payer: Self-pay | Admitting: Internal Medicine

## 2015-11-02 MED FILL — ZEPATIER 50-100 MG TABLET: 50-100 | 28 days supply | Qty: 28 | Fill #2

## 2015-11-19 ENCOUNTER — Other Ambulatory Visit: Payer: Self-pay | Admitting: Internal Medicine

## 2015-12-21 ENCOUNTER — Encounter: Payer: Self-pay | Admitting: Internal Medicine

## 2015-12-21 ENCOUNTER — Ambulatory Visit (INDEPENDENT_AMBULATORY_CARE_PROVIDER_SITE_OTHER): Payer: Medicaid Other | Admitting: Internal Medicine

## 2015-12-21 VITALS — BP 103/74 | HR 111 | Temp 98.0°F | Wt 291.4 lb

## 2015-12-21 DIAGNOSIS — K7400 Hepatic fibrosis, unspecified: Secondary | ICD-10-CM | POA: Insufficient documentation

## 2015-12-21 DIAGNOSIS — K7460 Unspecified cirrhosis of liver: Secondary | ICD-10-CM

## 2015-12-21 DIAGNOSIS — B182 Chronic viral hepatitis C: Secondary | ICD-10-CM | POA: Diagnosis present

## 2015-12-21 DIAGNOSIS — F1010 Alcohol abuse, uncomplicated: Secondary | ICD-10-CM | POA: Diagnosis not present

## 2015-12-21 NOTE — Progress Notes (Signed)
   Subjective:    Patient ID: Ethan Wilcox, male    DOB: 17-Mar-1955, 60 y.o.   MRN: [id number]  HPI Here for follow up of HCV.  Has genotype 1b, viral load 1,000,000 and started and now completed 12 weeks of Zepatier.  No issues.  Elastography with F3/4, nl platelets.  Ultrasound with parenchymal disease.  Referred to [facility] for screening colonoscopy and appt in February.  Does drink alcohol about every other day.     Review of Systems  Constitutional: Negative for fatigue.  Gastrointestinal: Negative for diarrhea.  Skin: Negative for rash.  Neurological: Negative for dizziness and headaches.       Objective:   Physical Exam  Constitutional: He appears well-developed and well-nourished.  Eyes: No scleral icterus.  Cardiovascular: Normal rate, regular rhythm and normal heart sounds.   Pulmonary/Chest: Effort normal and breath sounds normal.  Abdominal:  Morbidly obese  Skin: No rash noted.   Social History   Social History  . Marital status: Single    Spouse name: N/A  . Number of children: N/A  . Years of education: N/A   Occupational History  . DISABLED Unemployed   Social History Main Topics  . Smoking status: Current Every Day Smoker    Packs/day: 0.75    Years: 38.00    Types: Cigarettes    Start date: 01/10/1972  . Smokeless tobacco: Not on file     Comment: down to .5 pk.  about 1 week ago.  Stopped 2.5 weeks ago  . Alcohol use 7.2 oz/week    12 Shots of liquor per week     Comment: Vodka 1/2 pint - on/off.  . Drug use: No     Comment: none in over 20 yrs per patient  . Sexual activity: Not Currently   Other Topics Concern  . Not on file   Social History Narrative   Works as Gaffer. 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 Heavy ETOH in past, now back to drinking 1/2 pint/week.          No show 07/16/09         Assessment & Plan:

## 2015-12-21 NOTE — Assessment & Plan Note (Signed)
I advised him to quit alcohol completely.  He is seeing [facility] in Feb and I will alert them of this in case they also want to do EGD.  He will need limited abdominal ultrasound every 6 months for [facility] screen and will defer this to his PCP.

## 2015-12-21 NOTE — Assessment & Plan Note (Signed)
As above, advised to completely quit to reduce risk of cirrhosis/cancer.

## 2015-12-21 NOTE — Assessment & Plan Note (Signed)
Will do EOT lab today and rtc 5-6 months for SVR 24 with PharmD.

## 2015-12-23 LAB — HEPATITIS C RNA QUANTITATIVE: HCV Quantitative: NOT DETECTED IU/mL (ref <15)

## 2016-01-14 ENCOUNTER — Ambulatory Visit (HOSPITAL_BASED_OUTPATIENT_CLINIC_OR_DEPARTMENT_OTHER): Payer: Medicaid Other | Attending: Internal Medicine | Admitting: Internal Medicine

## 2016-01-14 VITALS — Ht 67.0 in | Wt 292.0 lb

## 2016-01-14 DIAGNOSIS — G4733 Obstructive sleep apnea (adult) (pediatric): Secondary | ICD-10-CM | POA: Diagnosis present

## 2016-01-19 ENCOUNTER — Other Ambulatory Visit: Payer: Self-pay | Admitting: Surgery

## 2016-01-23 DIAGNOSIS — G4733 Obstructive sleep apnea (adult) (pediatric): Secondary | ICD-10-CM | POA: Diagnosis not present

## 2016-01-23 NOTE — Procedures (Signed)
Patient Name: Ethan Wilcox, Ethan Wilcox Date: 01/14/2016 Gender: Male D.O.B: [birth year] Age (years): 60 Referring Provider: Gilles Chiquito Height (inches): 76 Interpreting Physician: Baird Lyons MD, ABSM Weight (lbs): 292 RPSGT: Baxter Flattery BMI: 46 MRN: [id number] Neck Size: 21.00 CLINICAL INFORMATION Sleep Study Type: Split Night CPAP  Indication for sleep study: Congestive Heart Failure, Fatigue, OSA, Snoring, Witnessed Apneas  Epworth Sleepiness Score: 9  SLEEP STUDY TECHNIQUE As per the AASM Manual for the Scoring of Sleep and Associated Events v2.3 (April 2016) with a hypopnea requiring 4% desaturations.  The channels recorded and monitored were frontal, central and occipital EEG, electrooculogram (EOG), submentalis EMG (chin), nasal and oral airflow, thoracic and abdominal wall motion, anterior tibialis EMG, snore microphone, electrocardiogram, and pulse oximetry. Continuous positive airway pressure (CPAP) was initiated when the patient met split night criteria and was titrated according to treat sleep-disordered breathing.  MEDICATIONS Medications self-administered by patient taken the night of the study : none reported  RESPIRATORY PARAMETERS Diagnostic  Total AHI (/hr): 84.3 RDI (/hr): 84.3 OA Index (/hr): 83.4 CA Index (/hr): 0.0 REM AHI (/hr): 67.5 NREM AHI (/hr): 85.4 Supine AHI (/hr): N/A Non-supine AHI (/hr): 84.34 Min O2 Sat (%): 66.00 Mean O2 (%): 89.17 Time below 88% (min): 55.0   Titration  Optimal Pressure (cm): 18 AHI at Optimal Pressure (/hr): 0.0 Min O2 at Optimal Pressure (%): 90.0 Supine % at Optimal (%): 0 Sleep % at Optimal (%): 99   SLEEP ARCHITECTURE The recording time for the entire night was 363.5 minutes.  During a baseline period of 150.8 minutes, the patient slept for 131.6 minutes in REM and nonREM, yielding a sleep efficiency of 87.3%. Sleep onset after lights out was 3.2 minutes with a REM latency of 124.0 minutes. The patient spent 6.84%  of the night in stage N1 sleep, 87.08% in stage N2 sleep, 0.00% in stage N3 and 6.08% in REM.  During the titration period of 200.1 minutes, the patient slept for 185.0 minutes in REM and nonREM, yielding a sleep efficiency of 92.4%. Sleep onset after CPAP initiation was 3.3 minutes with a REM latency of 25.0 minutes. The patient spent 1.89% of the night in stage N1 sleep, 58.92% in stage N2 sleep, 0.00% in stage N3 and 39.19% in REM.  CARDIAC DATA The 2 lead EKG demonstrated sinus rhythm. The mean heart rate was 95.78 beats per minute. Other EKG findings include: None. LEG MOVEMENT DATA The total Periodic Limb Movements of Sleep (PLMS) were 214. The PLMS index was 40.50 .  IMPRESSIONS - Severe obstructive sleep apnea occurred during the diagnostic portion of the study (AHI = 84.3/hour). An optimal PAP pressure was selected for this patient ( 18 cm of water) - No significant central sleep apnea occurred during the diagnostic portion of the study (CAI = 0.0/hour). - Severe oxygen desaturation was noted during the diagnostic portion of the study (Min O2 = 66.00%). - No snoring was audible during the diagnostic portion of the study. - No cardiac abnormalities were noted during this study. - Severe periodic limb movements of sleep occurred during the study.  DIAGNOSIS - Obstructive Sleep Apnea (327.23 [G47.33 ICD-10])  RECOMMENDATIONS - Trial of CPAP therapy on 18 cm H2O with a Large size Resmed Full Face Mask AirFit F20 mask and heated humidification. - Avoid alcohol, sedatives and other CNS depressants that may worsen sleep apnea and disrupt normal sleep architecture. - Sleep hygiene should be reviewed to assess factors that may improve sleep quality. - Weight management and regular  exercise should be initiated or continued. - Reconsider limb movement sleep disturbance after successful therapy for obstructive sleep apnea to see if this is an independent problem unrelated to respiratory  arousals.  [Electronically signed] 01/23/2016 03:24 PM  Baird Lyons MD, [place], American Board of Sleep Medicine   NPI: [id number]  [place], [facility]  ELECTRONICALLY SIGNED ON:  01/23/2016, 3:22 PM [place] PH: [phone]   FX: [phone] [place]

## 2016-01-31 ENCOUNTER — Other Ambulatory Visit: Payer: Self-pay | Admitting: Internal Medicine

## 2016-01-31 DIAGNOSIS — K219 Gastro-esophageal reflux disease without esophagitis: Secondary | ICD-10-CM

## 2016-01-31 NOTE — Telephone Encounter (Signed)
Needs reassessment of symptoms in order to continue omeprazole giving 30 day supply only May switch to H2 blocker

## 2016-01-31 NOTE — Telephone Encounter (Signed)
appt 02/21/16 w/pcp

## 2016-02-11 ENCOUNTER — Other Ambulatory Visit: Payer: Self-pay | Admitting: Internal Medicine

## 2016-02-11 DIAGNOSIS — I5043 Acute on chronic combined systolic (congestive) and diastolic (congestive) heart failure: Secondary | ICD-10-CM

## 2016-02-11 NOTE — Telephone Encounter (Signed)
appt 02/21/2016 with pcp.Kingsley Spittle Cassady2/2/20188:43 AM

## 2016-02-14 NOTE — Telephone Encounter (Signed)
pt is supposed to be on furosemide 40 bid Address at appt

## 2016-02-15 ENCOUNTER — Other Ambulatory Visit: Payer: Self-pay | Admitting: Internal Medicine

## 2016-02-15 NOTE — Telephone Encounter (Signed)
Pt has not been seen in 6 mo and needs appt

## 2016-02-21 ENCOUNTER — Encounter: Payer: Medicaid Other | Admitting: Internal Medicine

## 2016-02-23 ENCOUNTER — Other Ambulatory Visit: Payer: Self-pay | Admitting: Internal Medicine

## 2016-03-02 ENCOUNTER — Other Ambulatory Visit: Payer: Self-pay | Admitting: Internal Medicine

## 2016-03-02 DIAGNOSIS — K219 Gastro-esophageal reflux disease without esophagitis: Secondary | ICD-10-CM

## 2016-03-24 ENCOUNTER — Other Ambulatory Visit: Payer: Self-pay | Admitting: Internal Medicine

## 2016-03-24 ENCOUNTER — Telehealth: Payer: Self-pay | Admitting: Internal Medicine

## 2016-03-24 DIAGNOSIS — K219 Gastro-esophageal reflux disease without esophagitis: Secondary | ICD-10-CM

## 2016-03-24 NOTE — Telephone Encounter (Signed)
APT. REMINDER CALL, LMTCB °

## 2016-03-27 ENCOUNTER — Ambulatory Visit: Payer: Medicaid Other

## 2016-03-27 ENCOUNTER — Encounter: Payer: Medicaid Other | Admitting: Internal Medicine

## 2016-03-28 ENCOUNTER — Other Ambulatory Visit: Payer: Self-pay

## 2016-03-29 MED ORDER — LOSARTAN POTASSIUM 100 MG PO TABS: 100.0000 mg | ORAL_TABLET | Freq: Every day | ORAL | 0 refills | Status: DC

## 2016-03-29 MED FILL — LOSARTAN POTASSIUM 100 MG T: 100 | 90 days supply | Qty: 90 | Fill #0

## 2016-03-29 NOTE — Telephone Encounter (Signed)
Pt needs appt

## 2016-03-29 NOTE — Telephone Encounter (Signed)
Has not been seen in 6 months, and needs to discuss further use of omeprazole. Giving 1 month refills for both.

## 2016-03-30 NOTE — Telephone Encounter (Signed)
Message sent to front office to schedule pt an appt. 

## 2016-04-20 NOTE — Telephone Encounter (Signed)
Helen can you please close this enounter °

## 2016-05-01 ENCOUNTER — Ambulatory Visit (INDEPENDENT_AMBULATORY_CARE_PROVIDER_SITE_OTHER): Payer: Medicaid Other | Admitting: Internal Medicine

## 2016-05-01 ENCOUNTER — Encounter: Payer: Medicaid Other | Admitting: Internal Medicine

## 2016-05-01 VITALS — BP 123/70 | HR 57 | Temp 97.9°F | Ht 67.0 in | Wt 291.6 lb

## 2016-05-01 DIAGNOSIS — F17210 Nicotine dependence, cigarettes, uncomplicated: Secondary | ICD-10-CM | POA: Diagnosis not present

## 2016-05-01 DIAGNOSIS — Z6841 Body Mass Index (BMI) 40.0 and over, adult: Secondary | ICD-10-CM | POA: Diagnosis not present

## 2016-05-01 DIAGNOSIS — R3129 Other microscopic hematuria: Secondary | ICD-10-CM

## 2016-05-01 DIAGNOSIS — K219 Gastro-esophageal reflux disease without esophagitis: Secondary | ICD-10-CM | POA: Diagnosis not present

## 2016-05-01 DIAGNOSIS — N182 Chronic kidney disease, stage 2 (mild): Secondary | ICD-10-CM

## 2016-05-01 DIAGNOSIS — R5383 Other fatigue: Secondary | ICD-10-CM

## 2016-05-01 DIAGNOSIS — I10 Essential (primary) hypertension: Secondary | ICD-10-CM

## 2016-05-01 DIAGNOSIS — Z79899 Other long term (current) drug therapy: Secondary | ICD-10-CM

## 2016-05-01 DIAGNOSIS — F17200 Nicotine dependence, unspecified, uncomplicated: Secondary | ICD-10-CM

## 2016-05-01 DIAGNOSIS — G4733 Obstructive sleep apnea (adult) (pediatric): Secondary | ICD-10-CM | POA: Diagnosis not present

## 2016-05-01 DIAGNOSIS — Z0000 Encounter for general adult medical examination without abnormal findings: Secondary | ICD-10-CM

## 2016-05-01 DIAGNOSIS — I129 Hypertensive chronic kidney disease with stage 1 through stage 4 chronic kidney disease, or unspecified chronic kidney disease: Secondary | ICD-10-CM

## 2016-05-01 DIAGNOSIS — R7989 Other specified abnormal findings of blood chemistry: Secondary | ICD-10-CM | POA: Insufficient documentation

## 2016-05-01 MED ORDER — POLYETHYLENE GLYCOL 3350 17 GM/SCOOP PO POWD: ORAL | 4 refills | Status: DC

## 2016-05-01 MED ORDER — OMEPRAZOLE 40 MG PO CPDR: DELAYED_RELEASE_CAPSULE | ORAL | 0 refills | Status: DC

## 2016-05-01 MED FILL — POLYETHYLENE GLYCOL 3350: 15 days supply | Qty: 255 | Fill #0

## 2016-05-01 MED FILL — OMEPRAZOLE DR 40 MG CAPSULE: 40 | 90 days supply | Qty: 90 | Fill #0

## 2016-05-01 NOTE — Assessment & Plan Note (Addendum)
Patient's creatinine on most recent BMET was elevated with GFR of 50 which puts him at CKD stage 3 if going byjust that number.  Plan -repeat BMET to recheck Cr. If cr remains persistently elevated then patient likely has worsening of his renal function and CKD 2-3.  Addendum: Creatinine has normalized,

## 2016-05-01 NOTE — Assessment & Plan Note (Signed)
Patient has heartburn and occasional regurgitation of food for about 10 years. He says his symptoms are well controlled on the omeprazole.  Plan -continue omeprazole.

## 2016-05-01 NOTE — Assessment & Plan Note (Addendum)
Patient has gained about 30-40 lbs over last year. He denies any changes to the diet but does not exercise at all. He feels more fatigued.  Plan -encouraged more exercise. -check TSH  Addendum: TSH WNL

## 2016-05-01 NOTE — Patient Instructions (Addendum)
Thank you for your visit today  Please follow up with the heart doctor and also the gastroenterologist (GI doctor)- you will need a colonoscopy and they may need to look down your esophagus (food pipe)  Please continue to take your current medicines as prescribed  Please wear the CPAP  I will call you about the lab results   Please follow up in 2-3 months

## 2016-05-01 NOTE — Assessment & Plan Note (Signed)
Patient continues to smoke 1 ppd for about 20-30 years. He does not desire any intervention currently  Plan -address at each visit

## 2016-05-01 NOTE — Assessment & Plan Note (Signed)
Plan -referred to GI for colonoscopy.

## 2016-05-01 NOTE — Assessment & Plan Note (Signed)
Patient had a repeat split night study done which showed AHI of 84 with elevated REM AHI.  He has not received the CPAP yet but has not followed up with them  Plan -encouraged to wear CPAP and explained the sleep study results

## 2016-05-01 NOTE — Assessment & Plan Note (Addendum)
On March 2017, Patient's UA had shown 6-30 RBC, and then later a repeat UA in August 2017 showed no RBCs.  Plan -repeat UA ordered today. Further work up depending on the results .  Addendum: Repeat UA with microscopic analysis does not show presence of RBC > 2/hpf. Therefore, will not pursue further workup at this point.   I left voicemail at patient's phone number as we discussed and informed him of his results

## 2016-05-01 NOTE — Progress Notes (Signed)
    CC: HTN, OSA, microscopic hematuria, HCV, GERD, HM, smoking, fatigue , alcohol use HPI: [name] is a 61 y.o. man with PMH listed below here for HTN, OSA, microscopic hematuria, HCV, GERD, HM, smoking, fatigue , alcohol use   Please see Problem List/A&P for the status of the patient's chronic medical problems   Past Medical History:  Diagnosis Date  . Active smoker /   1/2 ppd  . CHF (congestive heart failure) (HCC)   . Cocaine abuse   . ETOH abuse   . GERD (gastroesophageal reflux disease)   . Gout   . Hx of anaphylaxis with angioedema 08/23/2012   Admitted with angioedema on 08/24/2012 No intubation Unknown allergen Shrimp Vs Spices Vs ACEis Stopped Lisinopril   . Hypertension   . Nonischemic cardiomyopathy (HCC)    admitted 5/11 w acute systolic CHF. LHC showed no angiographic CAD. Echo (5/11) showed EF 15-20% w diffuse sever hypokinesis, moderate MR> RHC showed PCWP 10, CI 1.47 (Fick.) cause of CMP thought to be heavy ETOH and cocaine. No ICD yet due to ongoing ETOH intake  . Obesity   . Sleep apnea     Review of Systems: Denies fevers, chills , has had about 30-40 lbs weight gain over past year. Has been more fatigued. Denies cough, SOB, wheezing Denies orthopnea or PND. Denies leg swelling. Has som eheartburn, sometimes nausea. No vomiting. No diarrhea or constipation.  Denies any gross hematuria. Denies dysuria or other urinary symptoms. Has some tingling in extremities.   Physical Exam: Vitals:   05/01/16 1515  BP: 123/70  Pulse: (!) 57  Temp: 97.9 F (36.6 C)  TempSrc: Oral  SpO2: 100%  Weight: 291 lb 9.6 oz (132.3 kg)  Height: 5\' 7"  (1.702 m)    General: A&O, in NAD, morbidly obese CV: RRR, normal s1, s2, no m/r/g,  Resp: equal and symmetric breath sounds, no wheezing or crackles  Abdomen: soft, obese, nontender, +BS Skin: warm, dry, intact, no open lesions or rashes noted Extremities: no edema bilaterally   Assessment & Plan:   See  encounters tab for problem based medical decision making. Patient discussed with Dr. Rogelia Boga

## 2016-05-01 NOTE — Assessment & Plan Note (Signed)
BP Readings from Last 3 Encounters:  05/01/16 123/70  12/21/15 103/74  06/09/15 104/74   Blood pressure is at goal on coreg, losartan, spironolactone and lasix.   Plan -continue current medications -follow up in 2 months

## 2016-05-01 NOTE — Assessment & Plan Note (Signed)
Patient has been experiencing fatigue for past year now. Has trouble sleeping. Last time TSH was checked was in 2011. Has had about 30-40 lbs weight gain.  Plan -repeat BMET and TSH

## 2016-05-02 LAB — BMP8+ANION GAP
Anion Gap: 15 mmol/L (ref 10.0–18.0)
BUN / CREAT RATIO: 16 (ref 9–20)
BUN: 16 mg/dL (ref 6–24)
CALCIUM: 9.8 mg/dL (ref 8.7–10.2)
CO2: 21 mmol/L (ref 18–29)
Chloride: 100 mmol/L (ref 96–106)
Creatinine, Ser: 0.98 mg/dL (ref 0.76–1.27)
GFR calc Af Amer: 97 mL/min/{1.73_m2} (ref >59)
GFR calc non Af Amer: 84 mL/min/{1.73_m2} (ref >59)
Glucose: 132 mg/dL — ABNORMAL HIGH (ref 65–99)
Potassium: 4.8 mmol/L (ref 3.5–5.2)
Sodium: 136 mmol/L (ref 134–144)

## 2016-05-02 LAB — URINALYSIS, COMPLETE
BILIRUBIN UA: NEGATIVE
GLUCOSE, UA: NEGATIVE
Leukocytes, UA: NEGATIVE
NITRITE UA: NEGATIVE
PH UA: 5 (ref 5.0–7.5)
PROTEIN UA: NEGATIVE
RBC UA: NEGATIVE
Specific Gravity, UA: >=1.03 — AB (ref 1.005–1.030)
UUROB: 0.2 mg/dL (ref 0.2–1.0)

## 2016-05-02 LAB — MICROSCOPIC EXAMINATION
Bacteria, UA: NONE SEEN
CASTS: NONE SEEN /LPF
Epithelial Cells (non renal): NONE SEEN /hpf (ref 0–10)

## 2016-05-02 LAB — TSH: TSH: 0.878 u[IU]/mL (ref 0.450–4.500)

## 2016-05-04 NOTE — Progress Notes (Signed)
Internal Medicine Clinic Attending  Case discussed with Dr. Saraiya at the time of the visit.  We reviewed the resident's history and exam and pertinent patient test results.  I agree with the assessment, diagnosis, and plan of care documented in the resident's note.  

## 2016-05-25 ENCOUNTER — Other Ambulatory Visit: Payer: Self-pay | Admitting: Internal Medicine

## 2016-06-12 ENCOUNTER — Other Ambulatory Visit: Payer: Self-pay | Admitting: Pharmacist

## 2016-06-12 ENCOUNTER — Other Ambulatory Visit: Payer: Medicaid Other

## 2016-06-12 DIAGNOSIS — B182 Chronic viral hepatitis C: Secondary | ICD-10-CM

## 2016-06-14 LAB — HEPATITIS C RNA QUANTITATIVE
HCV Quantitative Log: <1.18 Log IU/mL
HCV Quantitative: <15 IU/mL

## 2016-06-19 ENCOUNTER — Ambulatory Visit: Payer: Medicaid Other

## 2016-07-10 ENCOUNTER — Other Ambulatory Visit: Payer: Self-pay | Admitting: Internal Medicine

## 2016-07-18 MED FILL — LOSARTAN POTASSIUM 100 MG T: 100 | 90 days supply | Qty: 90 | Fill #0

## 2016-07-19 ENCOUNTER — Telehealth: Payer: Self-pay

## 2016-07-19 NOTE — Telephone Encounter (Signed)
Pharmacist called. I reviewed chart - pt on this dose of losartan for over 1 year. No indication that he is allergic/intolerant. Last clinic visit 05/01/16. No change in HTN meds noted.

## 2016-07-19 NOTE — Telephone Encounter (Signed)
[facility] called to verify that patient can tolerate lorsartin 100 mg because he had anaphylactic episode previously. Please advise.

## 2016-07-20 NOTE — Telephone Encounter (Signed)
Answer given to pharm

## 2016-07-31 ENCOUNTER — Other Ambulatory Visit: Payer: Self-pay | Admitting: Internal Medicine

## 2016-07-31 DIAGNOSIS — K219 Gastro-esophageal reflux disease without esophagitis: Secondary | ICD-10-CM

## 2016-08-02 MED FILL — OMEPRAZOLE DR 40 MG CAPSULE: 40 | 90 days supply | Qty: 90 | Fill #0

## 2016-08-08 ENCOUNTER — Other Ambulatory Visit: Payer: Self-pay

## 2016-08-08 ENCOUNTER — Emergency Department (HOSPITAL_COMMUNITY): Payer: Medicaid Other

## 2016-08-08 DIAGNOSIS — R42 Dizziness and giddiness: Secondary | ICD-10-CM

## 2016-08-08 DIAGNOSIS — Z5321 Procedure and treatment not carried out due to patient leaving prior to being seen by health care provider: Secondary | ICD-10-CM

## 2016-08-08 DIAGNOSIS — M6281 Muscle weakness (generalized): Secondary | ICD-10-CM

## 2016-08-08 LAB — BASIC METABOLIC PANEL
Anion gap: 8 (ref 5–15)
BUN: 8 mg/dL (ref 6–20)
CHLORIDE: 99 mmol/L — AB (ref 101–111)
CO2: 31 mmol/L (ref 22–32)
CREATININE: 1.03 mg/dL (ref 0.61–1.24)
Calcium: 9.2 mg/dL (ref 8.9–10.3)
GFR calc Af Amer: >60 mL/min (ref >60)
GFR calc non Af Amer: >60 mL/min (ref >60)
Glucose, Bld: 179 mg/dL — ABNORMAL HIGH (ref 65–99)
Potassium: 3.9 mmol/L (ref 3.5–5.1)
Sodium: 138 mmol/L (ref 135–145)

## 2016-08-08 LAB — URINALYSIS, ROUTINE W REFLEX MICROSCOPIC
Bacteria, UA: NONE SEEN
Bilirubin Urine: NEGATIVE
Glucose, UA: NEGATIVE mg/dL
KETONES UR: NEGATIVE mg/dL
Leukocytes, UA: NEGATIVE
Nitrite: NEGATIVE
PH: 6 (ref 5.0–8.0)
Protein, ur: NEGATIVE mg/dL
SPECIFIC GRAVITY, URINE: 1.016 (ref 1.005–1.030)
SQUAMOUS EPITHELIAL / LPF: NONE SEEN

## 2016-08-08 LAB — CBC
HCT: 39.6 % (ref 39.0–52.0)
Hemoglobin: 13.3 g/dL (ref 13.0–17.0)
MCH: 30.4 pg (ref 26.0–34.0)
MCHC: 33.6 g/dL (ref 30.0–36.0)
MCV: 90.6 fL (ref 78.0–100.0)
PLATELETS: 275 10*3/uL (ref 150–400)
RBC: 4.37 MIL/uL (ref 4.22–5.81)
RDW: 13.6 % (ref 11.5–15.5)
WBC: 8.9 10*3/uL (ref 4.0–10.5)

## 2016-08-08 LAB — CBG MONITORING, ED: Glucose-Capillary: 256 mg/dL — ABNORMAL HIGH (ref 65–99)

## 2016-08-08 MED ORDER — ALBUTEROL SULFATE (2.5 MG/3ML) 0.083% IN NEBU: 5.0000 mg | INHALATION_SOLUTION | Freq: Once | RESPIRATORY_TRACT | Status: DC

## 2016-08-08 NOTE — ED Notes (Signed)
Called for rooming with no answer x 3

## 2016-08-08 NOTE — ED Notes (Signed)
Called patient to update vital signs, no answer x2.

## 2016-08-08 NOTE — ED Triage Notes (Signed)
Pt endorses 2 days of progressive generalized weakness, fatigue, and intermittent lightheadedness. Pt endorses shob with exertion- difficulty doing everyday activities. Pt endorses cough- productive yellow phlegm. Denies fever, chills, chest pain, vision changes.

## 2016-08-09 ENCOUNTER — Emergency Department (HOSPITAL_COMMUNITY): Payer: Medicaid Other

## 2016-08-09 ENCOUNTER — Encounter (HOSPITAL_COMMUNITY): Payer: Self-pay | Admitting: *Deleted

## 2016-08-09 ENCOUNTER — Other Ambulatory Visit: Payer: Self-pay

## 2016-08-09 ENCOUNTER — Emergency Department (HOSPITAL_COMMUNITY)
Admission: EM | Admit: 2016-08-09 | Discharge: 2016-08-09 | Disposition: A | Discharge status: Home / Self Care | Payer: Medicaid Other | Source: Home / Self Care

## 2016-08-09 ENCOUNTER — Inpatient Hospital Stay (HOSPITAL_COMMUNITY)
Admission: EM | Admit: 2016-08-09 | Discharge: 2016-08-11 | DRG: 292 | Disposition: A | Discharge status: Home / Self Care | Payer: Medicaid Other | Attending: Oncology | Admitting: Oncology

## 2016-08-09 DIAGNOSIS — Z91013 Allergy to seafood: Secondary | ICD-10-CM

## 2016-08-09 DIAGNOSIS — Z888 Allergy status to other drugs, medicaments and biological substances status: Secondary | ICD-10-CM

## 2016-08-09 DIAGNOSIS — R05 Cough: Secondary | ICD-10-CM | POA: Diagnosis present

## 2016-08-09 DIAGNOSIS — Z79899 Other long term (current) drug therapy: Secondary | ICD-10-CM | POA: Diagnosis not present

## 2016-08-09 DIAGNOSIS — M109 Gout, unspecified: Secondary | ICD-10-CM | POA: Diagnosis present

## 2016-08-09 DIAGNOSIS — I110 Hypertensive heart disease with heart failure: Secondary | ICD-10-CM | POA: Diagnosis present

## 2016-08-09 DIAGNOSIS — F1010 Alcohol abuse, uncomplicated: Secondary | ICD-10-CM | POA: Diagnosis present

## 2016-08-09 DIAGNOSIS — I5022 Chronic systolic (congestive) heart failure: Secondary | ICD-10-CM

## 2016-08-09 DIAGNOSIS — I10 Essential (primary) hypertension: Secondary | ICD-10-CM | POA: Diagnosis present

## 2016-08-09 DIAGNOSIS — I429 Cardiomyopathy, unspecified: Secondary | ICD-10-CM | POA: Diagnosis present

## 2016-08-09 DIAGNOSIS — E785 Hyperlipidemia, unspecified: Secondary | ICD-10-CM | POA: Diagnosis present

## 2016-08-09 DIAGNOSIS — R0600 Dyspnea, unspecified: Secondary | ICD-10-CM

## 2016-08-09 DIAGNOSIS — Z87892 Personal history of anaphylaxis: Secondary | ICD-10-CM

## 2016-08-09 DIAGNOSIS — F1410 Cocaine abuse, uncomplicated: Secondary | ICD-10-CM | POA: Diagnosis present

## 2016-08-09 DIAGNOSIS — F329 Major depressive disorder, single episode, unspecified: Secondary | ICD-10-CM | POA: Diagnosis present

## 2016-08-09 DIAGNOSIS — B182 Chronic viral hepatitis C: Secondary | ICD-10-CM | POA: Diagnosis present

## 2016-08-09 DIAGNOSIS — F17210 Nicotine dependence, cigarettes, uncomplicated: Secondary | ICD-10-CM | POA: Diagnosis present

## 2016-08-09 DIAGNOSIS — R42 Dizziness and giddiness: Secondary | ICD-10-CM | POA: Diagnosis present

## 2016-08-09 DIAGNOSIS — R748 Abnormal levels of other serum enzymes: Secondary | ICD-10-CM | POA: Diagnosis present

## 2016-08-09 DIAGNOSIS — Z880 Allergy status to penicillin: Secondary | ICD-10-CM

## 2016-08-09 DIAGNOSIS — I5021 Acute systolic (congestive) heart failure: Secondary | ICD-10-CM

## 2016-08-09 DIAGNOSIS — I5043 Acute on chronic combined systolic (congestive) and diastolic (congestive) heart failure: Secondary | ICD-10-CM | POA: Diagnosis present

## 2016-08-09 DIAGNOSIS — M19012 Primary osteoarthritis, left shoulder: Secondary | ICD-10-CM | POA: Diagnosis present

## 2016-08-09 DIAGNOSIS — Z7982 Long term (current) use of aspirin: Secondary | ICD-10-CM | POA: Diagnosis not present

## 2016-08-09 DIAGNOSIS — Z8249 Family history of ischemic heart disease and other diseases of the circulatory system: Secondary | ICD-10-CM | POA: Diagnosis not present

## 2016-08-09 DIAGNOSIS — K219 Gastro-esophageal reflux disease without esophagitis: Secondary | ICD-10-CM | POA: Diagnosis present

## 2016-08-09 DIAGNOSIS — I509 Heart failure, unspecified: Secondary | ICD-10-CM

## 2016-08-09 DIAGNOSIS — G4733 Obstructive sleep apnea (adult) (pediatric): Secondary | ICD-10-CM | POA: Diagnosis present

## 2016-08-09 DIAGNOSIS — Z6841 Body Mass Index (BMI) 40.0 and over, adult: Secondary | ICD-10-CM | POA: Diagnosis not present

## 2016-08-09 DIAGNOSIS — R0902 Hypoxemia: Secondary | ICD-10-CM | POA: Diagnosis present

## 2016-08-09 DIAGNOSIS — I428 Other cardiomyopathies: Secondary | ICD-10-CM

## 2016-08-09 DIAGNOSIS — M6281 Muscle weakness (generalized): Secondary | ICD-10-CM | POA: Diagnosis present

## 2016-08-09 LAB — BASIC METABOLIC PANEL
Anion gap: 12 (ref 5–15)
BUN: 8 mg/dL (ref 6–20)
CO2: 26 mmol/L (ref 22–32)
Calcium: 8.8 mg/dL — ABNORMAL LOW (ref 8.9–10.3)
Chloride: 99 mmol/L — ABNORMAL LOW (ref 101–111)
Creatinine, Ser: 1.01 mg/dL (ref 0.61–1.24)
GFR calc Af Amer: >60 mL/min (ref >60)
GFR calc non Af Amer: >60 mL/min (ref >60)
Glucose, Bld: 199 mg/dL — ABNORMAL HIGH (ref 65–99)
Potassium: 4.1 mmol/L (ref 3.5–5.1)
Sodium: 137 mmol/L (ref 135–145)

## 2016-08-09 LAB — RAPID URINE DRUG SCREEN, HOSP PERFORMED
Amphetamines: NOT DETECTED
Barbiturates: NOT DETECTED
Benzodiazepines: NOT DETECTED
Cocaine: NOT DETECTED
Opiates: NOT DETECTED
Tetrahydrocannabinol: POSITIVE — AB

## 2016-08-09 LAB — CBC
HCT: 39.4 % (ref 39.0–52.0)
Hemoglobin: 13.3 g/dL (ref 13.0–17.0)
MCH: 30.4 pg (ref 26.0–34.0)
MCHC: 33.8 g/dL (ref 30.0–36.0)
MCV: 90 fL (ref 78.0–100.0)
Platelets: 272 10*3/uL (ref 150–400)
RBC: 4.38 MIL/uL (ref 4.22–5.81)
RDW: 13.7 % (ref 11.5–15.5)
WBC: 9.6 10*3/uL (ref 4.0–10.5)

## 2016-08-09 LAB — BRAIN NATRIURETIC PEPTIDE: B NATRIURETIC PEPTIDE 5: 396.3 pg/mL — AB (ref 0.0–100.0)

## 2016-08-09 LAB — I-STAT TROPONIN, ED: Troponin i, poc: 0.09 ng/mL (ref 0.00–0.08)

## 2016-08-09 LAB — TROPONIN I: TROPONIN I: 0.18 ng/mL — AB (ref <0.03)

## 2016-08-09 MED ORDER — FENOFIBRATE 160 MG PO TABS
160.0000 mg | ORAL_TABLET | Freq: Every day | ORAL | Status: DC
  Administered 2016-08-10 – 2016-08-11 (×2): 160 mg via ORAL
  Filled 2016-08-09 (×2): qty 1

## 2016-08-09 MED ORDER — FUROSEMIDE 10 MG/ML IJ SOLN
40.0000 mg | Freq: Once | INTRAMUSCULAR | Status: AC
  Administered 2016-08-09: 40 mg via INTRAVENOUS
  Filled 2016-08-09: qty 4

## 2016-08-09 MED ORDER — PANTOPRAZOLE SODIUM 40 MG PO TBEC
40.0000 mg | DELAYED_RELEASE_TABLET | Freq: Every day | ORAL | Status: DC
  Administered 2016-08-10 – 2016-08-11 (×2): 40 mg via ORAL
  Filled 2016-08-09 (×2): qty 1

## 2016-08-09 MED ORDER — LORAZEPAM 1 MG PO TABS: 1.0000 mg | ORAL_TABLET | Freq: Four times a day (QID) | ORAL | Status: DC | PRN

## 2016-08-09 MED ORDER — ALBUTEROL SULFATE (2.5 MG/3ML) 0.083% IN NEBU: 3.0000 mL | INHALATION_SOLUTION | RESPIRATORY_TRACT | Status: DC | PRN

## 2016-08-09 MED ORDER — VITAMIN B-1 100 MG PO TABS
100.0000 mg | ORAL_TABLET | Freq: Every day | ORAL | Status: DC
  Administered 2016-08-09 – 2016-08-11 (×3): 100 mg via ORAL
  Filled 2016-08-09 (×3): qty 1

## 2016-08-09 MED ORDER — ADULT MULTIVITAMIN W/MINERALS CH
1.0000 | ORAL_TABLET | Freq: Every day | ORAL | Status: DC
  Administered 2016-08-09 – 2016-08-11 (×3): 1 via ORAL
  Filled 2016-08-09 (×3): qty 1

## 2016-08-09 MED ORDER — FOLIC ACID 1 MG PO TABS
1.0000 mg | ORAL_TABLET | Freq: Every day | ORAL | Status: DC
  Administered 2016-08-09 – 2016-08-11 (×3): 1 mg via ORAL
  Filled 2016-08-09 (×3): qty 1

## 2016-08-09 MED ORDER — ADULT MULTIVITAMIN W/MINERALS CH: 1.0000 | ORAL_TABLET | Freq: Every day | ORAL | Status: DC

## 2016-08-09 MED ORDER — THIAMINE HCL 100 MG/ML IJ SOLN
100.0000 mg | Freq: Every day | INTRAMUSCULAR | Status: DC
  Filled 2016-08-09: qty 2

## 2016-08-09 MED ORDER — ASPIRIN EC 81 MG PO TBEC
81.0000 mg | DELAYED_RELEASE_TABLET | Freq: Every day | ORAL | Status: DC
  Administered 2016-08-10 – 2016-08-11 (×2): 81 mg via ORAL
  Filled 2016-08-09 (×2): qty 1

## 2016-08-09 MED ORDER — LORAZEPAM 1 MG PO TABS: 0.0000 mg | ORAL_TABLET | Freq: Four times a day (QID) | ORAL | Status: DC

## 2016-08-09 MED ORDER — FUROSEMIDE 10 MG/ML IJ SOLN
40.0000 mg | Freq: Two times a day (BID) | INTRAMUSCULAR | Status: DC
  Administered 2016-08-09 – 2016-08-11 (×4): 40 mg via INTRAVENOUS
  Filled 2016-08-09 (×4): qty 4

## 2016-08-09 MED ORDER — LOSARTAN POTASSIUM 50 MG PO TABS
100.0000 mg | ORAL_TABLET | Freq: Every day | ORAL | Status: DC
  Administered 2016-08-10 – 2016-08-11 (×2): 100 mg via ORAL
  Filled 2016-08-09 (×2): qty 2

## 2016-08-09 MED ORDER — ENOXAPARIN SODIUM 40 MG/0.4ML ~~LOC~~ SOLN
40.0000 mg | SUBCUTANEOUS | Status: DC
  Administered 2016-08-10 – 2016-08-11 (×2): 40 mg via SUBCUTANEOUS
  Filled 2016-08-09 (×2): qty 0.4

## 2016-08-09 MED ORDER — SODIUM CHLORIDE 0.9% FLUSH
3.0000 mL | Freq: Two times a day (BID) | INTRAVENOUS | Status: DC
  Administered 2016-08-10 – 2016-08-11 (×4): 3 mL via INTRAVENOUS

## 2016-08-09 MED ORDER — CARVEDILOL 12.5 MG PO TABS
12.5000 mg | ORAL_TABLET | Freq: Two times a day (BID) | ORAL | Status: DC
  Administered 2016-08-10 – 2016-08-11 (×3): 12.5 mg via ORAL
  Filled 2016-08-09 (×4): qty 1

## 2016-08-09 MED ORDER — LORAZEPAM 2 MG/ML IJ SOLN: 1.0000 mg | Freq: Four times a day (QID) | INTRAMUSCULAR | Status: DC | PRN

## 2016-08-09 MED ORDER — LORAZEPAM 1 MG PO TABS: 0.0000 mg | ORAL_TABLET | Freq: Two times a day (BID) | ORAL | Status: DC

## 2016-08-09 NOTE — ED Notes (Signed)
EDP at bedside  

## 2016-08-09 NOTE — ED Notes (Signed)
Patient to xray.

## 2016-08-09 NOTE — Progress Notes (Signed)
  Pt admitted to the unit. Pt is stable, alert and oriented per baseline. Oriented to room, staff, and call bell. Educated to call for any assistance. Bed in lowest position, call bell within reach- will continue to monitor. 

## 2016-08-09 NOTE — H&P (Signed)
Date: 08/09/2016               Patient Name:  Ethan Wilcox MRN: [id number]  DOB: 1955/08/13 Age / Sex: 61 y.o., male   PCP: [name], MD         Medical Service: Internal Medicine Teaching Service         Attending Physician: Dr. Levert Feinstein, MD    First Contact: Dr. Minda Meo Pager: [phone]  Second Contact: Dr. Earlene Plater Pager: [phone]       After Hours (After 5p/  First Contact Pager: [phone]  weekends / holidays): Second Contact Pager: [phone]   Chief Complaint: Shortness of Breath  History of Present Illness:  61 yo male with PMHx CHF, HCV (s/p Zepatier), OSA, ETOH abuse, HTN, nonischemic cardiomyopathy, and GERD presenting for shortness of breath and generalized weakness. Patient states the SOB has acutely worsened over the past 3 days, but has slowly been progressing within the past month. The shortness of breath is mainly exertional, when the patient is getting dressed or walking. But he does complain of difficulty breathing while lying down and needing to be propped up at night.  He states that it only lasts for a short period of time and is relieved by rest and sitting up straight. He states he is compliant with all of his medications and has not missed any doses recently. He also complains of a dry cough that started 3 days ago as well. He denies fever, chills, nausea, vomiting, diarrhea, and chest pain.   He states has happened to him about three years ago, which he was hospitalized and treated with Lasix, which improved his symptoms.   Meds:  Current Meds  Medication Sig  . albuterol (PROVENTIL HFA;VENTOLIN HFA) 108 (90 Base) MCG/ACT inhaler Inhale 2 puffs into the lungs every 4 (four) hours as needed for wheezing or shortness of breath.  [name] aspirin (ASPIR-LOW) 81 MG EC tablet Take 81 mg by mouth daily.   . carvedilol (COREG) 25 MG tablet TAKE 1 TABLET BY MOUTH TWICE DAILY (Patient taking differently: TAKE 25 mg  BY MOUTH TWICE DAILY)  .  Dextromethorphan-Benzocaine (COUGH/SORE THROAT LOZENGES MT) Use as directed 1-3 drops in the mouth or throat daily as needed (cough).  . diphenhydrAMINE (BENADRYL) 25 MG tablet Take 25 mg by mouth every 6 (six) hours as needed for allergies. Reported on 05/24/2015  . fenofibrate (TRICOR) 145 MG tablet Take 1 tablet (145 mg total) by mouth daily.  . furosemide (LASIX) 40 MG tablet Take 1 tablet (40 mg total) by mouth 2 (two) times daily. (Patient taking differently: Take 80 mg by mouth 2 (two) times daily. )  . guaifenesin (ROBITUSSIN) 100 MG/5ML syrup Take 200 mg by mouth 3 (three) times daily as needed for cough.  . losartan (COZAAR) 100 MG tablet TAKE 1 TABLET (100 MG TOTAL) BY MOUTH DAILY.  . Multiple Vitamin (MULTIVITAMIN WITH MINERALS) TABS Take 1 tablet by mouth daily.  [name] omeprazole (PRILOSEC) 40 MG capsule TAKE 1 CAPSULE BY MOUTH DAILY  . polyethylene glycol powder (GLYCOLAX/MIRALAX) powder MIX 1 CAPFUL IN LIQUID AND DRINK DAILY AS DIRECTED  . spironolactone (ALDACTONE) 25 MG tablet TAKE 1 TABLET(25 MG) BY MOUTH DAILY     Allergies: Allergies as of 08/09/2016 - Review Complete 08/09/2016  Allergen Reaction Noted  . Lisinopril Anaphylaxis 08/28/2012  . Shrimp [shellfish allergy] Anaphylaxis 10/19/2012  . Penicillins Other (See Comments) 06/04/2009   Past Medical History:  Diagnosis Date  .  Active smoker /   1/2 ppd  . CHF (congestive heart failure) (HCC)   . Cocaine abuse   . ETOH abuse   . GERD (gastroesophageal reflux disease)   . Gout   . Hx of anaphylaxis with angioedema 08/23/2012   Admitted with angioedema on 08/24/2012 No intubation Unknown allergen Shrimp Vs Spices Vs ACEis Stopped Lisinopril   . Hypertension   . Nonischemic cardiomyopathy (HCC)    admitted 5/11 w acute systolic CHF. LHC showed no angiographic CAD. Echo (5/11) showed EF 15-20% w diffuse sever hypokinesis, moderate MR> RHC showed PCWP 10, CI 1.47 (Fick.) cause of CMP thought to be heavy ETOH and cocaine. No  ICD yet due to ongoing ETOH intake  . Obesity   . Sleep apnea     Family History:   Heart disease Mother   . Cancer Mother   . Heart disease Father    Social History:  Smoking history: 1 ppd for over 40 years Alcohol: Will consume 1 pint of vodka and several beers on a given day, up to 3 days per week  Denies illicit drug use  Review of Systems: A complete ROS was negative except as per HPI.   Physical Exam: Blood pressure 116/75, pulse 60, temperature 98.3 F (36.8 C), temperature source Oral, resp. rate 13, SpO2 94 %. Physical Exam  Constitutional: He is oriented to person, place, and time. He appears well-developed and well-nourished. No distress.  HENT:  Head: Normocephalic and atraumatic.  Eyes: Conjunctivae and EOM are normal.  Neck: Normal range of motion. Neck supple. No JVD present.  Cardiovascular: Normal rate, regular rhythm and normal heart sounds.   Pulmonary/Chest: Breath sounds normal. No respiratory distress (Intermittent SOB throught exam).  Difficult hearing breath sounds due to body habitus.  Abdominal: Bowel sounds are normal. He exhibits distension. There is no tenderness.  Musculoskeletal: Normal range of motion. He exhibits no edema.  Neurological: He is alert and oriented to person, place, and time.  Skin: Skin is warm and dry.  Psychiatric: He has a normal mood and affect.    EKG: personally reviewed my interpretation is normal sinus rhythm with no acute changes.   CXR: personally reviewed my interpretation is cardiomegaly with chronic, basilar interstitial densities.   Assessment & Plan by Problem: Active Problems:   Alcohol abuse   HYPERTENSION   Severe OSA   Gastroesophageal reflux disease   Obesity, morbid (HCC)   Hyperlipidemia   Nonischemic cardiomyopathy (HCC)   Acute CHF (congestive heart failure) (HCC)  Shortness of breath Exertional, progressively worsening over a month. Most likely related to volume overload. Even though the  patient was compliant with medication, he was not taking his Lasix 40 mg properly and was only taking half the dose prescribed.  -Lasix 40 mg BID -Daily weights  -Strict I/Os -Telemetry -Trend troponin, initial POC mildly elevated at 0.09  CHF NICM Most recent ECHO in 03/2015. LVEF: 40-45% much improved from previous ECHO 04/2012  With LVEF: 25-30%.  -BNP 396.3 -Repeat ECHO today, follow up results  -Spironolactone 25 mg daily -Carvedilol 25 mg BID  Alcohol Abuse -CIWA protocol   OSA -CPAP at bedtime  Dispo: Admit patient to Inpatient with expected length of stay greater than 2 midnights.  Signed: Toney Rakes, MD 08/09/2016, 5:54 PM  Pager: [phone]

## 2016-08-09 NOTE — ED Provider Notes (Signed)
[facility]ENCY DEPT Provider Note   CSN: [id number] Arrival date & time: 08/09/16  6948     History   Chief Complaint Chief Complaint  Patient presents with  . Shortness of Breath    HPI Ethan Wilcox is a 61 y.o. male.  HPI  Pt comes in with cc of DIB. Pt has cough, shortness of breath for 2-3 minutes. Pt has hx of NICM (40-45 EF), cocaine use and HTN. No fever, chills. Pt reports that over the past few days he has had worsening dyspnea on exertion - now walking 5-6 steps gets him short of breath. He also is requiring more pillows at night time. PT has cough, productive. No associated fevers, chills. Pt was advised to double his lasix, and he hasnt. Pt has no hx of PE, DVT and denies any exogenous hormone (testosterone / estrogen) use, long distance travels or surgery in the past 6 weeks, active cancer, recent immobilization.   Past Medical History:  Diagnosis Date  . Active smoker /   1/2 ppd  . CHF (congestive heart failure) (HCC)   . Cocaine abuse   . ETOH abuse   . GERD (gastroesophageal reflux disease)   . Gout   . Hx of anaphylaxis with angioedema 08/23/2012   Admitted with angioedema on 08/24/2012 No intubation Unknown allergen Shrimp Vs Spices Vs ACEis Stopped Lisinopril   . Hypertension   . Nonischemic cardiomyopathy (HCC)    admitted 5/11 w acute systolic CHF. LHC showed no angiographic CAD. Echo (5/11) showed EF 15-20% w diffuse sever hypokinesis, moderate MR> RHC showed PCWP 10, CI 1.47 (Fick.) cause of CMP thought to be heavy ETOH and cocaine. No ICD yet due to ongoing ETOH intake  . Obesity   . Sleep apnea     Patient Active Problem List   Diagnosis Date Noted  . Elevated serum creatinine 05/01/2016  . Fatigue 05/01/2016  . Hepatic cirrhosis (HCC) 12/21/2015  . Chronic hepatitis C without hepatic coma (HCC) 06/09/2015  . Health care maintenance 05/24/2015  . Hepatitis C antibody test positive 03/31/2015  . Microscopic hematuria 03/31/2015  .  Cardiomyopathy (HCC)   . Acute bronchitis 03/09/2015  . Nonischemic cardiomyopathy (HCC) 02/10/2015  . Headache 02/10/2015  . Osteoarthritis of left shoulder 12/09/2014  . Hyperlipidemia 07/21/2014  . External hemorrhoid 10/22/2013  . Obesity, morbid (HCC) 07/30/2013  . Depression 02/26/2013  . Gastroesophageal reflux disease 02/26/2013  . Arthritis 02/26/2013  . Illiteracy 08/28/2012  . Hx of Anaphylaxis 08/23/2012  . Severe OSA 11/27/2010  . Systolic CHF 08/03/2009  . Alcohol abuse 06/04/2009  . TOBACCO ABUSE 06/04/2009  . HYPERTENSION 06/04/2009    Past Surgical History:  Procedure Laterality Date  . NO PAST SURGERIES         Home Medications    Prior to Admission medications   Medication Sig Start Date End Date Taking? Authorizing Provider  albuterol (PROVENTIL HFA;VENTOLIN HFA) 108 (90 Base) MCG/ACT inhaler Inhale 2 puffs into the lungs every 4 (four) hours as needed for wheezing or shortness of breath. 03/12/15  Yes Lora Paula, MD  aspirin (ASPIR-LOW) 81 MG EC tablet Take 81 mg by mouth daily.    Yes [provider]  carvedilol (COREG) 25 MG tablet TAKE 1 TABLET BY MOUTH TWICE DAILY Patient taking differently: TAKE 25 mg  BY MOUTH TWICE DAILY 02/14/16  Yes [name], MD  Dextromethorphan-Benzocaine (COUGH/SORE THROAT LOZENGES MT) Use as directed 1-3 drops in the mouth or throat daily as needed (cough).  Yes [provider]  diphenhydrAMINE (BENADRYL) 25 MG tablet Take 25 mg by mouth every 6 (six) hours as needed for allergies. Reported on 05/24/2015   Yes [provider]  fenofibrate (TRICOR) 145 MG tablet Take 1 tablet (145 mg total) by mouth daily. 06/02/15  Yes Bensimhon, Bevelyn Buckles, MD  furosemide (LASIX) 40 MG tablet Take 1 tablet (40 mg total) by mouth 2 (two) times daily. Patient taking differently: Take 80 mg by mouth 2 (two) times daily.  04/14/15  Yes Marrian Salvage, MD  guaifenesin (ROBITUSSIN) 100 MG/5ML syrup Take 200 mg by  mouth 3 (three) times daily as needed for cough.   Yes [provider]  losartan (COZAAR) 100 MG tablet TAKE 1 TABLET (100 MG TOTAL) BY MOUTH DAILY. 07/18/16  Yes Inez Catalina, MD  Multiple Vitamin (MULTIVITAMIN WITH MINERALS) TABS Take 1 tablet by mouth daily.   Yes [provider]  omeprazole (PRILOSEC) 40 MG capsule TAKE 1 CAPSULE BY MOUTH DAILY 08/02/16  Yes [name], MD  polyethylene glycol powder (GLYCOLAX/MIRALAX) powder MIX 1 CAPFUL IN LIQUID AND DRINK DAILY AS DIRECTED 05/01/16  Yes [name], MD  spironolactone (ALDACTONE) 25 MG tablet TAKE 1 TABLET(25 MG) BY MOUTH DAILY 05/26/16  Yes [name], MD    Family History Family History  Problem Relation Age of Onset  . Heart disease Mother   . Cancer Mother   . Heart disease Father     Social History Social History  Substance Use Topics  . Smoking status: Current Every Day Smoker    Packs/day: 0.75    Years: 38.00    Types: Cigarettes    Start date: 01/10/1972  . Smokeless tobacco: Not on file     Comment: down to .5 pk.  about 1 week ago.  Stopped 2.5 weeks ago  . Alcohol use 7.2 oz/week    12 Shots of liquor per week     Comment: Vodka 1/2 pint - on/off.     Allergies   Lisinopril; Shrimp [shellfish allergy]; and Penicillins   Review of Systems Review of Systems  Respiratory: Positive for cough and shortness of breath.   Cardiovascular: Positive for chest pain.  All other systems reviewed and are negative.    Physical Exam Updated Vital Signs BP 136/88   Pulse 88   Temp 98.3 F (36.8 C) (Oral)   Resp 20   SpO2 95%   Physical Exam  Constitutional: He is oriented to person, place, and time. He appears well-developed.  HENT:  Head: Atraumatic.  Eyes: EOM are normal.  Neck: Neck supple. No JVD present.  Cardiovascular: Normal rate.   Pulmonary/Chest: Effort normal. He has rales.  L sided basilar rales  Abdominal: Soft.  Musculoskeletal: He exhibits no edema or  tenderness.  Neurological: He is alert and oriented to person, place, and time.  Skin: Skin is warm.  Nursing note and vitals reviewed.    ED Treatments / Results  Labs (all labs ordered are listed, but only abnormal results are displayed) Labs Reviewed  BASIC METABOLIC PANEL - Abnormal; Notable for the following:       Result Value   Chloride 99 (*)    Glucose, Bld 199 (*)    Calcium 8.8 (*)    All other components within normal limits  BRAIN NATRIURETIC PEPTIDE - Abnormal; Notable for the following:    B Natriuretic Peptide 396.3 (*)    All other components within normal limits  I-STAT TROPONIN, ED - Abnormal;  Notable for the following:    Troponin i, poc 0.09 (*)    All other components within normal limits  CBC  RAPID URINE DRUG SCREEN, HOSP PERFORMED    EKG  EKG Interpretation None       Radiology Dg Chest 2 View  Result Date: 08/08/2016 CLINICAL DATA:  61 year old male with 2 days of progressed generalized weakness, fatigue, intermittent lightheadedness, shortness of breath. Productive cough. EXAM: CHEST  2 VIEW COMPARISON:  Chest CTA 03/22/2015 and earlier. FINDINGS: Chronic basilar predominant increased pulmonary interstitial opacity, not significantly changed compared to 03/22/2015. Trace pleural fluid or pleural thickening. Chronic cardiomegaly appears stable. Other mediastinal contours are within normal limits. Visualized tracheal air column is within normal limits. No pneumothorax. No consolidation or new pulmonary opacity. No acute osseous abnormality identified. Negative visible bowel gas pattern. IMPRESSION: Chronic cardiomegaly and basilar predominant increased pulmonary interstitial opacity. Stable appearance of the chest compared to 03/22/2015. Differential considerations include chronic interstitial lung disease versus recurrent interstitial edema versus viral/atypical infection. Electronically Signed   By: Odessa Fleming M.D.   On: 08/08/2016 17:27     Procedures Procedures (including critical care time)  Medications Ordered in ED Medications  furosemide (LASIX) injection 40 mg (40 mg Intravenous Given 08/09/16 1208)     Initial Impression / Assessment and Plan / ED Course  I have reviewed the triage vital signs and the nursing notes.  Pertinent labs & imaging results that were available during my care of the patient were reviewed by me and considered in my medical decision making (see chart for details).     Pt comes in with cc of DIB. Pt has hx of CHF and clinically it seems that he has a CHF exacerbation. Pt has worsening orthopnea, PNF and DOE. No pitting edema and the lung exam is mostly benign. Pt had hypoxia when we ambulated him. Admit to medicine for optimization. Pt will get iv lasix now. Pt had a CXR last night, so we will not repeat.  Final Clinical Impressions(s) / ED Diagnoses   Final diagnoses:  Dyspnea  Acute systolic congestive heart failure (HCC)    New Prescriptions New Prescriptions   No medications on file     Derwood Kaplan, MD 08/09/16 1336

## 2016-08-09 NOTE — ED Notes (Signed)
Called again, no answer

## 2016-08-09 NOTE — ED Notes (Signed)
Pt reports he drinks about 3 nights per week to help him sleep. He states when he drinks he has about a fifth of liquor. When asked about his CPAP machine, he reports he does not have it yet.

## 2016-08-09 NOTE — ED Triage Notes (Signed)
Pt reports sob with mild exertion and "just not feeling right for several days." hx of chf. Airway is intact at triage. Reports recent swelling to hands and feet.

## 2016-08-09 NOTE — ED Notes (Signed)
Patient did not answer when called x 3.  Will d/c

## 2016-08-09 NOTE — ED Notes (Signed)
Admitting at bedside 

## 2016-08-09 NOTE — ED Notes (Signed)
MD paged about Trop. Pt pain free at this time.

## 2016-08-10 ENCOUNTER — Inpatient Hospital Stay (HOSPITAL_COMMUNITY): Payer: Medicaid Other

## 2016-08-10 ENCOUNTER — Encounter (HOSPITAL_COMMUNITY): Payer: Self-pay | Admitting: General Practice

## 2016-08-10 DIAGNOSIS — I5021 Acute systolic (congestive) heart failure: Secondary | ICD-10-CM

## 2016-08-10 LAB — CBC
HCT: 42.4 % (ref 39.0–52.0)
Hemoglobin: 14.5 g/dL (ref 13.0–17.0)
MCH: 30.9 pg (ref 26.0–34.0)
MCHC: 34.2 g/dL (ref 30.0–36.0)
MCV: 90.2 fL (ref 78.0–100.0)
Platelets: 278 10*3/uL (ref 150–400)
RBC: 4.7 MIL/uL (ref 4.22–5.81)
RDW: 13.8 % (ref 11.5–15.5)
WBC: 9.5 10*3/uL (ref 4.0–10.5)

## 2016-08-10 LAB — ECHOCARDIOGRAM COMPLETE
Height: 67 in
Weight: 4500.91 oz

## 2016-08-10 LAB — COMPREHENSIVE METABOLIC PANEL
ALBUMIN: 3.8 g/dL (ref 3.5–5.0)
ALK PHOS: 92 U/L (ref 38–126)
ALT: 24 U/L (ref 17–63)
AST: 21 U/L (ref 15–41)
Anion gap: 9 (ref 5–15)
BILIRUBIN TOTAL: 1.2 mg/dL (ref 0.3–1.2)
BUN: 9 mg/dL (ref 6–20)
CALCIUM: 8.9 mg/dL (ref 8.9–10.3)
CO2: 29 mmol/L (ref 22–32)
Chloride: 100 mmol/L — ABNORMAL LOW (ref 101–111)
Creatinine, Ser: 1.02 mg/dL (ref 0.61–1.24)
GFR calc Af Amer: >60 mL/min (ref >60)
GFR calc non Af Amer: >60 mL/min (ref >60)
GLUCOSE: 178 mg/dL — AB (ref 65–99)
Potassium: 3.8 mmol/L (ref 3.5–5.1)
Sodium: 138 mmol/L (ref 135–145)
TOTAL PROTEIN: 6.9 g/dL (ref 6.5–8.1)

## 2016-08-10 LAB — HIV ANTIBODY (ROUTINE TESTING W REFLEX): HIV Screen 4th Generation wRfx: NONREACTIVE

## 2016-08-10 LAB — TROPONIN I
TROPONIN I: 0.17 ng/mL — AB (ref <0.03)
Troponin I: 0.19 ng/mL (ref <0.03)

## 2016-08-10 MED ORDER — PERFLUTREN LIPID MICROSPHERE
1.0000 mL | INTRAVENOUS | Status: AC | PRN
  Administered 2016-08-10: 2 mL via INTRAVENOUS
  Filled 2016-08-10: qty 10

## 2016-08-10 MED ORDER — PNEUMOCOCCAL VAC POLYVALENT 25 MCG/0.5ML IJ INJ
0.5000 mL | INJECTION | INTRAMUSCULAR | Status: AC
Start: 2016-08-11 — End: 2016-08-11
  Administered 2016-08-11: 0.5 mL via INTRAMUSCULAR
  Filled 2016-08-10: qty 0.5

## 2016-08-10 NOTE — Care Management Note (Addendum)
Case Management Note  Patient Details  Name: Ethan Wilcox MRN: [id number] Date of Birth: 01-27-1955  Subjective/Objective:  Pt presented for SOB and generalized weakness. Pt with hx of OSA last Sleep study at the [facility] was Jan 5th 2018. Pt was able to be titrated and the results were sent to [facility] office- no return order was sent to sleep disorder center for CPAP. Pt is from home with daughter. Plans will be to return home once stable. Pt has Medicaid- no issues with purchasing medications. Pt is aware of Medicaid Transportation for Dr. visits.     Action/Plan: CM did call [name] to clarify the rules for Medicaid patients and if he would need a new sleep study or would the patient qualify for CPAP 2/2 Sleep Study performed Jan 14, 2016. CM awaiting call back from [facility]. No other needs identified at this time by CM.   Expected Discharge Date:                  Expected Discharge Plan:  Home/Self Care  In-House Referral:  NA  Discharge planning Services  CM Consult  Post Acute Care Choice:  Durable Medical Equipment Choice offered to:  Patient  DME Arranged:  CPAP DME Agency:  [facility]  HH Arranged:  NA HH Agency:  NA  Status of Service:  Completed, signed off  If discussed at Long Length of Stay Meetings, dates discussed:    Additional Comments: 1409 08-10-16 Tomi Bamberger, RN,BSN [phone] CM did receive call from [facility]- with further investigation Medicaid does not have the same rules in regards for CPAP- Sleep Study is good for one year. CM will cancel the original Sleep Study Appointment and ask MD for CPAP orders. MD can look in EPIC last sleep study to get the actual pressures. [name] will be able to provide DME CPAP for patient. CM spoke with Dr. Minda Meo in regards to new orders and walked her through the process to order. No further needs from CM.    1217 08-10-16 Tomi Bamberger, RN,  BSN [phone]  CM did  speak with [facility] and the Medicaid Guidelines for CPAP follows Medicare Guidelines- Sleep study has to be done within 6 months of receiving CPAP. CM did speak with MD Juluis Mire in regards to new order for Sleep Study: New Sleep Study Scheduled for Tuesday  August 22, 2016 @ 8 pm. CM to place the appointment on the AVS. No further needs at this time. Gala Lewandowsky, RN 08/10/2016, 11:40 AM

## 2016-08-10 NOTE — Progress Notes (Signed)
   Subjective: Patient was sleeping with CPAP machine on upon entering the room. He states he is still having intermittent episodes of shortness of breath. He denies alcohol withdrawal symptoms. He states he has not been urinating frequently. He denies any chest pain. The patient did exhibit some exertional dyspnea when changes positions in the bed.  Objective:  Vital signs in last 24 hours: Vitals:   08/09/16 2253 08/10/16 0015 08/10/16 0103 08/10/16 0435  BP: 116/62 114/78  106/88  Pulse: 86 80  76  Resp: 20 16  20   Temp:  98.5 F (36.9 C)  98.1 F (36.7 C)  TempSrc:  Oral  Oral  SpO2: 98% 99%  96%  Weight:  281 lb 4.8 oz (127.6 kg) 281 lb 4.9 oz (127.6 kg)   Height:  5\' 7"  (1.702 m)     Physical Exam  Constitutional: He is oriented to person, place, and time. He appears well-developed and well-nourished.  HENT:  Head: Normocephalic and atraumatic.  Eyes: Pupils are equal, round, and reactive to light. Conjunctivae and EOM are normal.  Neck: Normal range of motion. Neck supple. No hepatojugular reflux and no JVD present.  Cardiovascular: Normal rate, regular rhythm, normal heart sounds and intact distal pulses.   Pulmonary/Chest: Effort normal and breath sounds normal.  Abdominal: Soft. Normal appearance and bowel sounds are normal. There is no tenderness.  Musculoskeletal: Normal range of motion. He exhibits no edema.  Neurological: He is alert and oriented to person, place, and time.  Skin: Skin is warm and dry.  Psychiatric: He has a normal mood and affect.    Assessment/Plan:  Active Problems:   Alcohol abuse   HYPERTENSION   Severe OSA   Gastroesophageal reflux disease   Obesity, morbid (HCC)   Hyperlipidemia   Nonischemic cardiomyopathy (HCC)   Acute CHF (congestive heart failure) (HCC)  Shortness of breath Exertional, progressively worsening over a month. Most likely related to volume overload and not taking his medications as prescribed (Lasix 40 daily rather  than BID, Coureg daily rather than BID). CXR showed cardiomegaly with stable or slightly decreased basilar predominant interstitial densities, which may represent mild edema, atypical infection, or chronic interstitial lung disease. Afebrile, O2 sat 96% on room. No weight loss since admission.  -Albuterol nebs Q4 hours PRN -Continue Lasix 40 mg BID -Follow up ECHO -Daily weights  -Strict I/Os -Telemetry   CHF NICM Most recent ECHO in 03/2015. LVEF: 40-45% much improved from previous ECHO 04/2012  With LVEF: 25-30%. Troponin 0.18-->0.17-->0.19. EKG no acute changes, unchanged nonspecific ST abnormality, possible left atrial enlargement. This EKG is similar to EKG done on 05/2015. Previous admission with similar symptom presentation in 03/2015, he also had elevated Troponin 0.14, 0.13. At that time it was thought to be due to is acute on chronic systolic diastolic heart failure and an outpatient stress test was recommended, but never completed.  -BNP 396.3 -Repeating ECHO, follow up results  -Spironolactone 25 mg daily -Carvedilol 25 mg BID -Aspirin 81 mg daily   Alcohol Abuse Currently not experiencing any withdrawal symptoms.  -CIWA protocol   OSA Most recent sleep study 01/2016 with severe OSA and severe oxygen desturation min O2=66%. Patient does not have CPAP at home.  -CPAP at bedtime  HLD -Losartan 100 mg daily -Fenofibrate 160 mg daily   DVT PPx: Lovenox  Dispo: Anticipated discharge in approximately 1-2 day(s).   Toney Rakes, MD 08/10/2016, 10:35 AM Pager: [phone]

## 2016-08-10 NOTE — Progress Notes (Signed)
  Echocardiogram 2D Echocardiogram has been performed.  Ethan Wilcox 08/10/2016, 11:14 AM

## 2016-08-10 NOTE — Progress Notes (Addendum)
Responded to [facility] consult to assist with AD and  support patient. Provided emotional and spiritual care. Patient indicated that he had no knowledge of what an AD was but took form to let children advise him and if interested he would inform staff to proceed. Venida Jarvis, [place], [facility], Pager [phone]

## 2016-08-10 NOTE — Progress Notes (Signed)
Pt. Placed on CPAP for h/s per home regimen, on room air, humidity filled, tolerating well.

## 2016-08-11 ENCOUNTER — Encounter: Payer: Self-pay | Admitting: Student-PharmD

## 2016-08-11 LAB — BASIC METABOLIC PANEL
ANION GAP: 12 (ref 5–15)
BUN: 13 mg/dL (ref 6–20)
CALCIUM: 9.3 mg/dL (ref 8.9–10.3)
CO2: 26 mmol/L (ref 22–32)
Chloride: 98 mmol/L — ABNORMAL LOW (ref 101–111)
Creatinine, Ser: 1.16 mg/dL (ref 0.61–1.24)
GFR calc non Af Amer: >60 mL/min (ref >60)
Glucose, Bld: 177 mg/dL — ABNORMAL HIGH (ref 65–99)
Potassium: 3.8 mmol/L (ref 3.5–5.1)
SODIUM: 136 mmol/L (ref 135–145)

## 2016-08-11 MED ORDER — FUROSEMIDE 40 MG PO TABS: 40.0000 mg | ORAL_TABLET | Freq: Two times a day (BID) | ORAL | 1 refills | Status: DC

## 2016-08-11 MED ORDER — CARVEDILOL 25 MG PO TABS
25.0000 mg | ORAL_TABLET | Freq: Two times a day (BID) | ORAL | 1 refills | Status: DC
Start: 2016-08-11 — End: 2017-01-08

## 2016-08-11 MED ORDER — POLYETHYLENE GLYCOL 3350 17 GM/SCOOP PO POWD: ORAL | 0 refills | Status: DC

## 2016-08-11 MED FILL — FUROSEMIDE 40 MG TABLET: 40 | 45 days supply | Qty: 90 | Fill #0

## 2016-08-11 MED FILL — CARVEDILOL 25 MG TABLET: 25 | 45 days supply | Qty: 90 | Fill #0

## 2016-08-11 MED FILL — POLYETHYLENE GLYCOL 3350: 15 days supply | Qty: 255 | Fill #1

## 2016-08-11 NOTE — Progress Notes (Unsigned)
Offered help with medication management. Patient states that he is interested in meeting with internal medicine pharmacist post discharge to review medications. Have all prescriptions sent to [facility]. Patient has had difficulty filling Miralax and requested we look into it. Will contact pharmacy and contact patient.  Thomes Cake, Student Pharmacist

## 2016-08-11 NOTE — Progress Notes (Signed)
Patient for discharge home accompanied by his wife with no apparent distress noted. Medications and discharge instructions explained to the patient and he verbalized understanding. Copies given to him. IV saline lock and tele pack removed. Tele monitor notified.

## 2016-08-11 NOTE — Discharge Summary (Signed)
Name: Ethan Wilcox MRN: [id number] DOB: Jan 06, 1956 61 y.o. PCP: [name], MD  Date of Admission: 08/09/2016 10:08 AM Date of Discharge: 08/11/2016 Attending Physician: No att. providers found  Discharge Diagnosis: 1. Acute on chronic CHF  Principal Problem:   Acute CHF (congestive heart failure) (HCC) Active Problems:   Alcohol abuse   HYPERTENSION   Severe OSA   Gastroesophageal reflux disease   Obesity, morbid (HCC)   Hyperlipidemia   Nonischemic cardiomyopathy (HCC)   Discharge Medications: Allergies as of 08/11/2016      Reactions   Lisinopril Anaphylaxis   Possible cause of anaphylaxis episode on 08/24/2012 which required admission.   Shrimp [shellfish Allergy] Anaphylaxis   Penicillins Other (See Comments)   unknowN      Medication List    TAKE these medications   albuterol 108 (90 Base) MCG/ACT inhaler Commonly known as:  PROVENTIL HFA;VENTOLIN HFA Inhale 2 puffs into the lungs every 4 (four) hours as needed for wheezing or shortness of breath.   ASPIR-LOW 81 MG EC tablet Generic drug:  aspirin Take 81 mg by mouth daily.   carvedilol 25 MG tablet Commonly known as:  COREG Take 1 tablet (25 mg total) by mouth 2 (two) times daily with a meal. What changed:  See the new instructions.   COUGH/SORE THROAT LOZENGES MT Use as directed 1-3 drops in the mouth or throat daily as needed (cough).   diphenhydrAMINE 25 MG tablet Commonly known as:  BENADRYL Take 25 mg by mouth every 6 (six) hours as needed for allergies. Reported on 05/24/2015   fenofibrate 145 MG tablet Commonly known as:  TRICOR Take 1 tablet (145 mg total) by mouth daily.   furosemide 40 MG tablet Commonly known as:  LASIX Take 1 tablet (40 mg total) by mouth 2 (two) times daily. What changed:  how much to take   guaifenesin 100 MG/5ML syrup Commonly known as:  ROBITUSSIN Take 200 mg by mouth 3 (three) times daily as needed for cough.   losartan 100 MG tablet Commonly known as:   COZAAR TAKE 1 TABLET (100 MG TOTAL) BY MOUTH DAILY.   multivitamin with minerals Tabs tablet Take 1 tablet by mouth daily.   omeprazole 40 MG capsule Commonly known as:  PRILOSEC TAKE 1 CAPSULE BY MOUTH DAILY   polyethylene glycol powder powder Commonly known as:  GLYCOLAX/MIRALAX Take 17 g of powder with 4-8 ounces of fluid daily for constipation. What changed:  additional instructions   spironolactone 25 MG tablet Commonly known as:  ALDACTONE TAKE 1 TABLET(25 MG) BY MOUTH DAILY            Durable Medical Equipment        Start     Ordered   08/10/16 1419  For home use only DME continuous positive airway pressure (CPAP)  Once    Question Answer Comment  Patient has OSA or probable OSA Yes   Is the patient currently using CPAP in the home No   If no (to question two) date of sleep study 01/14/2016   Settings 16-20   Signs and symptoms of probable OSA  (select all that apply) Witnessed apneas   CPAP supplies needed Mask, headgear, cushions, filters, heated tubing and water chamber      08/10/16 1421      Disposition and follow-up:   Ethan Wilcox was discharged from [facility] in stable condition.  At the hospital follow up visit please address:  1.  Hospital  problems:  -CHF: medication regimen, improvement in shortness of breath, follow up with cardiologist  -OSA: CPAP usage  -Alcohol abuse  2.  Labs / imaging needed at time of follow-up: none  3.  Pending labs/ test needing follow-up: none  Follow-up Appointments: Follow-up Information    [facility] - Dme Follow up.   Why:  CPAP Contact information: [address] [place] [address] [phone]        Laurann Montana, PA-C. Go on 09/05/2016.   Specialties:  Cardiology, Radiology Why:  9:30 AM  Contact information: [address] [place] [address] [phone]           Hospital Course by problem list: Principal  Problem:   Acute CHF (congestive heart failure) (HCC) Active Problems:   Alcohol abuse   HYPERTENSION   Severe OSA   Gastroesophageal reflux disease   Obesity, morbid (HCC)   Hyperlipidemia   Nonischemic cardiomyopathy (HCC)   1. Acute CHF, DOE -Mr. Swoveland was admitted to [facility] and IMTS for progressively worsening shortness of breath. Upon initial presentation, he was experiencing shortness of breath with ambulation of short distances and activities of daily living. He reported that he had been taking his medications, but further investigation revealed that he was not taking the appropriate dose of his CHF medications, lasix and coureg. He was taking less than prescribed. Initial evaluation in the emergency department resulted in a chest xray, EKG, and troponin level. Chest xray showed cardiomegaly with interstitial densities consistent with mild edema. EKG was non-ischemic and unchanged from previous EKG. His troponin level was elevated, but previously obtained troponin levels were also high, so an acute elevation due to ischemia was less likely, and the patient had not experienced any chest pain. He received IV diuretics in the emergency department, as well as throughout his entire hospitalization. A transthoracic echocardiogram was obtained, which showed reduced systolic function with an estimated left ventricular function of 25-30%. He lost a total of five pounds during his hospitalization, and his shortness of breath improved significantly. He was ambulated up and down the hall by nursing, and his oxygen saturations remained normal.  He was discharged home on the correct medication regimen with appropriate follow up appointments.   2. Alcohol Abuse Mr. Gassman also stated that he was drinking three fifths of vodka about 3 times weekly. He did not experience any alcohol withdrawal symptoms during his hospitalization and did not require any Ativan.   3. Obstructive Sleep Apnea Mr. Picotte was  not using a CPAP machine at home because he never received one. His case manager was able to take care of this. A CPAP machine will be delivered to the patient at home.   4. Hypertension Mr. Casimir's blood pressure remained stable throughout hospitalization. He was kept on his home medication, Losartan.   Discharge Vitals:   BP 127/86 (BP Location: Right Arm)   Pulse 98   Temp 97.8 F (36.6 C) (Oral)   Resp 17   Ht 5\' 7"  (1.702 m)   Wt 276 lb 6.4 oz (125.4 kg)   SpO2 96% Comment: 96-100% while ambulating  BMI 43.29 kg/m   Pertinent Labs, Studies, and Procedures:  CMP Latest Ref Rng & Units 08/11/2016 08/10/2016 08/09/2016  Glucose 65 - 99 mg/dL 578(I) 696(E) 952(W)  BUN 6 - 20 mg/dL 13 9 8   Creatinine 0.61 - 1.24 mg/dL 4.13 2.44 0.10  Sodium 135 - 145 mmol/L 136 138 137  Potassium  3.5 - 5.1 mmol/L 3.8 3.8 4.1  Chloride 101 - 111 mmol/L 98(L) 100(L) 99(L)  CO2 22 - 32 mmol/L 26 29 26   Calcium 8.9 - 10.3 mg/dL 9.3 8.9 8.1(X)  Total Protein 6.5 - 8.1 g/dL - 6.9 -  Total Bilirubin 0.3 - 1.2 mg/dL - 1.2 -  Alkaline Phos 38 - 126 U/L - 92 -  AST 15 - 41 U/L - 21 -  ALT 17 - 63 U/L - 24 -   CBC Latest Ref Rng & Units 08/10/2016 08/09/2016 08/08/2016  WBC 4.0 - 10.5 K/uL 9.5 9.6 8.9  Hemoglobin 13.0 - 17.0 g/dL 91.4 78.2 95.6  Hematocrit 39.0 - 52.0 % 42.4 39.4 39.6  Platelets 150 - 400 K/uL 278 272 275   Transthoracic Echocardiogram Study Conclusions - Left ventricle: The cavity size was normal. There was moderate   concentric hypertrophy. Systolic function was severely reduced.   The estimated ejection fraction was in the range of 25% to 30%.   Wall motion was normal; there were no regional wall motion   abnormalities. Doppler parameters are consistent with abnormal   left ventricular relaxation (grade 1 diastolic dysfunction).   Doppler parameters are consistent with indeterminate ventricular   filling pressure. - Aortic valve: Transvalvular velocity was within the normal range.    There was no stenosis. There was no regurgitation. - Mitral valve: Transvalvular velocity was within the normal range.   There was no evidence for stenosis. There was trivial   regurgitation. - Left atrium: The atrium was moderately dilated. - Right ventricle: The cavity size was normal. Wall thickness was   normal. Systolic function was normal. - Tricuspid valve: There was no regurgitation.  Discharge Instructions: Discharge Instructions    Diet - low sodium heart healthy    Complete by:  As directed    Discharge instructions    Complete by:  As directed    Please continue your current medications. Please take Lasix 40 mg TWICE DAILY. Please take Coureg 25 mg TWICE DAILY.   Please follow up with the Internal Medicine Center at [facility] on August 10th, at 2:15 PM. At that time you should be able to get a referral to your Cardiologist Dr. Shirlee Latch. If not, you have a Cardiology appointment scheduled on August 28th at 9:30 AM (please cancel if you do not need this appointment).   Increase activity slowly    Complete by:  As directed       Signed: Toney Rakes, MD 08/11/2016, 3:37 PM   Pager: [phone]

## 2016-08-11 NOTE — Progress Notes (Signed)
Pt is not ready to be placed on machine at this time.  Pt states that he will call when he is ready.

## 2016-08-11 NOTE — Progress Notes (Signed)
Ambulate patient in the hallway on RA. O2 sat maintains at 96-100%. He denies SOB or any discomfort.

## 2016-08-11 NOTE — Progress Notes (Signed)
Subjective: Patient states that his shortness of breath on exertion has much improved this morning. He is able to walk to the bathroom without any SOB. He denies any chest pain. The patient states he feels well and wants to go home.   Objective:  Vital signs in last 24 hours: Vitals:   08/11/16 0000 08/11/16 0408 08/11/16 0830 08/11/16 0835  BP: 111/80 110/74 127/86   Pulse: 96 100 98   Resp:  17    Temp:  97.8 F (36.6 C)    TempSrc:  Oral    SpO2:  93% 99% 96%  Weight:  276 lb 6.4 oz (125.4 kg)    Height:       Physical Exam  Constitutional: He is oriented to person, place, and time. He appears well-developed and well-nourished. No distress.  HENT:  Head: Normocephalic and atraumatic.  Eyes: Conjunctivae and EOM are normal.  Neck: Normal range of motion. Neck supple.  Cardiovascular: Normal rate, regular rhythm, normal heart sounds and intact distal pulses.  Exam reveals no gallop and no friction rub.   No murmur heard. Pulmonary/Chest: Effort normal and breath sounds normal.  Abdominal: Soft. Bowel sounds are normal. There is no tenderness.  Musculoskeletal: Normal range of motion. He exhibits no edema.  Neurological: He is alert and oriented to person, place, and time.  Skin: Skin is warm and dry.  Psychiatric: He has a normal mood and affect. His behavior is normal.    Assessment/Plan:  Active Problems:   Alcohol abuse   HYPERTENSION   Severe OSA   Gastroesophageal reflux disease   Obesity, morbid (HCC)   Hyperlipidemia   Nonischemic cardiomyopathy (HCC)   Acute CHF (congestive heart failure) (HCC)  Shortness of breath Exertional, progressively worsening over a month. Most likely related to volume overload and not taking his medications as prescribed (Lasix 40 daily rather than BID, Coureg daily rather than BID). CXR showed cardiomegaly with stable or slightly decreased basilar predominant interstitial densities, which may represent mild edema, atypical  infection, or chronic interstitial lung disease. SOB improved today, ambulatory O2 sat 96-100% with no SOB or CP. 5 lb weight loss since admission. Will be discharged today.  -Continue Lasix 40 mg BID at home   CHF NICM Elevated Troponin ECHO completed and showed systolic function was severely reduced, LVEF 25-30%. ECHO in 03/2015. LVEF: 40-45% much improved from previous ECHO 04/2012 with LVEF 25-30%, and 05/2009 15-20%. The decreased in LVEF may be attributed to medication non-compliance and alcohol abuse. Troponin 0.18-->0.17-->0.19. EKG no acute changes, unchanged nonspecific ST abnormality, possible left atrial enlargement. This EKG is similar to EKG done on 05/2015. Previous admission with similar symptom presentation in 03/2015, he also had elevated Troponin 0.14, 0.13.  At that time it was thought to be due to is acute on chronic systolic diastolic heart failure and an outpatient stress test was recommended, but never completed. The chronically elevated troponin in this patient may be due to his  congestive heart failure. Volume and pressure overload of both the right and left ventricle can produce excessive wall tension, resulting in myocardial strain and subsequent myofibrillar damage.    -Outpatient follow up with cardiologist, Dr. Shirlee Latch. Last visit 05/2015. -Continue current medication regimen:  -Spironolactone 25 mg daily  -Carvedilol 25 mg BID  -Lasix 40 mg BID  -Aspirin 81 mg daily    Alcohol Abuse Currently not experiencing any withdrawal symptoms. Did not experience any symptoms through out his admission. Encouraged him to decrease his alcohol  intake.    OSA Most recent sleep study 01/2016 with severe OSA and severe oxygen desturation min O2=66%. Patient does not have CPAP at home.  -Referal in progress -CPAP at bedtime  HLD -Losartan 100 mg daily -Fenofibrate 160 mg daily   DVT PPx: Lovenox  Dispo: Anticipated discharge today.  Toney Rakes, MD 08/11/2016,  11:14 AM Pager: [phone]

## 2016-08-11 NOTE — Care Management Note (Addendum)
Case Management Note  Patient Details  Name: Ethan Wilcox MRN: [id number] Date of Birth: 1955-07-28  Subjective/Objective:                 Spoke w Lupita Leash, clinical liaison [facility], referral in progress for CPAP, orders in this AM. DME to be delivered today if no additional notes needed, Lupita Leash will advise if they are. No other home needs identified.    Action/Plan:  DC to home today. CPAP will be delivered to patient, no other documentation needed.    Expected Discharge Date:                  Expected Discharge Plan:  Home/Self Care  In-House Referral:  NA  Discharge planning Services  CM Consult  Post Acute Care Choice:  Durable Medical Equipment Choice offered to:  Patient  DME Arranged:  Continuous positive airway pressure (CPAP) DME Agency:  [facility]  HH Arranged:  NA HH Agency:  NA  Status of Service:  Completed, signed off  If discussed at Long Length of Stay Meetings, dates discussed:    Additional Comments:  Lawerance Sabal, RN 08/11/2016, 10:04 AM

## 2016-08-11 NOTE — Progress Notes (Signed)
Patient was discharged home. He denies any pain or discomfort.

## 2016-08-12 LAB — HEMOGLOBIN A1C
HEMOGLOBIN A1C: 7.2 % — AB (ref 4.8–5.6)
MEAN PLASMA GLUCOSE: 160 mg/dL

## 2016-08-14 ENCOUNTER — Other Ambulatory Visit (HOSPITAL_COMMUNITY): Payer: Self-pay | Admitting: Cardiology

## 2016-08-14 MED ORDER — LOSARTAN POTASSIUM 100 MG PO TABS: 100.0000 mg | ORAL_TABLET | Freq: Every day | ORAL | 1 refills | Status: DC

## 2016-08-18 ENCOUNTER — Ambulatory Visit: Payer: Medicaid Other

## 2016-08-24 ENCOUNTER — Other Ambulatory Visit: Payer: Self-pay | Admitting: Internal Medicine

## 2016-09-04 ENCOUNTER — Encounter: Payer: Self-pay | Admitting: Physician Assistant

## 2016-09-04 NOTE — Progress Notes (Deleted)
Cardiology Office Note    Date:  09/04/2016  ID:  Ethan, [name] [birth year], MRN [id number] PCP:  Ethan Lever, MD  Cardiologist:  Previously Dr. Shirlee Wilcox   Chief Complaint: f/u CHF  History of Present Illness:  Ethan Wilcox is a 61 y.o. male with history of NICM/chronic combined CHF, alcohol abuse, HTN, morbid obesity, OSA, HLD, prior cocaine abuse, tobacco, HCV who presents to re-establish cardiology care.  Ethan Wilcox had a prolonged admission in 5/11 for CHF. Echo showed EF 15-20% with diffuse hypokinesis. Left and right heart cath showed no angiographic CAD and Fick CI 1.47. He became hypotensive with ARF (cardiogenic shock) requiring milrinone and dopamine. He was gradually titrated off these medications and begun on an oral regimen. He saw Dr. Ladona Wilcox regarding an ICD but actually at that time admitted to resuming ETOH intake so ICD was not pursued. 2D Echo in 5/15 showed EF 30-35%, diffuse hypokinesis.  He also previously had a hospitalization with angioedema. This actually may have been related to eating shellfish but lisinopril was stopped. He was unable to tolerate Bidil due to headaches. He was last seen by Dr. Shirlee Wilcox in 05/2015. More recently he was admitted 8/1-08/11/16 with CHF by the teaching service. He was treated with IV diuretics. Labs revealed K 3.8, Cr 1.16, Hgb 14.5, LFTs wnl, troponin 0.19 felt c/w prior (chronically elevated), UDS + THC (neg cocaine). 2D echo 08/10/16 showed EF 25-30%, grade 1 DD, mod LAE, normal RV. He also reported never receiving CPAP so care management was set up to assist with that. DC weight ***.  Chronic combined CHF/NICM Elevated troponin Alcohol abuse Essential HTN OSA      Past Medical History:  Diagnosis Date  . Active smoker /   1/2 ppd  . Chronic combined systolic and diastolic CHF (congestive heart failure) (HCC)   . Cocaine abuse   . ETOH abuse   . GERD (gastroesophageal reflux disease)   . Gout   . HCV (hepatitis C virus)     . Hx of anaphylaxis with angioedema 08/23/2012   Admitted with angioedema on 08/24/2012 No intubation Unknown allergen Shrimp Vs Spices Vs ACEis Stopped Lisinopril   . Hypertension   . Morbid obesity (HCC)   . Nonischemic cardiomyopathy (HCC)    admitted 5/11 w acute systolic CHF. LHC showed no angiographic CAD. Echo (5/11) showed EF 15-20% w diffuse sever hypokinesis, moderate MR> RHC showed PCWP 10, CI 1.47 (Fick.) cause of CMP thought to be heavy ETOH and cocaine. No ICD due to ongoing ETOH intake  . Sleep apnea     Past Surgical History:  Procedure Laterality Date  . NO PAST SURGERIES      Current Medications: No outpatient prescriptions have been marked as taking for the 09/05/16 encounter (Appointment) with Ethan Montana, PA-C.     Allergies:   Lisinopril; Shrimp [shellfish allergy]; and Penicillins   Social History   Social History  . Marital status: Single    Spouse name: N/A  . Number of children: N/A  . Years of education: N/A   Occupational History  . DISABLED Unemployed   Social History Main Topics  . Smoking status: Current Every Day Smoker    Packs/day: 0.75    Years: 38.00    Types: Cigarettes    Start date: 01/10/1972  . Smokeless tobacco: Current User     Comment: down to .5 pk.  about 1 week ago.  Stopped 2.5 weeks ago  . Alcohol use 7.2  oz/week    12 Shots of liquor per week     Comment: Vodka 1/2 pint - on/off.  . Drug use: No     Comment: none in over 20 yrs per patient  . Sexual activity: Not Currently   Other Topics Concern  . Not on file   Social History Narrative   Works as Gaffer. 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 Heavy ETOH in past, now back to drinking 1/2 pint/week.          No show 07/16/09     Family History:  Family History  Problem Relation Age of Onset  . Heart disease Mother   . Cancer Mother   . Heart disease Father    ***  ROS:   Please see the history of present illness. Otherwise, review of systems is positive for  ***.  All other systems are reviewed and otherwise negative.    PHYSICAL EXAM:   VS:  There were no vitals taken for this visit.  BMI: There is no height or weight on file to calculate BMI. GEN: Well nourished, well developed, in no acute distress  HEENT: normocephalic, atraumatic Neck: no JVD, carotid bruits, or masses Cardiac: ***RRR; no murmurs, rubs, or gallops, no edema  Respiratory:  clear to auscultation bilaterally, normal work of breathing GI: soft, nontender, nondistended, + BS MS: no deformity or atrophy  Skin: warm and dry, no rash Neuro:  Alert and Oriented x 3, Strength and sensation are intact, follows commands Psych: euthymic mood, full affect  Wt Readings from Last 3 Encounters:  08/11/16 276 lb 6.4 oz (125.4 kg)  08/08/16 295 lb (133.8 kg)  05/01/16 291 lb 9.6 oz (132.3 kg)      Studies/Labs Reviewed:   EKG:  EKG was ordered today and personally reviewed by me and demonstrates *** EKG was not ordered today.***  Recent Labs: 05/01/2016: TSH 0.878 08/09/2016: B Natriuretic Peptide 396.3 08/10/2016: ALT 24; Hemoglobin 14.5; Platelets 278 08/11/2016: BUN 13; Creatinine, Ser 1.16; Potassium 3.8; Sodium 136   Lipid Panel    Component Value Date/Time   CHOL 140 05/28/2015 1006   TRIG 521 (H) 05/28/2015 1006   HDL 27 (L) 05/28/2015 1006   CHOLHDL 5.2 05/28/2015 1006   VLDL UNABLE TO CALCULATE IF TRIGLYCERIDE OVER 400 mg/dL 67/59/1638 4665   LDLCALC UNABLE TO CALCULATE IF TRIGLYCERIDE OVER 400 mg/dL 99/35/7017 7939   LDLDIRECT 35 07/22/2014 0246    Additional studies/ records that were reviewed today include: Summarized above.***    ASSESSMENT & PLAN:   1. ***  Disposition: F/u with ***   Medication Adjustments/Labs and Tests Ordered: Current medicines are reviewed at length with the patient today.  Concerns regarding medicines are outlined above. Medication changes, Labs and Tests ordered today are summarized above and listed in the Patient Instructions  accessible in Encounters.   Signed, Ethan Montana, PA-C  09/04/2016 5:08 PM    [facility] [address] [place], [place], [place]  [address] Phone: [phone]; Fax: [phone]

## 2016-09-05 ENCOUNTER — Ambulatory Visit: Payer: Medicaid Other | Admitting: Physician Assistant

## 2016-09-06 ENCOUNTER — Encounter: Payer: Self-pay | Admitting: Physician Assistant

## 2016-09-08 MED FILL — POLYETHYLENE GLYCOL 3350: 15 days supply | Qty: 255 | Fill #2

## 2016-09-28 NOTE — Telephone Encounter (Signed)
Patient has never returned phone call to schedule. Records will be shredded. °

## 2016-10-02 MED FILL — POLYETHYLENE GLYCOL 3350: 15 days supply | Qty: 255 | Fill #3

## 2016-10-03 ENCOUNTER — Other Ambulatory Visit: Payer: Self-pay | Admitting: *Deleted

## 2016-10-03 ENCOUNTER — Telehealth: Payer: Self-pay | Admitting: *Deleted

## 2016-10-03 ENCOUNTER — Other Ambulatory Visit: Payer: Self-pay | Admitting: Internal Medicine

## 2016-10-03 DIAGNOSIS — I5022 Chronic systolic (congestive) heart failure: Secondary | ICD-10-CM

## 2016-10-03 MED FILL — FUROSEMIDE 40 MG TABLET: 40 | 90 days supply | Qty: 180 | Fill #0

## 2016-10-03 MED FILL — CARVEDILOL 25 MG TABLET: 25 | 90 days supply | Qty: 180 | Fill #0

## 2016-10-03 NOTE — Telephone Encounter (Signed)
To clarify the prescription was changed accordingly or you need me to re-order the prescription?

## 2016-10-03 NOTE — Telephone Encounter (Signed)
Pt called stating his meds, carvedilol and furosemide were "messed up" at the pharm, called op pharm, changed the amts, both should have been #180 for a 3 month supply not #90, spoke w/ elizabeth at [facility]. Could you please change the medlist

## 2016-10-05 NOTE — Telephone Encounter (Signed)
Lm again

## 2016-10-05 NOTE — Telephone Encounter (Signed)
Message below for another pt

## 2016-10-06 MED ORDER — FUROSEMIDE 40 MG PO TABS: 40.0000 mg | ORAL_TABLET | Freq: Two times a day (BID) | ORAL | 1 refills | Status: DC

## 2016-10-06 MED ORDER — LOSARTAN POTASSIUM 100 MG PO TABS: 100.0000 mg | ORAL_TABLET | Freq: Every day | ORAL | 1 refills | Status: DC

## 2016-10-10 ENCOUNTER — Encounter (INDEPENDENT_AMBULATORY_CARE_PROVIDER_SITE_OTHER): Payer: Self-pay

## 2016-10-10 ENCOUNTER — Ambulatory Visit (INDEPENDENT_AMBULATORY_CARE_PROVIDER_SITE_OTHER): Payer: Medicaid Other | Admitting: Internal Medicine

## 2016-10-10 ENCOUNTER — Encounter: Payer: Self-pay | Admitting: Internal Medicine

## 2016-10-10 VITALS — BP 129/80 | HR 84 | Temp 98.1°F | Ht 67.0 in | Wt 288.3 lb

## 2016-10-10 DIAGNOSIS — R739 Hyperglycemia, unspecified: Secondary | ICD-10-CM | POA: Diagnosis not present

## 2016-10-10 DIAGNOSIS — I110 Hypertensive heart disease with heart failure: Secondary | ICD-10-CM | POA: Diagnosis not present

## 2016-10-10 DIAGNOSIS — F17200 Nicotine dependence, unspecified, uncomplicated: Secondary | ICD-10-CM | POA: Diagnosis not present

## 2016-10-10 DIAGNOSIS — K7400 Hepatic fibrosis, unspecified: Secondary | ICD-10-CM

## 2016-10-10 DIAGNOSIS — I5022 Chronic systolic (congestive) heart failure: Secondary | ICD-10-CM | POA: Diagnosis not present

## 2016-10-10 DIAGNOSIS — K219 Gastro-esophageal reflux disease without esophagitis: Secondary | ICD-10-CM | POA: Diagnosis not present

## 2016-10-10 DIAGNOSIS — F1010 Alcohol abuse, uncomplicated: Secondary | ICD-10-CM

## 2016-10-10 DIAGNOSIS — Z0000 Encounter for general adult medical examination without abnormal findings: Secondary | ICD-10-CM

## 2016-10-10 DIAGNOSIS — G4733 Obstructive sleep apnea (adult) (pediatric): Secondary | ICD-10-CM

## 2016-10-10 DIAGNOSIS — I10 Essential (primary) hypertension: Secondary | ICD-10-CM

## 2016-10-10 MED ORDER — LOSARTAN POTASSIUM 100 MG PO TABS: 100.0000 mg | ORAL_TABLET | Freq: Every day | ORAL | 1 refills | Status: DC

## 2016-10-10 MED ORDER — POLYETHYLENE GLYCOL 3350 17 GM/SCOOP PO POWD: ORAL | 3 refills | Status: AC

## 2016-10-10 MED ORDER — NICOTINE 14 MG/24HR TD PT24: 14.0000 mg | MEDICATED_PATCH | TRANSDERMAL | 0 refills | Status: DC

## 2016-10-10 MED ORDER — BUPROPION HCL ER (SR) 150 MG PO TB12: ORAL_TABLET | ORAL | 1 refills | Status: DC

## 2016-10-10 MED FILL — BUPROPION SR 150 MG TABLET: 150 | 30 days supply | Qty: 60 | Fill #0

## 2016-10-10 MED FILL — NICOTINE 14 MG/24HR PATCH: 14 | 28 days supply | Qty: 28 | Fill #0

## 2016-10-10 NOTE — Assessment & Plan Note (Signed)
Patient still only uses CPAP intermittently, he says he feels tired when not using it- I explained the correlation between wearing cpap, and feeling less tired  Plan -advised daily cpap use

## 2016-10-10 NOTE — Progress Notes (Signed)
    CC: HTN, CHF, OSA, GERD, HM, smoking, hyperglycemia, alcohol use  HPI: [name] is a 61 y.o. man with PMH noted below here for HTN, CHF, OSA, GERD, HM, smoking, hyperglycemia, alcohol use    Please see Problem List/A&P for the status of the patient's chronic medical problems   Past Medical History:  Diagnosis Date  . Active smoker /   1/2 ppd  . Chronic combined systolic and diastolic CHF (congestive heart failure) (HCC)   . Cocaine abuse (HCC)   . ETOH abuse   . GERD (gastroesophageal reflux disease)   . Gout   . HCV (hepatitis C virus)   . Hx of anaphylaxis with angioedema 08/23/2012   Admitted with angioedema on 08/24/2012 No intubation Unknown allergen Shrimp Vs Spices Vs ACEis Stopped Lisinopril   . Hypertension   . Morbid obesity (HCC)   . Nonischemic cardiomyopathy (HCC)    admitted 5/11 w acute systolic CHF. LHC showed no angiographic CAD. Echo (5/11) showed EF 15-20% w diffuse sever hypokinesis, moderate MR> RHC showed PCWP 10, CI 1.47 (Fick.) cause of CMP thought to be heavy ETOH and cocaine. No ICD due to ongoing ETOH intake  . Sleep apnea     Review of Systems: Constitutional: Negative for fever, chills, weight loss and malaise/fatigue.  HEENT: No headaches, vision problems, cough, hearing problems  Respiratory: Negative for cough, shortness of breath and wheezing.  Cardiovascular: Negative for chest pain, orthopnea, or PND   Gastrointestinal: Negative for heartburn, nausea, vomiting, abdominal pain, diarrhea and constipation. No hematochezia, or melena Denies polyuria, polydipsia  Physical Exam: Vitals:   10/10/16 1532  BP: 129/80  Pulse: 84  Temp: 98.1 F (36.7 C)  TempSrc: Oral  SpO2: 100%  Weight: 288 lb 4.8 oz (130.8 kg)  Height: 5\' 7"  (1.702 m)    General: A&O, in NAD, obese Neck: supple, midline trachea,   CV: RRR, normal s1, s2, no m/r/g, no carotid bruits appreciated Resp: equal and symmetric breath sounds, no wheezing or crackles   Abdomen: soft, nontender, nondistended, +BS, obese Skin: warm, dry, intact, no open lesions or rashes noted Extremities:no edema   Assessment & Plan:   See encounters tab for problem based medical decision making. Patient discussed with Dr Rogelia Boga

## 2016-10-10 NOTE — Assessment & Plan Note (Signed)
BP Readings from Last 3 Encounters:  10/10/16 129/80  08/11/16 127/86  08/08/16 (!) 130/106   Patient is compliant with his coreg, losartan, spiro, and lasix 40 mg BID. His blood pressure is at goal  Plan -continue above agents -follow up in 3 months

## 2016-10-10 NOTE — Assessment & Plan Note (Signed)
Lab Results  Component Value Date   HGBA1C 7.2 (H) 08/11/2016   HGBA1C 6.2 12/09/2014   HGBA1C 7.0 07/21/2014    Patient's most recent A1c is 7.2 in August. He is asymptomatic- denies polyuria, polydipsia, etc. Though his increased BMI does put him at increased risk for metabolic syndrome. We looked up the screening guidelines and we can not diagnose him as having diabetes yet.  Plan -will repeat a1c at next visit

## 2016-10-10 NOTE — Patient Instructions (Signed)
Thank you for your visit today Please start the Wellbutrin 7 days before your quit date and follow the instructions.  Please follow up with your heart doctor. Please wear your CPAP daily Please follow up here in 3 months

## 2016-10-10 NOTE — Assessment & Plan Note (Addendum)
Patient had no signs of cirrhosis today- no ascites. Most recent platelets are normal, as well as normal LFTs and synthetic function. [place] elastography done recently in Aug 2017  does show some fibrosis, but I do not think this patient has active cirrhosis.  Will try to obtain records from Dr Madilyn Fireman if he has new information   Plan -continue to monitor

## 2016-10-10 NOTE — Assessment & Plan Note (Signed)
Patient declined flu vaccine today.

## 2016-10-10 NOTE — Assessment & Plan Note (Signed)
Patient has cut down his alcohol use to one fifth , once a week  Plan -I advised continued efforts to cut down

## 2016-10-10 NOTE — Assessment & Plan Note (Signed)
This problem is chronic and stable and is treated by omeprazole daily  Plan -continue omeprazole daily

## 2016-10-10 NOTE — Assessment & Plan Note (Signed)
Patient is now interested in quitting smoking. He currently smokes 0.5 ppd, with 20-30 year smoking history.  He said he tried chantix in the past but he was not able to tolerate it. He denies any history of seizures, and so wellbutrin is a good choice as he is willing to try it.  Plan -Prescribed wellbutrin 150 mg daily for 3 days, then 150 mg BID thereafter, to start 7 days before quit day -prescribed nicotine patch

## 2016-10-10 NOTE — Assessment & Plan Note (Signed)
Patient was recently hospitalized for heart failure exacerbation and most recent echo showing EF of 25-30%. NICM attributed to alcohol and cocaine use. He is on max medical therapy of coreg, losartan, spiro, and lasix. He has been following with heart failure clinic in the past. He denies symptoms of HF today and is compliant with meds. Denies weight gain.   Plan -continue current meds -follow up with HF- may be candidate for ICD now given his EF of 25-30%

## 2016-10-11 NOTE — Progress Notes (Signed)
Internal Medicine Clinic Attending  Case discussed with Dr. Saraiya at the time of the visit.  We reviewed the resident's history and exam and pertinent patient test results.  I agree with the assessment, diagnosis, and plan of care documented in the resident's note.  

## 2016-10-19 MED FILL — POLYETHYLENE GLYCOL 3350: 15 days supply | Qty: 255 | Fill #4

## 2016-10-19 MED FILL — LOSARTAN POTASSIUM 100 MG T: 100 | 90 days supply | Qty: 90 | Fill #1

## 2016-11-01 MED FILL — OMEPRAZOLE DR 40 MG CAPSULE: 40 | 90 days supply | Qty: 90 | Fill #1

## 2016-11-01 MED FILL — POLYETHYLENE GLYCOL 3350: 15 days supply | Qty: 255 | Fill #0

## 2017-01-08 ENCOUNTER — Other Ambulatory Visit: Payer: Self-pay | Admitting: Internal Medicine

## 2017-01-13 ENCOUNTER — Other Ambulatory Visit: Payer: Self-pay | Admitting: Internal Medicine

## 2017-01-15 MED FILL — LOSARTAN POTASSIUM 100 MG T: 100 | 90 days supply | Qty: 90 | Fill #0

## 2017-01-15 NOTE — Telephone Encounter (Signed)
Pt needs next pcp appt (with me) Giving 3 month supply thanks

## 2017-01-19 ENCOUNTER — Other Ambulatory Visit (HOSPITAL_COMMUNITY): Payer: Self-pay | Admitting: *Deleted

## 2017-01-19 MED FILL — CARVEDILOL 25 MG TABLET: 25 | 90 days supply | Qty: 180 | Fill #0

## 2017-01-19 MED FILL — FUROSEMIDE 40 MG TAB: 40 | 30 days supply | Qty: 60 | Fill #0

## 2017-02-05 ENCOUNTER — Encounter: Payer: Self-pay | Admitting: *Deleted

## 2017-02-08 MED FILL — OMEPRAZOLE DR 40 MG CAPSULE: 40 | 90 days supply | Qty: 90 | Fill #2

## 2017-02-16 ENCOUNTER — Telehealth: Payer: Self-pay | Admitting: Cardiology

## 2017-02-16 ENCOUNTER — Other Ambulatory Visit: Payer: Self-pay

## 2017-02-16 ENCOUNTER — Ambulatory Visit (HOSPITAL_COMMUNITY)
Admission: RE | Admit: 2017-02-16 | Discharge: 2017-02-16 | Disposition: A | Discharge status: Home / Self Care | Payer: Medicaid Other | Source: Ambulatory Visit | Attending: Cardiology | Admitting: Cardiology

## 2017-02-16 VITALS — BP 114/84 | HR 50 | Wt 279.5 lb

## 2017-02-16 DIAGNOSIS — Z79899 Other long term (current) drug therapy: Secondary | ICD-10-CM | POA: Diagnosis not present

## 2017-02-16 DIAGNOSIS — I5022 Chronic systolic (congestive) heart failure: Secondary | ICD-10-CM | POA: Diagnosis not present

## 2017-02-16 DIAGNOSIS — F17210 Nicotine dependence, cigarettes, uncomplicated: Secondary | ICD-10-CM | POA: Insufficient documentation

## 2017-02-16 DIAGNOSIS — Z7982 Long term (current) use of aspirin: Secondary | ICD-10-CM | POA: Insufficient documentation

## 2017-02-16 DIAGNOSIS — Z5189 Encounter for other specified aftercare: Secondary | ICD-10-CM | POA: Diagnosis not present

## 2017-02-16 DIAGNOSIS — G4733 Obstructive sleep apnea (adult) (pediatric): Secondary | ICD-10-CM | POA: Insufficient documentation

## 2017-02-16 DIAGNOSIS — I429 Cardiomyopathy, unspecified: Secondary | ICD-10-CM | POA: Insufficient documentation

## 2017-02-16 LAB — BASIC METABOLIC PANEL
Anion gap: 14 (ref 5–15)
BUN: 15 mg/dL (ref 6–20)
CO2: 23 mmol/L (ref 22–32)
CREATININE: 1.1 mg/dL (ref 0.61–1.24)
Calcium: 9.2 mg/dL (ref 8.9–10.3)
Chloride: 102 mmol/L (ref 101–111)
GFR calc Af Amer: >60 mL/min (ref >60)
GFR calc non Af Amer: >60 mL/min (ref >60)
Glucose, Bld: 125 mg/dL — ABNORMAL HIGH (ref 65–99)
POTASSIUM: 5 mmol/L (ref 3.5–5.1)
Sodium: 139 mmol/L (ref 135–145)

## 2017-02-16 MED ORDER — BUPROPION HCL 75 MG PO TABS: 75.0000 mg | ORAL_TABLET | Freq: Two times a day (BID) | ORAL | 3 refills | Status: DC

## 2017-02-16 MED ORDER — FUROSEMIDE 40 MG PO TABS: ORAL_TABLET | ORAL | 3 refills | Status: DC

## 2017-02-16 MED FILL — FUROSEMIDE 40 MG TAB: 40 | 30 days supply | Qty: 75 | Fill #0

## 2017-02-16 MED FILL — buPROPion HCL 75 MG TABS: 75 | 30 days supply | Qty: 60 | Fill #0

## 2017-02-16 NOTE — Patient Instructions (Addendum)
Start Wellbutrin 75 mg (1 tab), twice a day  Increase Furosemide 60 mg (1.5 tabs) in AM, then 40 mg (1 tab) in PM  Your physician has requested that you have a cardiac MRI. Cardiac MRI uses a computer to create images of your heart as its beating, producing both still and moving pictures of your heart and major blood vessels. For further information please visit [email or web address]. Please follow the instruction sheet given to you today for more information. (they will call you)  Labs drawn today (if we do not call you, then your lab work was stable)   Your physician recommends that you return for lab work in: 10 days  Your physician recommends that you schedule a follow-up appointment in: 6 weeks with NP/AP

## 2017-02-16 NOTE — Telephone Encounter (Signed)
Called patient and LVM to call me with the time of day he would like to have his cardiac MRI.

## 2017-02-18 NOTE — Progress Notes (Signed)
Patient ID: Ethan Wilcox, male   DOB: 07/19/1955, 62 y.o.   MRN: [id number]   PCP: Eulah Pont, MD Cardiology: Dr. Shirlee Latch  62 y.o. with history of nonischemic cardiomyopathy presents for cardiology followup. Ethan Wilcox had a prolonged admission in 5/11 for CHF. Echo showed EF 15-20% with diffuse hypokinesis. Left and right heart cath showed no angiographic CAD and Fick CI 1.47. Patient became hypotensive with ARF (cardiogenic shock) requiring milrinone and dopamine. He was gradually titrated off these medications and begun on an oral regimen.  He saw Dr. Ladona Ridgel regarding an ICD but actually at that time admitted to resuming ETOH intake. Therefore, no ICD placed.  Echo in 5/15 showed EF 30-35%, diffuse hypokinesis.  He has had a hospitalization with angioedema.  This actually may have been related to eating shellfish but lisinopril was stopped.  He was admitted for a CHF exacerbation in 8/18.   He returns for followup of CHF.  I have not seen him for over a year now.  Weight has trended up.  Still drinking ETOH but not heavily anymore.  Still smoking 1/2 ppd.  He failed Chantix due to worsening depression.  He is short of breath walking 100-200 feet.  No PND.  No chest pain.  He has OSA but is not using CPAP.   Labs (5/11): K 4.4, creatinine 1.23, LDL 104, HDL 33  Labs (6/11): BNP 97, K 4.8, creatinine 1.4  Labs (7/11): K 4.8, creatinine 1.0, BNP 53  Labs (10/11): BNP 25, K 4.8 => 4.3, creatinine 1.4 => 1.3  Labs (12/11): BNP 25, K 4.9, creatinine 1.2  Labs (3/12): K 4.3, creatinine 1.2 Labs (11/12): K 4, creatinine 1.3, BNP 82, TGs 518, HDL 39, HDL 60 Labs (2/13): AST 72, ALT 96 Labs (4/13): K 4.4, creatinine 0.95, proBNP 185 Labs (5/13): K 4.3, creatinine 1.2, BNP 32 Labs (7/13): K 4.1, creatinine 1.2, BNP 15 Labs (8/13): K 4.6, creatinine 1.0 Labs (11/13): K 3.9, creatinine 1.1, BNP 32 Labs (4/14): K 4.8, creatinine 1.2, BNP 28 Labs (8/14): K 4.1, creatinine 1.0 Labs (2/15): K 4.1,  creatinine 1.09 Labs (3/15): K 3.8, creatinine 1.1 Labs (8/15): K 3.5, creatinine 1.1 Labs (4/17): K 4.5, creatinine 1.36 Labs (8/18): K 3.8, creatinine 1.16  Allergies (verified):  1) ! Penicillin   Past Medical History:  1. Nonischemic Cardiomyopathy: Admitted 5/11 with acute systolic CHF. LHC showed no angiographic CAD. Echo (5/11) showed EF 15-20% with diffuse severe hypokinesis, moderate MR. RHC showed PCWP 10, CI 1.47 (Fick).  Echo (4/14) with EF 35%, diffuse hypokinesis.  Echo (5/15) with EF 30-35%, mild LV dilation, mild MR.  Cause of CMP thought to be heavy ETOH and cocaine. No ICD yet due to ongoing ETOH intake.  - Echo (3/17) with EF 40-45%, diffuse hypokinesis, mild MR.  - Unable to tolerate Bidil due to headaches. - Echo (8/18): EF 25-30%, diffuse hypokinesis.  2. ETOH abuse 3. Cocaine abuse: Quit in 2011  4. Active smoker: 1/2 ppd  5. Obesity  6. OSA: Severe. Not using CPAP.  7. Angioedema: ? ACEI or shellfish related.   8. 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 Heavy ETOH in the past, now drinking around 1/2 pint liquor daily.    ROS: All systems reviewed and negative except as per HPI.    Current Outpatient Medications  Medication Sig Dispense Refill  . albuterol (PROVENTIL HFA;VENTOLIN  HFA) 108 (90 Base) MCG/ACT inhaler Inhale 2 puffs into the lungs every 4 (four) hours as needed for wheezing or shortness of breath. 1 Inhaler 1  . aspirin (ASPIR-LOW) 81 MG EC tablet Take 81 mg by mouth daily.     . carvedilol (COREG) 25 MG tablet TAKE 1 TABLET BY MOUTH TWICE A DAY 180 tablet 1  . Dextromethorphan-Benzocaine (COUGH/SORE THROAT LOZENGES MT) Use as directed 1-3 drops in the mouth or throat daily as needed (cough).    . diphenhydrAMINE (BENADRYL) 25 MG tablet Take 25 mg by mouth every 6 (six) hours as needed for allergies. Reported on 05/24/2015    . fenofibrate (TRICOR)  145 MG tablet Take 1 tablet (145 mg total) by mouth daily. 30 tablet 6  . furosemide (LASIX) 40 MG tablet Take 60 mg (1.5 tabs) in AM, then 40 mg (1 tab) in  PM 75 tablet 3  . guaifenesin (ROBITUSSIN) 100 MG/5ML syrup Take 200 mg by mouth 3 (three) times daily as needed for cough.    . losartan (COZAAR) 100 MG tablet Take 1 tablet (100 mg total) by mouth daily. 90 tablet 1  . Multiple Vitamin (MULTIVITAMIN WITH MINERALS) TABS Take 1 tablet by mouth daily.    [name] omeprazole (PRILOSEC) 40 MG capsule TAKE 1 CAPSULE BY MOUTH DAILY 90 capsule 3  . polyethylene glycol powder (GLYCOLAX/MIRALAX) powder Take 17 g of powder with 4-8 ounces of fluid daily for constipation. 255 g 3  . spironolactone (ALDACTONE) 25 MG tablet TAKE 1 TABLET(25 MG) BY MOUTH DAILY 90 tablet 0  . buPROPion (WELLBUTRIN) 75 MG tablet Take 1 tablet (75 mg total) by mouth 2 (two) times daily. 60 tablet 3   No current facility-administered medications for this encounter.     BP 114/84   Pulse (!) 50   Wt 279 lb 8 oz (126.8 kg)   SpO2 100%   BMI 43.78 kg/m  General: NAD, obese Neck: Thick, JVP 8-9 cm, no thyromegaly or thyroid nodule.  Lungs: Clear to auscultation bilaterally with normal respiratory effort. CV: Nondisplaced PMI.  Heart regular S1/S2, no S3/S4, no murmur.  1+ ankle e edema.  No carotid bruit.  Normal pedal pulses.  Abdomen: Soft, nontender, no hepatosplenomegaly, no distention.  Skin: Intact without lesions or rashes.  Neurologic: Alert and oriented x 3.  Psych: Normal affect. Extremities: No clubbing or cyanosis.  HEENT: Normal.   Assessment/Plan:  CHRONIC SYSTOLIC HEART FAILURE  Nonischemic cardiomyopathy, possibly ETOH-related.  Echo in 5/15 with EF 30-35%, improved on 3/17 echo to EF 40-45%. Echo in 8/18 with EF back down to 25-30%.  Stable NYHA class II-III symptoms.  He is still drinking but has cut back a lot. He had angioedema with ACEI use. He looks mildly volume overloaded on exam.  - Continue  current doses of Coreg, losartan, and spironolactone.  - He cannot take Bidil due to headaches. - Cannot transition losartan to Entresto given angioedema risk (had angioedema with ACEI). - Increase Lasix to 60 qam/40 qpm.  BMET today and repeat in 10 days.  - I will arrange for a cardiac MRI to assess EF and to look for evidence of infiltrative disease.  If he is too large for MRI, will change to echo.  If EF remains low, will be ICD candidate.  Narrow QRS so no CRT.  OSA  I encouraged him to use his CPAP. TOBACCO ABUSE I counselled him again to quit smoking. He cannot take Chantix.  He will try Wellbutrin.  Followup in 6 wks.    [name] 02/18/2017

## 2017-02-21 ENCOUNTER — Ambulatory Visit (INDEPENDENT_AMBULATORY_CARE_PROVIDER_SITE_OTHER): Payer: Medicaid Other | Admitting: Internal Medicine

## 2017-02-21 ENCOUNTER — Other Ambulatory Visit: Payer: Self-pay

## 2017-02-21 VITALS — BP 113/80 | HR 85 | Temp 98.0°F | Ht 67.0 in | Wt 277.3 lb

## 2017-02-21 DIAGNOSIS — M25522 Pain in left elbow: Secondary | ICD-10-CM | POA: Diagnosis present

## 2017-02-21 DIAGNOSIS — I110 Hypertensive heart disease with heart failure: Secondary | ICD-10-CM | POA: Diagnosis not present

## 2017-02-21 DIAGNOSIS — I5042 Chronic combined systolic (congestive) and diastolic (congestive) heart failure: Secondary | ICD-10-CM

## 2017-02-21 DIAGNOSIS — Z79899 Other long term (current) drug therapy: Secondary | ICD-10-CM | POA: Diagnosis not present

## 2017-02-21 DIAGNOSIS — I10 Essential (primary) hypertension: Secondary | ICD-10-CM

## 2017-02-21 MED ORDER — SPIRONOLACTONE 25 MG PO TABS: 25.0000 mg | ORAL_TABLET | Freq: Every day | ORAL | 3 refills | Status: AC

## 2017-02-21 MED FILL — SPIRONOLACTONE 25 MG TABLET: 25 | 90 days supply | Qty: 90 | Fill #0

## 2017-02-21 NOTE — Progress Notes (Signed)
   CC: elbow pain   HPI:  [name] is a 62 y.o. with PMH as listed below who presents for acute concern of elbow pain. Please see the assessment and plans for the status of the patient chronic medical problems.    Past Medical History:  Diagnosis Date  . Active smoker /   1/2 ppd  . Chronic combined systolic and diastolic CHF (congestive heart failure) (HCC)   . Cocaine abuse (HCC)   . ETOH abuse   . GERD (gastroesophageal reflux disease)   . Gout   . HCV (hepatitis C virus)   . Hx of anaphylaxis with angioedema 08/23/2012   Admitted with angioedema on 08/24/2012 No intubation Unknown allergen Shrimp Vs Spices Vs ACEis Stopped Lisinopril   . Hypertension   . Morbid obesity (HCC)   . Nonischemic cardiomyopathy (HCC)    admitted 5/11 w acute systolic CHF. LHC showed no angiographic CAD. Echo (5/11) showed EF 15-20% w diffuse sever hypokinesis, moderate MR> RHC showed PCWP 10, CI 1.47 (Fick.) cause of CMP thought to be heavy ETOH and cocaine. No ICD due to ongoing ETOH intake  . Sleep apnea    Review of Systems: Refer to history of present illness and assessment and plans for pertinent review of systems, all others reviewed and negative  Physical Exam:  Vitals:   02/21/17 0918  BP: 113/80  Pulse: 85  Temp: 98 F (36.7 C)  TempSrc: Oral  SpO2: 99%  Weight: 277 lb 4.8 oz (125.8 kg)  Height: 5\' 7"  (1.702 m)   General: well appearing, no acute distress  Neuro: left biceps reflex intact, no decrease in sensation to fine touch over the left upper extremity, strength 5/5 in hand grip ( including small and ring finger ), elbow flexors and extensors, phalen's testing produces some discomfort at the elbow  MSK: no tenderness to palpation over the medial or lateral epicondyle  Skin: skin over the hands and elbows is dry, no obvious rashes over the exposed skin   Assessment & Plan:   Elbow pain  Three days of tingling and aching on the medial side of his elbow radiating down  to his small and ring fingers. Began suddenly when he reached to close a car door. Strength is intact on exam. Description is consistent with ulnar nerve injury. No obvious signs of ulnar fracture on exam.  - referral to sports medicine for ultrasound exam to confirm inflammation is the cause of this entrapment  - trial of rest, offloading, ice, extra strength tylenol, and topical analgesics  Hypertension  Blood pressure well controlled today. BP is managed with losartan, spironolactone, and carvedilol.  Request refill of spironolactone, he is on this for NYHA class III heart failure. Hospitalized with hyperkalemia 03/2015, he was taking K -dur in addition to the spironolactone and losartan due to misunderstanding.  - refilled spironolactone   See Encounters Tab for problem based charting.  Patient discussed with Dr. Heide Spark

## 2017-02-21 NOTE — Assessment & Plan Note (Signed)
Blood pressure well controlled today. BP is managed with losartan, spironolactone, and carvedilol.  Request refill of spironolactone, he is on this for NYHA class III heart failure. Hospitalized with hyperkalemia 03/2015, he was taking K -dur in addition to the spironolactone and losartan due to misunderstanding.  - refilled spironolactone

## 2017-02-21 NOTE — Assessment & Plan Note (Addendum)
Three days of tingling and aching on the medial side of his elbow radiating down to his small and ring fingers. Began suddenly when he reached to close a car door. Strength is intact on exam. Description is consistent with ulnar nerve injury. No obvious signs of ulnar fracture on exam.  - referral to sports medicine for ultrasound exam to confirm inflammation is the cause of this entrapment  - trial of rest, offloading, ice, extra strength tylenol, and topical analgesics

## 2017-02-21 NOTE — Patient Instructions (Addendum)
It was a pleasure to see you today Ethan Wilcox,   It sound like you may have injured one of the nerves in your elbow, I am sending you to sports medicine for them to do an ultrasound and see if there is inflammation. If your pain has resolved before this appointment you can cancel the appointment.   Please try using your tylenol 500 mg two tablets, three times daily  Try using a topical analgesic like icey hot or tiger balm on your elbow  Avoid leaning on your elbow, rest the elbow and apply ice  Please keep your follow up scheduled with me 2/25 for a regular check up

## 2017-02-22 ENCOUNTER — Ambulatory Visit: Payer: Medicaid Other | Admitting: Sports Medicine

## 2017-02-22 NOTE — Addendum Note (Signed)
Addended by: Neomia Dear on: 02/22/2017 07:02 PM   Modules accepted: Orders

## 2017-02-23 NOTE — Progress Notes (Signed)
Internal Medicine Clinic Attending  Case discussed with Dr. Blum at the time of the visit.  We reviewed the resident's history and exam and pertinent patient test results.  I agree with the assessment, diagnosis, and plan of care documented in the resident's note. 

## 2017-02-26 ENCOUNTER — Other Ambulatory Visit (HOSPITAL_COMMUNITY): Payer: Medicaid Other

## 2017-02-28 ENCOUNTER — Telehealth: Payer: Self-pay | Admitting: Cardiology

## 2017-02-28 NOTE — Telephone Encounter (Signed)
Called and LVM for the patient to call and let me know what time of day he wants his cardiac MRI.

## 2017-03-01 ENCOUNTER — Encounter (HOSPITAL_COMMUNITY): Payer: Self-pay

## 2017-03-05 ENCOUNTER — Encounter: Payer: Medicaid Other | Admitting: Internal Medicine

## 2017-03-05 ENCOUNTER — Telehealth (HOSPITAL_COMMUNITY): Payer: Self-pay | Admitting: *Deleted

## 2017-03-05 NOTE — Telephone Encounter (Signed)
Patient has not returned [name] calls to schedule CM[facility]. (see note below). I attempted to reach patient no answer/left voice message.   No pre cert reqd.         Ethan Wilcox   I have called this patient 3 times and he has not returned my call to schedule his MRI.   [facility]

## 2017-03-06 ENCOUNTER — Other Ambulatory Visit (HOSPITAL_COMMUNITY): Payer: Medicaid Other

## 2017-03-26 ENCOUNTER — Other Ambulatory Visit: Payer: Self-pay

## 2017-03-26 ENCOUNTER — Encounter (HOSPITAL_COMMUNITY): Payer: Self-pay

## 2017-03-26 ENCOUNTER — Other Ambulatory Visit (HOSPITAL_COMMUNITY): Payer: Self-pay | Admitting: *Deleted

## 2017-03-26 ENCOUNTER — Emergency Department (HOSPITAL_COMMUNITY)
Admission: EM | Admit: 2017-03-26 | Discharge: 2017-03-26 | Disposition: A | Discharge status: Home / Self Care | Payer: Medicaid Other | Attending: Emergency Medicine | Admitting: Emergency Medicine

## 2017-03-26 DIAGNOSIS — F17210 Nicotine dependence, cigarettes, uncomplicated: Secondary | ICD-10-CM | POA: Insufficient documentation

## 2017-03-26 DIAGNOSIS — Z79899 Other long term (current) drug therapy: Secondary | ICD-10-CM | POA: Insufficient documentation

## 2017-03-26 DIAGNOSIS — R109 Unspecified abdominal pain: Secondary | ICD-10-CM

## 2017-03-26 DIAGNOSIS — R1012 Left upper quadrant pain: Secondary | ICD-10-CM | POA: Diagnosis not present

## 2017-03-26 DIAGNOSIS — Z7982 Long term (current) use of aspirin: Secondary | ICD-10-CM | POA: Diagnosis not present

## 2017-03-26 DIAGNOSIS — I110 Hypertensive heart disease with heart failure: Secondary | ICD-10-CM | POA: Diagnosis not present

## 2017-03-26 DIAGNOSIS — I5042 Chronic combined systolic (congestive) and diastolic (congestive) heart failure: Secondary | ICD-10-CM | POA: Insufficient documentation

## 2017-03-26 LAB — URINALYSIS, ROUTINE W REFLEX MICROSCOPIC
Bilirubin Urine: NEGATIVE
Glucose, UA: NEGATIVE mg/dL
Ketones, ur: 20 mg/dL — AB
Leukocytes, UA: NEGATIVE
Nitrite: NEGATIVE
Protein, ur: 100 mg/dL — AB
Specific Gravity, Urine: 1.029 (ref 1.005–1.030)
pH: 5 (ref 5.0–8.0)

## 2017-03-26 LAB — COMPREHENSIVE METABOLIC PANEL
ALT: 47 U/L (ref 17–63)
AST: 61 U/L — ABNORMAL HIGH (ref 15–41)
Albumin: 3.9 g/dL (ref 3.5–5.0)
Alkaline Phosphatase: 85 U/L (ref 38–126)
Anion gap: 16 — ABNORMAL HIGH (ref 5–15)
BUN: 14 mg/dL (ref 6–20)
CO2: 23 mmol/L (ref 22–32)
Calcium: 9.3 mg/dL (ref 8.9–10.3)
Chloride: 98 mmol/L — ABNORMAL LOW (ref 101–111)
Creatinine, Ser: 1.28 mg/dL — ABNORMAL HIGH (ref 0.61–1.24)
GFR calc Af Amer: >60 mL/min (ref >60)
GFR calc non Af Amer: 59 mL/min — ABNORMAL LOW (ref >60)
Glucose, Bld: 151 mg/dL — ABNORMAL HIGH (ref 65–99)
Potassium: 5.1 mmol/L (ref 3.5–5.1)
Sodium: 137 mmol/L (ref 135–145)
Total Bilirubin: 2.6 mg/dL — ABNORMAL HIGH (ref 0.3–1.2)
Total Protein: 6.9 g/dL (ref 6.5–8.1)

## 2017-03-26 LAB — CBC
HCT: 47.3 % (ref 39.0–52.0)
Hemoglobin: 16.4 g/dL (ref 13.0–17.0)
MCH: 32.1 pg (ref 26.0–34.0)
MCHC: 34.7 g/dL (ref 30.0–36.0)
MCV: 92.6 fL (ref 78.0–100.0)
Platelets: 302 10*3/uL (ref 150–400)
RBC: 5.11 MIL/uL (ref 4.22–5.81)
RDW: 13.8 % (ref 11.5–15.5)
WBC: 8.3 10*3/uL (ref 4.0–10.5)

## 2017-03-26 MED FILL — FUROSEMIDE 40 MG TAB: 40 | 30 days supply | Qty: 75 | Fill #1

## 2017-03-26 NOTE — ED Notes (Signed)
Pt verbalized understanding discharge instructions and denies any further needs or questions at this time. VS stable, ambulatory and steady gait.   

## 2017-03-26 NOTE — ED Provider Notes (Signed)
[facility] EMERGENCY DEPARTMENT Provider Note   CSN: [id number] Arrival date & time: 03/26/17  1409     History   Chief Complaint Chief Complaint  Patient presents with  . Abdominal Pain    HPI Ethan Wilcox is a 62 y.o. male.  The history is provided by the patient and medical records. No language interpreter was used.   Ethan Wilcox is a 62 y.o. male  with a PMH of Hep C, CHF, HTN, cardiomyopathy, substance abuse, GERD who presents to the Emergency Department complaining of left-sided upper abdominal pain which began this morning.  Patient reports pain feeling as "ate too much or drink too much diet coke".  He tried Tums with little improvement.  He denies any alleviating or aggravating factors, but does state that while waiting in the waiting room, his symptoms resolved.  He is currently experiencing no abdominal pain.  He has never had any nausea, vomiting, diarrhea or constipation.  Denies any chest pain, shortness of breath, cough, congestion, fever or chills.  He does report drinking at least 1/2 pint of liquor every other day.  Denies any alcohol use today.  Past Medical History:  Diagnosis Date  . Active smoker /   1/2 ppd  . Chronic combined systolic and diastolic CHF (congestive heart failure) (HCC)   . Cocaine abuse (HCC)   . ETOH abuse   . GERD (gastroesophageal reflux disease)   . Gout   . HCV (hepatitis C virus)   . Hx of anaphylaxis with angioedema 08/23/2012   Admitted with angioedema on 08/24/2012 No intubation Unknown allergen Shrimp Vs Spices Vs ACEis Stopped Lisinopril   . Hypertension   . Morbid obesity (HCC)   . Nonischemic cardiomyopathy (HCC)    admitted 5/11 w acute systolic CHF. LHC showed no angiographic CAD. Echo (5/11) showed EF 15-20% w diffuse sever hypokinesis, moderate MR> RHC showed PCWP 10, CI 1.47 (Fick.) cause of CMP thought to be heavy ETOH and cocaine. No ICD due to ongoing ETOH intake  . Sleep apnea     Patient  Active Problem List   Diagnosis Date Noted  . Elbow pain, left 02/21/2017  . Hyperglycemia 10/10/2016  . Elevated serum creatinine 05/01/2016  . Fatigue 05/01/2016  . 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 Chronic hepatitis C without hepatic coma (HCC) 06/09/2015  . Health care maintenance 05/24/2015  . Hepatitis C antibody test positive 03/31/2015  . Microscopic hematuria 03/31/2015  . Cardiomyopathy (HCC)   . Acute bronchitis 03/09/2015  . Nonischemic cardiomyopathy (HCC) 02/10/2015  . Headache 02/10/2015  . Osteoarthritis of left shoulder 12/09/2014  . Dyspnea 07/21/2014  . Hyperlipidemia 07/21/2014  . External hemorrhoid 10/22/2013  . Obesity, morbid (HCC) 07/30/2013  . Depression 02/26/2013  . Gastroesophageal reflux disease 02/26/2013  . Arthritis 02/26/2013  . Illiteracy 08/28/2012  . Hx of Anaphylaxis 08/23/2012  . Severe OSA 11/27/2010  . Systolic CHF 08/03/2009  . Alcohol abuse 06/04/2009  . TOBACCO ABUSE 06/04/2009  . HYPERTENSION 06/04/2009    Past Surgical History:  Procedure Laterality Date  . NO PAST SURGERIES         Home Medications    Prior to Admission medications   Medication Sig Start Date End Date Taking? Authorizing Provider  albuterol (PROVENTIL HFA;VENTOLIN HFA) 108 (90 Base) MCG/ACT inhaler Inhale 2 puffs into the lungs every 4 (four) hours as needed for wheezing or shortness of breath. 03/12/15   Lora Paula, MD  aspirin (ASPIR-LOW) 81 MG EC tablet  Take 81 mg by mouth daily.     [provider]  buPROPion (WELLBUTRIN) 75 MG tablet Take 1 tablet (75 mg total) by mouth 2 (two) times daily. 02/16/17   Laurey Morale, MD  carvedilol (COREG) 25 MG tablet TAKE 1 TABLET BY MOUTH TWICE A DAY 01/11/17   [name], MD  Dextromethorphan-Benzocaine (COUGH/SORE THROAT LOZENGES MT) Use as directed 1-3 drops in the mouth or throat daily as needed (cough).    [provider]  diphenhydrAMINE (BENADRYL) 25 MG tablet Take 25 mg by mouth every  6 (six) hours as needed for allergies. Reported on 05/24/2015    [provider]  fenofibrate (TRICOR) 145 MG tablet Take 1 tablet (145 mg total) by mouth daily. 06/02/15   Bensimhon, Bevelyn Buckles, MD  furosemide (LASIX) 40 MG tablet Take 60 mg (1.5 tabs) in AM, then 40 mg (1 tab) in  PM 02/16/17   Laurey Morale, MD  guaifenesin (ROBITUSSIN) 100 MG/5ML syrup Take 200 mg by mouth 3 (three) times daily as needed for cough.    [provider]  losartan (COZAAR) 100 MG tablet Take 1 tablet (100 mg total) by mouth daily. 10/10/16   [name], MD  Multiple Vitamin (MULTIVITAMIN WITH MINERALS) TABS Take 1 tablet by mouth daily.    [provider]  omeprazole (PRILOSEC) 40 MG capsule TAKE 1 CAPSULE BY MOUTH DAILY 08/02/16   [name], MD  polyethylene glycol powder (GLYCOLAX/MIRALAX) powder Take 17 g of powder with 4-8 ounces of fluid daily for constipation. 10/10/16   [name], MD  spironolactone (ALDACTONE) 25 MG tablet Take 1 tablet (25 mg total) by mouth daily. 02/21/17   Eulah Pont, MD    Family History Family History  Problem Relation Age of Onset  . Heart disease Mother   . Cancer Mother   . Heart disease Father     Social History Social History   Tobacco Use  . Smoking status: Current Every Day Smoker    Packs/day: 0.50    Years: 38.00    Pack years: 19.00    Types: Cigarettes    Start date: 01/10/1972  . Smokeless tobacco: Current User  . Tobacco comment: 1/2 pack/day  Substance Use Topics  . Alcohol use: Yes    Alcohol/week: 7.2 oz    Types: 12 Shots of liquor per week    Comment: Vodka 1/2 pint - on/off.  . Drug use: No    Comment: none in over 20 yrs per patient     Allergies   Lisinopril; Shrimp [shellfish allergy]; Bidil [isosorb dinitrate-hydralazine]; and Penicillins   Review of Systems Review of Systems  Gastrointestinal: Positive for abdominal pain. Negative for constipation, diarrhea, nausea and vomiting.  All other systems  reviewed and are negative.    Physical Exam Updated Vital Signs BP (!) 132/92   Pulse 82   Temp 98 F (36.7 C) (Oral)   Resp 17   Ht 5\' 7"  (1.702 m)   Wt 127 kg (280 lb)   SpO2 96%   BMI 43.85 kg/m   Physical Exam  Constitutional: He is oriented to person, place, and time. He appears well-developed and well-nourished. No distress.  Nontoxic-appearing.  HENT:  Head: Normocephalic and atraumatic.  Neck: Neck supple.  Cardiovascular: Normal rate, regular rhythm and normal heart sounds.  No murmur heard. Pulmonary/Chest: Effort normal and breath sounds normal. No respiratory distress.  Abdominal: Soft. Bowel sounds are normal. He exhibits no distension.  No abdominal tenderness.  Neurological: He is alert and oriented to person, place, and time.  Skin: Skin is warm and dry.  Nursing note and vitals reviewed.    ED Treatments / Results  Labs (all labs ordered are listed, but only abnormal results are displayed) Labs Reviewed  URINALYSIS, ROUTINE W REFLEX MICROSCOPIC - Abnormal; Notable for the following components:      Result Value   Color, Urine AMBER (*)    Hgb urine dipstick MODERATE (*)    Ketones, ur 20 (*)    Protein, ur 100 (*)    Bacteria, UA RARE (*)    Squamous Epithelial / LPF 0-5 (*)    All other components within normal limits  COMPREHENSIVE METABOLIC PANEL - Abnormal; Notable for the following components:   Chloride 98 (*)    Glucose, Bld 151 (*)    Creatinine, Ser 1.28 (*)    AST 61 (*)    Total Bilirubin 2.6 (*)    GFR calc non Af Amer 59 (*)    Anion gap 16 (*)    All other components within normal limits  CBC    EKG  EKG Interpretation None       Radiology No results found.  Procedures Procedures (including critical care time)  Medications Ordered in ED Medications - No data to display   Initial Impression / Assessment and Plan / ED Course  I have reviewed the triage vital signs and the nursing notes.  Pertinent labs &  imaging results that were available during my care of the patient were reviewed by me and considered in my medical decision making (see chart for details).    Ethan Wilcox is a 62 y.o. male who presents to ED for abdominal pain which began this morning. Upon my evaluation, pain has resolved. He has no abdominal tenderness on exam. Afebrile, hemodynamically stable. CBC wdl. CMP pending. I was informed by nursing that patient left prior to CMP resulting.     Final Clinical Impressions(s) / ED Diagnoses   Final diagnoses:  Left sided abdominal pain    ED Discharge Orders    None       Ward, Chase Picket, PA-C 03/26/17 2223    Raeford Razor, MD 03/27/17 1505

## 2017-03-26 NOTE — ED Notes (Signed)
Pt approached the front desk and notified this Rn that he was leaving. Pt asked to speak with his Rn, but pt left. Ambulatory appeared to be in NAD.

## 2017-03-26 NOTE — ED Notes (Addendum)
Repeat dark green and light green top. Lab stated blood has hemolyzed. Triage RN notified.

## 2017-03-26 NOTE — ED Notes (Signed)
All previous charting under RN Caryn Bee was charted by Lajuana Ripple.

## 2017-03-26 NOTE — ED Triage Notes (Signed)
Pt states LLQ pain since this am that sometimes radiates to RLQ.  Denies change in bowel or bladder habits.

## 2017-03-26 NOTE — ED Provider Notes (Addendum)
Patient placed in Quick Look pathway, seen and evaluated   Chief Complaint: abdominal pain  HPI:   Constant, non radiating RUQ abdominal pain below left rib cage described as "feel like I'm too full" onset this morning not improved with Tums. Non exertional non pleuritic. Drinks at least 1/2 pint of liquor every other day. H/o CHF, cocaine and tobacco abuse.   ROS: no fevers, chills, nausea, vomiting, changes in BM.  H/o exertional dyspnea unchanged. Specifically denies radiating into chest, CP, palpitations or new SOB.   Physical Exam:   Gen: No distress  Neuro: Awake and Alert  Skin: Warm    Focused Exam: RRR. Decreased breath sounds in lower lobes bilaterally although difficult exam due to body habitus. Focal LUQ abdominal tenderness, mild without G/R/R.    Initiation of care has begun. The patient has been counseled on the process, plan, and necessity for staying for the completion/evaluation, and the remainder of the medical screening examination      Liberty Handy, PA-C 03/26/17 1457    Melene Plan, DO 03/26/17 1523

## 2017-03-26 NOTE — ED Notes (Signed)
EKG done in triage. Hardcopy available, but did not cross over in [place].

## 2017-03-27 ENCOUNTER — Ambulatory Visit (INDEPENDENT_AMBULATORY_CARE_PROVIDER_SITE_OTHER): Payer: Medicaid Other | Admitting: Internal Medicine

## 2017-03-27 ENCOUNTER — Encounter (INDEPENDENT_AMBULATORY_CARE_PROVIDER_SITE_OTHER): Payer: Self-pay

## 2017-03-27 ENCOUNTER — Other Ambulatory Visit: Payer: Self-pay

## 2017-03-27 VITALS — BP 157/118 | HR 95 | Temp 97.8°F | Ht 67.0 in | Wt 275.2 lb

## 2017-03-27 DIAGNOSIS — R1084 Generalized abdominal pain: Secondary | ICD-10-CM | POA: Diagnosis present

## 2017-03-27 DIAGNOSIS — K219 Gastro-esophageal reflux disease without esophagitis: Secondary | ICD-10-CM | POA: Diagnosis not present

## 2017-03-27 NOTE — Progress Notes (Signed)
   CC: Abdominal pain  HPI:  Mr.Jerrid Lacretia Nicks Kehn is a 62 y.o. male with GERD and alcohol use disorder who presented to the clinic for evaluation of acute onset migratory abdominal pain. The pain started yesterday morning after eating pork chops for breakfast. Initially he felt that it was gas pain and attempted to take some tums which did not relieve the pain. He subsequently presented to the emergency department for further evaluation. By the time he was evaluated by a physician his abdominal pain had resided and basic labs including CMP (hemolyzed) and CBC were within normal limits. He then went home and shortly after arrival at home the pain came back. It has since persisted and so he is sought further medical attention today.  He states that the pain is sharp and moves around his belly. He continues to have normal bowel movements, passing gas, and is not nauseous. The pain does not radiate anywhere. He is tried multiple over-the-counter medications without relief. He does not noticed increased pain with movement. He does state that water and belching helps with the pain. He is not noticed any increase in the pain with eating. He is never had abdominal surgery. He has never had pancreatitis or an issue with his gallbladder. Denies fevers, chills, N/V, changes in bowel habits, rash.  Past Medical History:  Diagnosis Date  . Active smoker /   1/2 ppd  . Chronic combined systolic and diastolic CHF (congestive heart failure) (HCC)   . Cocaine abuse (HCC)   . ETOH abuse   . GERD (gastroesophageal reflux disease)   . Gout   . HCV (hepatitis C virus)   . Hx of anaphylaxis with angioedema 08/23/2012   Admitted with angioedema on 08/24/2012 No intubation Unknown allergen Shrimp Vs Spices Vs ACEis Stopped Lisinopril   . Hypertension   . Morbid obesity (HCC)   . Nonischemic cardiomyopathy (HCC)    admitted 5/11 w acute systolic CHF. LHC showed no angiographic CAD. Echo (5/11) showed EF 15-20% w diffuse  sever hypokinesis, moderate MR> RHC showed PCWP 10, CI 1.47 (Fick.) cause of CMP thought to be heavy ETOH and cocaine. No ICD due to ongoing ETOH intake  . Sleep apnea    Review of Systems:   Denies fevers, chills, N/V, changes in bowel habits, rash.  Physical Exam: Vitals:   03/27/17 1500  BP: (!) 155/105  Pulse: 88  Temp: 97.8 F (36.6 C)  TempSrc: Oral  SpO2: 99%  Weight: 275 lb 3.2 oz (124.8 kg)  Height: 5\' 7"  (1.702 m)   General: Obese male in no acute distress Pulm: Good air movement with no wheezing or crackles  CV: RRR, no murmurs, no rubs  Abdomen: Active bowel sounds, soft, non-distended, no tenderness to palpation, no guarding or peritoneal signs Skin: Warm and dry, no rash  Assessment & Plan:   See Encounters Tab for problem based charting.  Patient discussed with Dr. Rogelia Boga

## 2017-03-27 NOTE — Assessment & Plan Note (Addendum)
Patient is a 62 year old male with GERD and alcohol use disorder who presented the clinic with acute migratory abdominal pain. His physical exam is fairly unremarkable with normal vital signs and up at nine abdominal exam. CMP, CBC, and EKG from yesterday were reviewed. This presentation makes biliary colic, pancreatitis, PUD/gastritis, viral gastroenteritis, referred cardiac pain, or bowel obstruction less likely.  Based on the patient's symptoms and migratory patterns I think this is most likely gas pain. He was encouraged to try some over-the-counter gas relief medications and to return if his abdominal pain gets worse, he is unable to tolerate PO intake, he is changes in bowel habits, or starts to have fevers. In the meantime we will get CMP and a lactic acid to ensure we are not missing anything.  Plan: - STAT CMP and Lactic Acid - OTC gas relief

## 2017-03-27 NOTE — Patient Instructions (Signed)
Thank you for allowing [place] to provide your care. Please try some of the over the counter gas relief agents. If your abdominal pain gets worse, you are unable to tolerate eating/drinking, or start to have a fever please seek further medical care as soon as possible.   We are going to check some labs today. I will call you with the results once available.

## 2017-03-28 ENCOUNTER — Telehealth: Payer: Self-pay | Admitting: Internal Medicine

## 2017-03-28 NOTE — Addendum Note (Signed)
Addended by: Levora Dredge T on: 03/28/2017 03:50 PM   Modules accepted: Orders

## 2017-03-28 NOTE — Progress Notes (Signed)
Internal Medicine Clinic Attending  Case discussed with Dr. Helberg at the time of the visit.  We reviewed the resident's history and exam and pertinent patient test results.  I agree with the assessment, diagnosis, and plan of care documented in the resident's note.    

## 2017-03-28 NOTE — Telephone Encounter (Signed)
Called patient today. Abdominal pain resolved with GasX. No need to come back for missed labs.

## 2017-03-30 ENCOUNTER — Encounter (HOSPITAL_COMMUNITY): Payer: Self-pay

## 2017-03-30 ENCOUNTER — Ambulatory Visit (HOSPITAL_COMMUNITY)
Admission: RE | Admit: 2017-03-30 | Discharge: 2017-03-30 | Disposition: A | Discharge status: Home / Self Care | Payer: Medicaid Other | Source: Ambulatory Visit | Attending: Cardiology | Admitting: Cardiology

## 2017-03-30 VITALS — BP 110/72 | HR 79 | Ht 67.0 in | Wt 271.0 lb

## 2017-03-30 DIAGNOSIS — I10 Essential (primary) hypertension: Secondary | ICD-10-CM | POA: Diagnosis not present

## 2017-03-30 DIAGNOSIS — Z550 Illiteracy and low-level literacy: Secondary | ICD-10-CM

## 2017-03-30 DIAGNOSIS — F1010 Alcohol abuse, uncomplicated: Secondary | ICD-10-CM | POA: Insufficient documentation

## 2017-03-30 DIAGNOSIS — F17210 Nicotine dependence, cigarettes, uncomplicated: Secondary | ICD-10-CM | POA: Insufficient documentation

## 2017-03-30 DIAGNOSIS — E669 Obesity, unspecified: Secondary | ICD-10-CM | POA: Diagnosis not present

## 2017-03-30 DIAGNOSIS — Z79899 Other long term (current) drug therapy: Secondary | ICD-10-CM | POA: Diagnosis not present

## 2017-03-30 DIAGNOSIS — I429 Cardiomyopathy, unspecified: Secondary | ICD-10-CM | POA: Insufficient documentation

## 2017-03-30 DIAGNOSIS — Z7982 Long term (current) use of aspirin: Secondary | ICD-10-CM | POA: Insufficient documentation

## 2017-03-30 DIAGNOSIS — F17200 Nicotine dependence, unspecified, uncomplicated: Secondary | ICD-10-CM

## 2017-03-30 DIAGNOSIS — I428 Other cardiomyopathies: Secondary | ICD-10-CM | POA: Diagnosis not present

## 2017-03-30 DIAGNOSIS — I5022 Chronic systolic (congestive) heart failure: Secondary | ICD-10-CM | POA: Insufficient documentation

## 2017-03-30 DIAGNOSIS — G4733 Obstructive sleep apnea (adult) (pediatric): Secondary | ICD-10-CM | POA: Diagnosis not present

## 2017-03-30 DIAGNOSIS — F1421 Cocaine dependence, in remission: Secondary | ICD-10-CM | POA: Diagnosis not present

## 2017-03-30 LAB — BASIC METABOLIC PANEL
ANION GAP: 12 (ref 5–15)
BUN: 13 mg/dL (ref 6–20)
CHLORIDE: 98 mmol/L — AB (ref 101–111)
CO2: 24 mmol/L (ref 22–32)
Calcium: 9.3 mg/dL (ref 8.9–10.3)
Creatinine, Ser: 1.29 mg/dL — ABNORMAL HIGH (ref 0.61–1.24)
GFR calc Af Amer: >60 mL/min (ref >60)
GFR calc non Af Amer: 59 mL/min — ABNORMAL LOW (ref >60)
GLUCOSE: 130 mg/dL — AB (ref 65–99)
POTASSIUM: 4.6 mmol/L (ref 3.5–5.1)
Sodium: 134 mmol/L — ABNORMAL LOW (ref 135–145)

## 2017-03-30 LAB — MAGNESIUM: Magnesium: 2.2 mg/dL (ref 1.7–2.4)

## 2017-03-30 NOTE — Patient Instructions (Signed)
Labs today We will only contact you if something comes back abnormal or we need to make some changes. Otherwise no news is good news!  We will reschedule your cardiac MRI and be in contact with your regarding appointment date and time.    Your physician recommends that you schedule a follow-up appointment in: 6 weeks with Joanell Rising   Do the following things EVERYDAY: 1) Weigh yourself in the morning before breakfast. Write it down and keep it in a log. 2) Take your medicines as prescribed 3) Eat low salt foods-Limit salt (sodium) to 2000 mg per day.  4) Stay as active as you can everyday 5) Limit all fluids for the day to less than 2 liters

## 2017-03-30 NOTE — Progress Notes (Signed)
Patient ID: Ethan Wilcox, male   DOB: Dec 11, 1955, 62 y.o.   MRN: [id number]   PCP: Ethan Pont, MD Cardiology: Ethan Wilcox Ethan Wilcox is a 62 y.o. male with history of nonischemic cardiomyopathy presents for cardiology followup. Ethan Wilcox had a prolonged admission in 5/11 for CHF. Echo showed EF 15-20% with diffuse hypokinesis. Left and right heart cath showed no angiographic CAD and Fick CI 1.47. Patient became hypotensive with ARF (cardiogenic shock) requiring milrinone and dopamine. He was gradually titrated off these medications and begun on an oral regimen.  He saw Ethan Wilcox regarding an ICD but actually at that time admitted to resuming ETOH intake. Therefore, no ICD placed.  Echo in 5/15 showed EF 30-35%, diffuse hypokinesis.  He has had a hospitalization with angioedema.  This actually may have been related to eating shellfish but lisinopril was stopped.  He was admitted for a CHF exacerbation in 8/18.   He presents today for regular follow up. He just recently made the increase to lasix ordered at last visit due to financial concerns.  He continues to smoke 1/2 ppd and to 8 oz of liquor a day. He recently switched to diet sodas but is drinking 3-4 cans a day. Drinking 3-4 bottles of water  He cannot read his medications bottles, but has them color coded. He gets mild SOB after walking ~ 200 feet or up stairs. No SOB with ADLs. Doesn't use his CPAP. Denies CP.   Allergies (verified):  1) ! Penicillin   Past Medical History:  1. Nonischemic Cardiomyopathy: Admitted 5/11 with acute systolic CHF. LHC showed no angiographic CAD. Echo (5/11) showed EF 15-20% with diffuse severe hypokinesis, moderate MR. RHC showed PCWP 10, CI 1.47 (Fick).  Echo (4/14) with EF 35%, diffuse hypokinesis.  Echo (5/15) with EF 30-35%, mild LV dilation, mild MR.  Cause of CMP thought to be heavy ETOH and cocaine. No ICD yet due to ongoing ETOH intake.  - Echo (3/17) with EF 40-45%, diffuse hypokinesis, mild MR.  -  Unable to tolerate Bidil due to headaches. - Echo (8/18): EF 25-30%, diffuse hypokinesis.  2. ETOH abuse 3. Cocaine abuse: Quit in 2011  4. Active smoker: 1/2 ppd  5. Obesity  6. OSA: Severe. Not using CPAP.  7. Angioedema: ? ACEI or shellfish related.   8. HCV  Family History:  "Heart disease" in mother   Social History:  Worked as Ethan Wilcox. Smokes 1/2 ppd. 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 Heavy ETOH in the past, now drinking around 1/2 pint liquor daily.    Review of systems complete and found to be negative unless listed in HPI.    Current Outpatient Medications  Medication Sig Dispense Refill  . albuterol (PROVENTIL HFA;VENTOLIN HFA) 108 (90 Base) MCG/ACT inhaler Inhale 2 puffs into the lungs every 4 (four) hours as needed for wheezing or shortness of breath. 1 Inhaler 1  . aspirin (ASPIR-LOW) 81 MG EC tablet Take 81 mg by mouth daily.     [name] buPROPion (WELLBUTRIN) 75 MG tablet Take 1 tablet (75 mg total) by mouth 2 (two) times daily. 60 tablet 3  . carvedilol (COREG) 25 MG tablet TAKE 1 TABLET BY MOUTH TWICE A DAY 180 tablet 1  . Dextromethorphan-Benzocaine (COUGH/SORE THROAT LOZENGES MT) Use as directed 1-3 drops in the mouth or throat daily as needed (cough).    . diphenhydrAMINE (BENADRYL) 25 MG tablet Take 25 mg by mouth every 6 (six) hours as needed for allergies. Reported on  05/24/2015    . fenofibrate (TRICOR) 145 MG tablet Take 1 tablet (145 mg total) by mouth daily. 30 tablet 6  . furosemide (LASIX) 40 MG tablet Take 60 mg (1.5 tabs) in AM, then 40 mg (1 tab) in  PM 75 tablet 3  . guaifenesin (ROBITUSSIN) 100 MG/5ML syrup Take 200 mg by mouth 3 (three) times daily as needed for cough.    . losartan (COZAAR) 100 MG tablet Take 1 tablet (100 mg total) by mouth daily. 90 tablet 1  . Multiple Vitamin (MULTIVITAMIN WITH MINERALS) TABS Take 1 tablet by mouth daily.    [name] omeprazole (PRILOSEC) 40 MG capsule TAKE 1 CAPSULE BY MOUTH DAILY 90 capsule 3  . polyethylene glycol powder  (GLYCOLAX/MIRALAX) powder Take 17 g of powder with 4-8 ounces of fluid daily for constipation. 255 g 3  . spironolactone (ALDACTONE) 25 MG tablet Take 1 tablet (25 mg total) by mouth daily. 90 tablet 3   No current facility-administered medications for this encounter.    Vitals:   03/30/17 0920  BP: 110/72  Pulse: 79  SpO2: 96%  Weight: 271 lb (122.9 kg)  Height: 5\' 7"  (1.702 m)   Wt Readings from Last 3 Encounters:  03/30/17 271 lb (122.9 kg)  03/27/17 275 lb 3.2 oz (124.8 kg)  03/26/17 280 lb (127 kg)     Physical Exam General: NAD, obese. HEENT: Normal Neck: Supple. JVP difficult due to body habitus. Carotids 2+ bilat; no bruits. No thyromegaly or nodule noted. Cor: PMI nondisplaced. RRR, occasional PVC. No M/G/R noted Lungs: Diminished throughout.  Abdomen: Soft, non-tender, non-distended, no HSM. No bruits or masses. +BS  Extremities: No cyanosis, clubbing, or rash. R and LLE no edema.  Neuro: Alert & orientedx3, cranial nerves grossly intact. moves all 4 extremities w/o difficulty. Affect pleasant   Assessment/Plan:  1. Chronic systolic HF Nonischemic cardiomyopathy, possibly ETOH-related.  Echo in 5/15 with EF 30-35%, improved on 3/17 echo to EF 40-45%. Echo in 8/18 with EF back down to 25-30%.  - Stable NYHA class II-III symptoms - Volume status relatively stable on exam on increased lasix.  - Continue lasix 60 mg q am and 40 mg q pm. Needs to cut back fluid.  - Continue coreg 25 mg BID - Continue losartan 100 mg daily - Continue spiro 25 mg daily.  - He cannot take Bidil due to headaches. - Cannot transition losartan to Entresto given angioedema risk (had angioedema with ACEI). - Will re-arrange for cardiac MRI to assess EF and to look for evidence of infiltrative disease. If he is too large, will repeat Echo. If EF remains low will be ICD candidate.  2. OSA  - Encouraged nightly CPAP.  3. Tobacco abuse - Smoking 1/2 ppd.  - Using Wellbutrin.  4. ETOH  Abuse - Drinking about 1/2 pint a day. Encouraged complete cessation.  BP well controlled on goal dose meds. Plan cMRI, Echo if does not fit. RTC 6 weeks.   Ethan Freer, PA-C  03/30/2017  Greater than 50% of the 30 minute visit was spent in counseling/coordination of care regarding disease state education, salt/fluid restriction, sliding scale diuretics, and medication compliance.

## 2017-04-20 MED FILL — LOSARTAN POTASSIUM 100 MG T: 100 | 90 days supply | Qty: 90 | Fill #1

## 2017-04-23 MED FILL — FUROSEMIDE 40 MG TAB: 40 | 30 days supply | Qty: 75 | Fill #2

## 2017-04-23 MED FILL — CARVEDILOL 25 MG TABLET: 25 | 90 days supply | Qty: 180 | Fill #1

## 2017-04-24 ENCOUNTER — Encounter: Payer: Self-pay | Admitting: Internal Medicine

## 2017-04-24 ENCOUNTER — Encounter: Payer: Medicaid Other | Admitting: Internal Medicine

## 2017-04-26 NOTE — Progress Notes (Unsigned)
d 

## 2017-04-30 MED FILL — OMEPRAZOLE DR 40 MG CAPSULE: 40 | 90 days supply | Qty: 90 | Fill #3

## 2017-05-11 ENCOUNTER — Encounter (HOSPITAL_COMMUNITY): Payer: Self-pay

## 2017-05-11 ENCOUNTER — Ambulatory Visit (HOSPITAL_COMMUNITY)
Admission: RE | Admit: 2017-05-11 | Discharge: 2017-05-11 | Disposition: A | Discharge status: Home / Self Care | Payer: Medicaid Other | Source: Ambulatory Visit | Attending: Cardiology | Admitting: Cardiology

## 2017-05-11 VITALS — BP 128/92 | HR 86 | Wt 269.6 lb

## 2017-05-11 DIAGNOSIS — G4733 Obstructive sleep apnea (adult) (pediatric): Secondary | ICD-10-CM | POA: Diagnosis not present

## 2017-05-11 DIAGNOSIS — Z79899 Other long term (current) drug therapy: Secondary | ICD-10-CM | POA: Diagnosis not present

## 2017-05-11 DIAGNOSIS — Z6841 Body Mass Index (BMI) 40.0 and over, adult: Secondary | ICD-10-CM | POA: Diagnosis not present

## 2017-05-11 DIAGNOSIS — E669 Obesity, unspecified: Secondary | ICD-10-CM | POA: Diagnosis not present

## 2017-05-11 DIAGNOSIS — F17210 Nicotine dependence, cigarettes, uncomplicated: Secondary | ICD-10-CM | POA: Diagnosis not present

## 2017-05-11 DIAGNOSIS — Z9119 Patient's noncompliance with other medical treatment and regimen: Secondary | ICD-10-CM | POA: Diagnosis not present

## 2017-05-11 DIAGNOSIS — F1010 Alcohol abuse, uncomplicated: Secondary | ICD-10-CM | POA: Insufficient documentation

## 2017-05-11 DIAGNOSIS — I5022 Chronic systolic (congestive) heart failure: Secondary | ICD-10-CM | POA: Diagnosis present

## 2017-05-11 DIAGNOSIS — F17200 Nicotine dependence, unspecified, uncomplicated: Secondary | ICD-10-CM | POA: Diagnosis not present

## 2017-05-11 DIAGNOSIS — I428 Other cardiomyopathies: Secondary | ICD-10-CM | POA: Insufficient documentation

## 2017-05-11 DIAGNOSIS — I10 Essential (primary) hypertension: Secondary | ICD-10-CM

## 2017-05-11 DIAGNOSIS — Z7982 Long term (current) use of aspirin: Secondary | ICD-10-CM | POA: Diagnosis not present

## 2017-05-11 LAB — BASIC METABOLIC PANEL WITH GFR
Anion gap: 14 (ref 5–15)
BUN: 13 mg/dL (ref 6–20)
CO2: 26 mmol/L (ref 22–32)
Calcium: 9.6 mg/dL (ref 8.9–10.3)
Chloride: 100 mmol/L — ABNORMAL LOW (ref 101–111)
Creatinine, Ser: 1.2 mg/dL (ref 0.61–1.24)
GFR calc Af Amer: >60 mL/min
GFR calc non Af Amer: >60 mL/min
Glucose, Bld: 126 mg/dL — ABNORMAL HIGH (ref 65–99)
Potassium: 4.6 mmol/L (ref 3.5–5.1)
Sodium: 140 mmol/L (ref 135–145)

## 2017-05-11 LAB — CBC
HCT: 47.4 % (ref 39.0–52.0)
Hemoglobin: 15.7 g/dL (ref 13.0–17.0)
MCH: 31.4 pg (ref 26.0–34.0)
MCHC: 33.1 g/dL (ref 30.0–36.0)
MCV: 94.8 fL (ref 78.0–100.0)
Platelets: 309 K/uL (ref 150–400)
RBC: 5 MIL/uL (ref 4.22–5.81)
RDW: 14.3 % (ref 11.5–15.5)
WBC: 7.2 K/uL (ref 4.0–10.5)

## 2017-05-11 NOTE — Patient Instructions (Signed)
Your physician has requested that you have a cardiac MRI. Cardiac MRI uses a computer to create images of your heart as its beating, producing both still and moving pictures of your heart and major blood vessels. For further information please visit [email or web address]. Please follow the instruction sheet given to you today for more information.  Labs today We will only contact you if something comes back abnormal or we need to make some changes. Otherwise no news is good news!   Your physician recommends that you schedule a follow-up appointment in:  3-4 months Dr Shirlee Latch  Do the following things EVERYDAY: 1) Weigh yourself in the morning before breakfast. Write it down and keep it in a log. 2) Take your medicines as prescribed 3) Eat low salt foods-Limit salt (sodium) to 2000 mg per day.  4) Stay as active as you can everyday 5) Limit all fluids for the day to less than 2 liters

## 2017-05-11 NOTE — Progress Notes (Signed)
Patient ID: Ethan Wilcox, male   DOB: Sep 22, 1955, 62 y.o.   MRN: [id number]     Advanced Heart Failure Clinic Note   PCP: Ethan Pont, MD Cardiology: Dr. Gwendel Wilcox Ethan Wilcox is a 62 y.o. male with history of nonischemic cardiomyopathy presents for cardiology followup. Ethan Wilcox had a prolonged admission in 5/11 for CHF. Echo showed EF 15-20% with diffuse hypokinesis. Left and right heart cath showed no angiographic CAD and Fick CI 1.47. Patient became hypotensive with ARF (cardiogenic shock) requiring milrinone and dopamine. He was gradually titrated off these medications and begun on an oral regimen.  He saw Dr. Ladona Wilcox regarding an ICD but actually at that time admitted to resuming ETOH intake. Therefore, no ICD placed.  Echo in 5/15 showed EF 30-35%, diffuse hypokinesis.  He has had a hospitalization with angioedema.  This actually may have been related to eating shellfish but lisinopril was stopped.  He was admitted for a CHF exacerbation in 8/18.   He presents today for regular follow up. Overall doing well. Biggest complaint is a hemorrhoid flare. Has not seen PCP for. Continue to smoke 1/4 - 1/2 ppd. Drinks liquor most days. Has tried to cut back on fluid intake, but gets hot and sweats very easily. Denies SOB on flat ground or with ADLs. Mild SOB with stairs.  Non-compliant with CPAP. Denies CP. Taking all medications as directed.   Allergies (verified):  1) ! Penicillin   Past Medical History:  1. Nonischemic Cardiomyopathy: Admitted 5/11 with acute systolic CHF. LHC showed no angiographic CAD. Echo (5/11) showed EF 15-20% with diffuse severe hypokinesis, moderate MR. RHC showed PCWP 10, CI 1.47 (Fick).  Echo (4/14) with EF 35%, diffuse hypokinesis.  Echo (5/15) with EF 30-35%, mild LV dilation, mild MR.  Cause of CMP thought to be heavy ETOH and cocaine. No ICD yet due to ongoing ETOH intake.  - Echo (3/17) with EF 40-45%, diffuse hypokinesis, mild MR.  - Unable to tolerate Bidil due  to headaches. - Echo (8/18): EF 25-30%, diffuse hypokinesis.  2. ETOH abuse 3. Cocaine abuse: Quit in 2011  4. Active smoker: 1/2 ppd  5. Obesity  6. OSA: Severe. Not using CPAP.  7. Angioedema: ? ACEI or shellfish related.   8. HCV  Family History:  "Heart disease" in mother   Social History:  Worked as Ethan Wilcox. Smokes 1/2 ppd. 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 Heavy ETOH in the past, now drinking around 1/2 pint liquor daily.    Review of systems complete and found to be negative unless listed in HPI.    Current Outpatient Medications  Medication Sig Dispense Refill  . albuterol (PROVENTIL HFA;VENTOLIN HFA) 108 (90 Base) MCG/ACT inhaler Inhale 2 puffs into the lungs every 4 (four) hours as needed for wheezing or shortness of breath. 1 Inhaler 1  . aspirin (ASPIR-LOW) 81 MG EC tablet Take 81 mg by mouth daily.     [name] buPROPion (WELLBUTRIN) 75 MG tablet Take 1 tablet (75 mg total) by mouth 2 (two) times daily. 60 tablet 3  . carvedilol (COREG) 25 MG tablet TAKE 1 TABLET BY MOUTH TWICE A DAY 180 tablet 1  . Dextromethorphan-Benzocaine (COUGH/SORE THROAT LOZENGES MT) Use as directed 1-3 drops in the mouth or throat daily as needed (cough).    . diphenhydrAMINE (BENADRYL) 25 MG tablet Take 25 mg by mouth every 6 (six) hours as needed for allergies. Reported on 05/24/2015    . fenofibrate (TRICOR) 145 MG tablet Take  1 tablet (145 mg total) by mouth daily. 30 tablet 6  . furosemide (LASIX) 40 MG tablet Take 60 mg (1.5 tabs) in AM, then 40 mg (1 tab) in  PM 75 tablet 3  . guaifenesin (ROBITUSSIN) 100 MG/5ML syrup Take 200 mg by mouth 3 (three) times daily as needed for cough.    . losartan (COZAAR) 100 MG tablet Take 1 tablet (100 mg total) by mouth daily. 90 tablet 1  . Multiple Vitamin (MULTIVITAMIN WITH MINERALS) TABS Take 1 tablet by mouth daily.    [name] omeprazole (PRILOSEC) 40 MG capsule TAKE 1 CAPSULE BY MOUTH DAILY 90 capsule 3  . polyethylene glycol powder (GLYCOLAX/MIRALAX) powder Take  17 g of powder with 4-8 ounces of fluid daily for constipation. 255 g 3  . spironolactone (ALDACTONE) 25 MG tablet Take 1 tablet (25 mg total) by mouth daily. 90 tablet 3   No current facility-administered medications for this encounter.    Vitals:   05/11/17 0935  BP: (!) 128/92  Pulse: 86  SpO2: 98%  Weight: 269 lb 9.6 oz (122.3 kg)   Wt Readings from Last 3 Encounters:  05/11/17 269 lb 9.6 oz (122.3 kg)  03/30/17 271 lb (122.9 kg)  03/27/17 275 lb 3.2 oz (124.8 kg)     Physical Exam General: Well appearing. No resp difficulty. HEENT: Normal Neck: Supple. JVP difficult, does not appear elevated. Carotids 2+ bilat; no bruits. No thyromegaly or nodule noted. Cor: PMI nondisplaced. RRR, No M/G/R noted Lungs: CTAB, normal effort. Abdomen: Soft, non-tender, non-distended, no HSM. No bruits or masses. +BS  Extremities: No cyanosis, clubbing, or rash. R and LLE no edema.  Neuro: Alert & orientedx3, cranial nerves grossly intact. moves all 4 extremities w/o difficulty. Affect pleasant   Assessment/Plan:  1. Chronic systolic HF Nonischemic cardiomyopathy, possibly ETOH-related.  Echo in 5/15 with EF 30-35%, improved on 3/17 echo to EF 40-45%. Echo in 8/18 with EF back down to 25-30%.  - NYHA II symptoms - Volume status stable on exam  - Continue lasix 60 mg q am and 40 mg q pm. BMET today.  - Continue coreg 25 mg BID - Continue losartan 100 mg daily - Continue spiro 25 mg daily.  - He cannot take Bidil due to headaches. - Cannot transition losartan to Entresto given angioedema risk (had angioedema with ACEI). - Will re-order cMRI. If too large, will repeat Echo. If EF remains low will be ICD candidate.  2. OSA  - Encouraged nightly CPAP.  3. Tobacco abuse - Smoking 1/2 ppd. Encouraged cessation.  - Using Wellbutrin.  4. ETOH Abuse - Drinking about 1/2 pint a day.  - Encouraged complete cessation.   BP well controlled on current meds. No meds to add with Bidil intolerance.  Plan cMRI vs Echo. If EF remains low will be ICD candidate. Labs today. RTC 3-4 months.  Ethan Freer, PA-C  05/11/2017   Greater than 50% of the 25 minute visit was spent in counseling/coordination of care regarding disease state education, salt/fluid restriction, sliding scale diuretics, and medication compliance.

## 2017-05-23 ENCOUNTER — Encounter: Payer: Self-pay | Admitting: Student

## 2017-05-23 ENCOUNTER — Telehealth: Payer: Self-pay | Admitting: Student

## 2017-05-23 NOTE — Telephone Encounter (Signed)
Called patient and LVM regarding cardiac MRI scheduled 05-29-17 at 10 a.m.  Letter mailed today.

## 2017-05-28 ENCOUNTER — Ambulatory Visit
Admission: RE | Admit: 2017-05-28 | Discharge: 2017-05-28 | Disposition: A | Discharge status: Home / Self Care | Payer: Medicaid Other | Source: Ambulatory Visit | Attending: Gastroenterology | Admitting: Gastroenterology

## 2017-05-28 ENCOUNTER — Other Ambulatory Visit: Payer: Self-pay | Admitting: Gastroenterology

## 2017-05-28 DIAGNOSIS — R1084 Generalized abdominal pain: Secondary | ICD-10-CM

## 2017-05-29 ENCOUNTER — Other Ambulatory Visit: Payer: Self-pay

## 2017-05-29 ENCOUNTER — Encounter (HOSPITAL_COMMUNITY): Payer: Self-pay

## 2017-05-29 ENCOUNTER — Observation Stay (HOSPITAL_COMMUNITY)
Admission: EM | Admit: 2017-05-29 | Discharge: 2017-05-30 | Disposition: A | Discharge status: Home / Self Care | Payer: Medicaid Other | Attending: Internal Medicine | Admitting: Internal Medicine

## 2017-05-29 ENCOUNTER — Emergency Department (HOSPITAL_COMMUNITY): Payer: Medicaid Other

## 2017-05-29 ENCOUNTER — Ambulatory Visit (HOSPITAL_COMMUNITY)
Admission: RE | Admit: 2017-05-29 | Discharge: 2017-05-29 | Disposition: A | Discharge status: Home / Self Care | Payer: Medicaid Other | Source: Ambulatory Visit | Attending: Cardiology | Admitting: Cardiology

## 2017-05-29 DIAGNOSIS — E669 Obesity, unspecified: Secondary | ICD-10-CM | POA: Diagnosis not present

## 2017-05-29 DIAGNOSIS — Z560 Unemployment, unspecified: Secondary | ICD-10-CM

## 2017-05-29 DIAGNOSIS — R55 Syncope and collapse: Secondary | ICD-10-CM | POA: Diagnosis not present

## 2017-05-29 DIAGNOSIS — I5020 Unspecified systolic (congestive) heart failure: Secondary | ICD-10-CM

## 2017-05-29 DIAGNOSIS — Z880 Allergy status to penicillin: Secondary | ICD-10-CM

## 2017-05-29 DIAGNOSIS — N179 Acute kidney failure, unspecified: Secondary | ICD-10-CM | POA: Diagnosis not present

## 2017-05-29 DIAGNOSIS — F1010 Alcohol abuse, uncomplicated: Secondary | ICD-10-CM | POA: Diagnosis not present

## 2017-05-29 DIAGNOSIS — I110 Hypertensive heart disease with heart failure: Secondary | ICD-10-CM

## 2017-05-29 DIAGNOSIS — F17210 Nicotine dependence, cigarettes, uncomplicated: Secondary | ICD-10-CM

## 2017-05-29 DIAGNOSIS — Z7982 Long term (current) use of aspirin: Secondary | ICD-10-CM | POA: Insufficient documentation

## 2017-05-29 DIAGNOSIS — B1920 Unspecified viral hepatitis C without hepatic coma: Secondary | ICD-10-CM | POA: Diagnosis not present

## 2017-05-29 DIAGNOSIS — I951 Orthostatic hypotension: Secondary | ICD-10-CM | POA: Diagnosis not present

## 2017-05-29 DIAGNOSIS — I5042 Chronic combined systolic (congestive) and diastolic (congestive) heart failure: Secondary | ICD-10-CM | POA: Diagnosis not present

## 2017-05-29 DIAGNOSIS — I428 Other cardiomyopathies: Secondary | ICD-10-CM

## 2017-05-29 DIAGNOSIS — F1411 Cocaine abuse, in remission: Secondary | ICD-10-CM

## 2017-05-29 DIAGNOSIS — Z91013 Allergy to seafood: Secondary | ICD-10-CM

## 2017-05-29 DIAGNOSIS — Z8249 Family history of ischemic heart disease and other diseases of the circulatory system: Secondary | ICD-10-CM

## 2017-05-29 DIAGNOSIS — I429 Cardiomyopathy, unspecified: Secondary | ICD-10-CM

## 2017-05-29 DIAGNOSIS — Z888 Allergy status to other drugs, medicaments and biological substances status: Secondary | ICD-10-CM

## 2017-05-29 DIAGNOSIS — M109 Gout, unspecified: Secondary | ICD-10-CM | POA: Diagnosis not present

## 2017-05-29 DIAGNOSIS — G4733 Obstructive sleep apnea (adult) (pediatric): Secondary | ICD-10-CM | POA: Diagnosis not present

## 2017-05-29 DIAGNOSIS — R740 Nonspecific elevation of levels of transaminase and lactic acid dehydrogenase [LDH]: Secondary | ICD-10-CM

## 2017-05-29 DIAGNOSIS — Z79899 Other long term (current) drug therapy: Secondary | ICD-10-CM

## 2017-05-29 DIAGNOSIS — E860 Dehydration: Secondary | ICD-10-CM | POA: Diagnosis not present

## 2017-05-29 DIAGNOSIS — F1210 Cannabis abuse, uncomplicated: Secondary | ICD-10-CM

## 2017-05-29 LAB — URINALYSIS, ROUTINE W REFLEX MICROSCOPIC
BACTERIA UA: NONE SEEN
Bilirubin Urine: NEGATIVE
Glucose, UA: NEGATIVE mg/dL
Ketones, ur: NEGATIVE mg/dL
Leukocytes, UA: NEGATIVE
NITRITE: NEGATIVE
PH: 5 (ref 5.0–8.0)
Protein, ur: 30 mg/dL — AB
SPECIFIC GRAVITY, URINE: 1.025 (ref 1.005–1.030)

## 2017-05-29 LAB — COMPREHENSIVE METABOLIC PANEL
ALT: 31 U/L (ref 17–63)
AST: 33 U/L (ref 15–41)
Albumin: 3.4 g/dL — ABNORMAL LOW (ref 3.5–5.0)
Alkaline Phosphatase: 69 U/L (ref 38–126)
Anion gap: 15 (ref 5–15)
BUN: 17 mg/dL (ref 6–20)
CALCIUM: 9.3 mg/dL (ref 8.9–10.3)
CO2: 26 mmol/L (ref 22–32)
CREATININE: 2.31 mg/dL — AB (ref 0.61–1.24)
Chloride: 97 mmol/L — ABNORMAL LOW (ref 101–111)
GFR, EST AFRICAN AMERICAN: 34 mL/min — AB (ref >60)
GFR, EST NON AFRICAN AMERICAN: 29 mL/min — AB (ref >60)
Glucose, Bld: 131 mg/dL — ABNORMAL HIGH (ref 65–99)
Potassium: 4 mmol/L (ref 3.5–5.1)
Sodium: 138 mmol/L (ref 135–145)
Total Bilirubin: 1.1 mg/dL (ref 0.3–1.2)
Total Protein: 7 g/dL (ref 6.5–8.1)

## 2017-05-29 LAB — CBC WITH DIFFERENTIAL/PLATELET
ABS IMMATURE GRANULOCYTES: 0.2 10*3/uL — AB (ref 0.0–0.1)
BASOS PCT: 0 %
Basophils Absolute: 0 10*3/uL (ref 0.0–0.1)
EOS PCT: 1 %
Eosinophils Absolute: 0.1 10*3/uL (ref 0.0–0.7)
HCT: 44.9 % (ref 39.0–52.0)
Hemoglobin: 14.9 g/dL (ref 13.0–17.0)
Immature Granulocytes: 2 %
Lymphocytes Relative: 24 %
Lymphs Abs: 2.2 10*3/uL (ref 0.7–4.0)
MCH: 31 pg (ref 26.0–34.0)
MCHC: 33.2 g/dL (ref 30.0–36.0)
MCV: 93.5 fL (ref 78.0–100.0)
Monocytes Absolute: 0.7 10*3/uL (ref 0.1–1.0)
Monocytes Relative: 7 %
NEUTROS PCT: 66 %
Neutro Abs: 6.4 10*3/uL (ref 1.7–7.7)
PLATELETS: 256 10*3/uL (ref 150–400)
RBC: 4.8 MIL/uL (ref 4.22–5.81)
RDW: 13.3 % (ref 11.5–15.5)
WBC: 9.6 10*3/uL (ref 4.0–10.5)

## 2017-05-29 LAB — I-STAT CG4 LACTIC ACID, ED
LACTIC ACID, VENOUS: 2.7 mmol/L — AB (ref 0.5–1.9)
Lactic Acid, Venous: 3.53 mmol/L (ref 0.5–1.9)

## 2017-05-29 LAB — I-STAT TROPONIN, ED: Troponin i, poc: 0.09 ng/mL (ref 0.00–0.08)

## 2017-05-29 LAB — PROTIME-INR
INR: 1.05
PROTHROMBIN TIME: 13.6 s (ref 11.4–15.2)

## 2017-05-29 LAB — MAGNESIUM: MAGNESIUM: 2.3 mg/dL (ref 1.7–2.4)

## 2017-05-29 LAB — PHOSPHORUS: PHOSPHORUS: 5.7 mg/dL — AB (ref 2.5–4.6)

## 2017-05-29 LAB — D-DIMER, QUANTITATIVE: D-Dimer, Quant: 0.27 ug/mL-FEU (ref 0.00–0.50)

## 2017-05-29 LAB — BRAIN NATRIURETIC PEPTIDE: B Natriuretic Peptide: 73.6 pg/mL (ref 0.0–100.0)

## 2017-05-29 LAB — TROPONIN I: TROPONIN I: 0.13 ng/mL — AB (ref <0.03)

## 2017-05-29 MED ORDER — SODIUM CHLORIDE 0.9 % IV BOLUS: 1000.0000 mL | Freq: Once | INTRAVENOUS | Status: DC

## 2017-05-29 MED ORDER — ENOXAPARIN SODIUM 30 MG/0.3ML ~~LOC~~ SOLN: 30.0000 mg | SUBCUTANEOUS | Status: DC

## 2017-05-29 MED ORDER — ASPIRIN EC 81 MG PO TBEC
81.0000 mg | DELAYED_RELEASE_TABLET | Freq: Every day | ORAL | Status: DC
  Administered 2017-05-30: 81 mg via ORAL
  Filled 2017-05-29: qty 1

## 2017-05-29 MED ORDER — SODIUM CHLORIDE 0.9 % IV BOLUS
500.0000 mL | Freq: Once | INTRAVENOUS | Status: AC
  Administered 2017-05-29: 500 mL via INTRAVENOUS

## 2017-05-29 MED ORDER — ONDANSETRON HCL 4 MG PO TABS: 4.0000 mg | ORAL_TABLET | Freq: Four times a day (QID) | ORAL | Status: DC | PRN

## 2017-05-29 MED ORDER — ACETAMINOPHEN 325 MG PO TABS: 650.0000 mg | ORAL_TABLET | Freq: Four times a day (QID) | ORAL | Status: DC | PRN

## 2017-05-29 MED ORDER — ACETAMINOPHEN 650 MG RE SUPP: 650.0000 mg | Freq: Four times a day (QID) | RECTAL | Status: DC | PRN

## 2017-05-29 MED ORDER — ONDANSETRON HCL 4 MG/2ML IJ SOLN: 4.0000 mg | Freq: Four times a day (QID) | INTRAMUSCULAR | Status: DC | PRN

## 2017-05-29 MED ORDER — PANTOPRAZOLE SODIUM 40 MG PO TBEC
40.0000 mg | DELAYED_RELEASE_TABLET | Freq: Every day | ORAL | Status: DC
  Administered 2017-05-30: 40 mg via ORAL
  Filled 2017-05-29: qty 1

## 2017-05-29 NOTE — H&P (Addendum)
Date: 05/29/2017               Patient Name:  Ethan Wilcox MRN: [id number]  DOB: 1955/01/21 Age / Sex: 62 y.o., male   PCP: Eulah Pont, MD         Medical Service: Internal Medicine Teaching Service         Attending Physician: Dr. Corlis Leak, Cindee Salt, *    First Contact: Dr. Lorenso Courier Pager: [phone]  Second Contact: Dr. Reymundo Poll Pager: [phone]       After Hours (After 5p/  First Contact Pager: [phone]  weekends / holidays): Second Contact Pager: [phone]   Chief Complaint: Syncope  History of Present Illness:  Mr. Viana is a 62 y.o m with nonischemic cardiomyopathy, HFrEF (25-30%), OSA, essential hypertension, gout, obesity, hep c, and polysubstance abuse (alcohol, marijuana, and prior cocaine use) presenting after a syncopal episode while getting an outpatient cardiac MRI. Patient reports he underwent a colonoscopy on Friday 5/17. That evening he developed diffuse abdominal pain that was 8/10 intensity and burning in nature. He took tums, gas-x, and milk of magnesium without relief. He called his gastroenterologist who told him it was likely gas pain. He denied nausea and vomiting. For the next two days, he was unable to eat or drink because of the pain. Yesterday he took cough medicine to sooth the pain which helped, but then today developed generalized weakness.  The patient went to get an outpatient cardiac MRI today, but when he was trying to stand up from wheelchair to get to MRI machine he stated that he "got cloudy and lightheaded". He denied any feeling of his heart racing or a prodromal feeling prior to this. Rapid response was called and the patient was sent to the ED from MRI.   The patient states that he currently drinks alcohol and uses marijuana. He last drank on Friday night (05/25/17) 1 pint of vodka.   ED Course:  Hypotensive to 86/57, tachy-brady with HR range of 43-122, and spo2=92-98% on RA. Labs were significant for an AKI with Scr 2.31 (bl ~  1.1) and lactic acid of 3.53. I-stat troponin was 0.09. He was given two 500 cc NS bolus with improvement in his BP to 121/88.   Meds:  No outpatient medications have been marked as taking for the 05/29/17 encounter [facility] Encounter).     Allergies: Allergies as of 05/29/2017 - Review Complete 05/11/2017  Allergen Reaction Noted  . Lisinopril Anaphylaxis 08/28/2012  . Shrimp [shellfish allergy] Anaphylaxis 10/19/2012  . Bidil [isosorb dinitrate-hydralazine]  09/04/2016  . Penicillins Other (See Comments) 06/04/2009   Past Medical History:  Diagnosis Date  . Active smoker /   1/2 ppd  . Chronic combined systolic and diastolic CHF (congestive heart failure) (HCC)   . Cocaine abuse (HCC)   . ETOH abuse   . GERD (gastroesophageal reflux disease)   . Gout   . HCV (hepatitis C virus)   . Hx of anaphylaxis with angioedema 08/23/2012   Admitted with angioedema on 08/24/2012 No intubation Unknown allergen Shrimp Vs Spices Vs ACEis Stopped Lisinopril   . Hypertension   . Morbid obesity (HCC)   . Nonischemic cardiomyopathy (HCC)    admitted 5/11 w acute systolic CHF. LHC showed no angiographic CAD. Echo (5/11) showed EF 15-20% w diffuse sever hypokinesis, moderate MR> RHC showed PCWP 10, CI 1.47 (Fick.) cause of CMP thought to be heavy ETOH and cocaine. No ICD due to ongoing ETOH intake  .  Sleep apnea     Family History: Mom deceased from lung cancer (smoker). Father with heart disease deceased at age 58.   Family History  Problem Relation Age of Onset  . Heart disease Mother   . Cancer Mother   . Heart disease Father    Social History: Patient drinks a pint of vodka every other day. Also drink other liquors and beer. Last drink was Friday 5/17 because of the abdominal pain. He reports a history of alcohol withdrawal including DTs during a prior hospitalization. Smokes 1/2 PPD for over 20 years. Denies illicit substance currently. Endorses a prior history of cocaine abuse 20 years ago.  Smokes marijuana "off and on".   Social History   Socioeconomic History  . Marital status: Single    Spouse name: Not on file  . Number of children: Not on file  . Years of education: Not on file  . Highest education level: Not on file  Occupational History  . Occupation: DISABLED    Employer: UNEMPLOYED  Social Needs  . Financial resource strain: Not on file  . Food insecurity:    Worry: Not on file    Inability: Not on file  . Transportation needs:    Medical: Not on file    Non-medical: Not on file  Tobacco Use  . Smoking status: Current Every Day Smoker    Packs/day: 0.50    Years: 38.00    Pack years: 19.00    Types: Cigarettes    Start date: 01/10/1972  . Smokeless tobacco: Current User  . Tobacco comment: 1/2 pack/day  Substance and Sexual Activity  . Alcohol use: Yes    Alcohol/week: 7.2 oz    Types: 12 Shots of liquor per week    Comment: Vodka 1/2 pint - on/off.  . Drug use: No    Types: Cocaine    Comment: none in over 20 yrs per patient  . Sexual activity: Not Currently  Lifestyle  . Physical activity:    Days per week: Not on file    Minutes per session: Not on file  . Stress: Not on file  Relationships  . Social connections:    Talks on phone: Not on file    Gets together: Not on file    Attends religious service: Not on file    Active member of club or organization: Not on file    Attends meetings of clubs or organizations: Not on file    Relationship status: Not on file  Other Topics Concern  . Not on file  Social History Narrative   Works as G[employer]. 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 Heavy ETOH in past, now back to drinking 1/2 pint/week.          No show 07/16/09   Review of Systems: A complete ROS was negative except as per HPI. Denies headaches, blurry vision, chest pain, or shortness of breath. He states that he has been cramping up.  Physical Exam: Blood pressure 97/62, pulse 87, temperature 97.8 F (36.6 C), resp. rate (!) 25, height  5\' 7"  (1.702 m), weight 252 lb (114.3 kg), SpO2 92 %.  Physical Exam  Constitutional: He appears well-developed and well-nourished. No distress.  Obese body habitus  HENT:  Head: Normocephalic and atraumatic.  Mouth/Throat: Oropharynx is clear and moist.  Eyes: Conjunctivae are normal.  Cardiovascular: Normal rate, regular rhythm, normal heart sounds and intact distal pulses.  No murmur heard. Respiratory: Effort normal and breath sounds normal. No respiratory distress. He  has no wheezes.  GI: Soft. Bowel sounds are normal. He exhibits no distension. There is no tenderness.  Musculoskeletal: He exhibits no edema.  Neurological: He is alert.  Skin: He is not diaphoretic. No erythema.  No skin tenting or pallor  Psychiatric: He has a normal mood and affect. His behavior is normal. Judgment and thought content normal.   EKG: personally reviewed my interpretation is sinus rhythm but irregular with PVCs, no ST changes, T wave inversions.  CXR: personally reviewed my interpretation is mild bibasilar atelectasis   CT head: without any acute abnormality  Assessment & Plan by Problem:  Mr. Glazebrook is a 62 y.o m with nonischemic cardiomyopathy, HFrEF (25-30%), OSA, essential hypertension, and polysubstance abuse who presented post feeling lightheaded and dizzy when he was standing up prior to getting his outpatient cardiac MRI done.  Syncope  The patient stated that he felt lightheaded and dizzy for a few minutes after getting up from his wheelchair to get to the MRI machine. The patient denied any chest pain, palpitations, or feeling of his heart racing prior to the event.   The patient has been having decreased oral intake since Friday 05/25/17 which likely has caused the patient to become hypovolemic and contribute to his orthostatic syncope. The patient's aki and lactic acidosis are furthermore indicative of the patient's dehydrated state. The patient's orthostatic vital signs were negative,  but they were collected after the patient received IV fluids.   -Placed on cardiac monitoring -PT/OT evaluation  -Patient's troponin was elevated to 0.09, but appears to be chronically elevated. Will repeat troponin to trend.  -Lactic acid decreased 3.53 to 2.70 -Patient given heart healthy diet to help with syncope  Acute Kidney Injury The patient's creatinine is 2.31 which is increased from baseline of 0.9-1.2.  -Patient was given a total of 1L IV fluids. Snce the patient's orthostatic vital signs were negative after giving fluids we opted to monitor patient's status with diet as patient also has HFrEF.   HFrEF LVEF 25-30%, no regional wall motion abnormalities, G1DD,  moderate concentric hypertrophy seen in left ventricle per echo in August 2018.   Patient is on carvedilol 25mg  bid, lasix 60mg  in am and 40mg  in pm, losartan 100mg  qd, spironolactone 25mg  qd.   -Holding patient's heart failure medication  Essential Hypertension  Patient's blood pressure since admission has ranged 86-121/57-88. The patient has been hypovolemic and therefore will hold home blood pressure medication at this time.   Polysubstance abuse The patient states that he usually drinks 1 pint of vodka/brown liquer/gin/beer every other day. His last drink was Friday.   -Placed on CIWA  Dispo: Admit patient to Observation with expected length of stay less than 2 midnights.  SignedLorenso Courier, MD 05/29/2017, 2:13 PM  Pager: [phone]

## 2017-05-29 NOTE — ED Notes (Signed)
NS started in MRI

## 2017-05-29 NOTE — ED Provider Notes (Signed)
[facility] EMERGENCY DEPARTMENT Provider Note   CSN: [id number] Arrival date & time: 05/29/17  1123     History   Chief Complaint Chief Complaint  Patient presents with  . Near Syncope    HPI Ethan Wilcox is a 62 y.o. male.  HPI   Patient is a 62 year old male with past medical history significant for hepatitis C, cocaine abuse, alcohol abuse, gout, morbid obesity, hypertension, sleep apnea, nonischemic cardiomyopathy.  He is presenting today with syncope while getting outpatient MRI.  Patient was getting a cardiac MRI today.  The transferred him from sitting to stand up to going to MRI and patient syncopized.  Initial blood pressure is very low.  Patient brought here to the emergency department.  Patient has had  A couple days of not feeling himself.  Patient's last drink was Friday.  Patient had a colonoscopy on Friday and then drank.  But since then has not had drinks because he is felt unwell.  He a little bit of epigastric pain.  This is since resolved.  No black stool.  Patient reports having DTs one time in the past.  Patient does not have any shakiness or unsteadiness or anxiety currently.  No abdominal pain currently.  No chest pain, or other complaints currently.  Past Medical History:  Diagnosis Date  . Active smoker /   1/2 ppd  . Chronic combined systolic and diastolic CHF (congestive heart failure) (HCC)   . Cocaine abuse (HCC)   . ETOH abuse   . GERD (gastroesophageal reflux disease)   . Gout   . HCV (hepatitis C virus)   . Hx of anaphylaxis with angioedema 08/23/2012   Admitted with angioedema on 08/24/2012 No intubation Unknown allergen Shrimp Vs Spices Vs ACEis Stopped Lisinopril   . Hypertension   . Morbid obesity (HCC)   . Nonischemic cardiomyopathy (HCC)    admitted 5/11 w acute systolic CHF. LHC showed no angiographic CAD. Echo (5/11) showed EF 15-20% w diffuse sever hypokinesis, moderate MR> RHC showed PCWP 10, CI 1.47 (Fick.) cause  of CMP thought to be heavy ETOH and cocaine. No ICD due to ongoing ETOH intake  . Sleep apnea     Patient Active Problem List   Diagnosis Date Noted  . Syncope 05/29/2017  . Generalized abdominal pain 03/27/2017  . Elbow pain, left 02/21/2017  . Hyperglycemia 10/10/2016  . Elevated serum creatinine 05/01/2016  . Fatigue 05/01/2016  . 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 Chronic hepatitis C without hepatic coma (HCC) 06/09/2015  . Health care maintenance 05/24/2015  . Hepatitis C antibody test positive 03/31/2015  . Microscopic hematuria 03/31/2015  . Cardiomyopathy (HCC)   . Acute bronchitis 03/09/2015  . Nonischemic cardiomyopathy (HCC) 02/10/2015  . Headache 02/10/2015  . Osteoarthritis of left shoulder 12/09/2014  . Dyspnea 07/21/2014  . Hyperlipidemia 07/21/2014  . External hemorrhoid 10/22/2013  . Obesity, morbid (HCC) 07/30/2013  . Depression 02/26/2013  . Gastroesophageal reflux disease 02/26/2013  . Arthritis 02/26/2013  . Illiteracy 08/28/2012  . Hx of Anaphylaxis 08/23/2012  . Severe OSA 11/27/2010  . Systolic CHF 08/03/2009  . Alcohol abuse 06/04/2009  . TOBACCO ABUSE 06/04/2009  . HYPERTENSION 06/04/2009    Past Surgical History:  Procedure Laterality Date  . NO PAST SURGERIES          Home Medications    Prior to Admission medications   Medication Sig Start Date End Date Taking? Authorizing Provider  albuterol (PROVENTIL HFA;VENTOLIN HFA) 108 (90 Base) MCG/ACT  inhaler Inhale 2 puffs into the lungs every 4 (four) hours as needed for wheezing or shortness of breath. 03/12/15   Lora Paula, MD  aspirin (ASPIR-LOW) 81 MG EC tablet Take 81 mg by mouth daily.     [provider]  buPROPion (WELLBUTRIN) 75 MG tablet Take 1 tablet (75 mg total) by mouth 2 (two) times daily. 02/16/17   Laurey Morale, MD  carvedilol (COREG) 25 MG tablet TAKE 1 TABLET BY MOUTH TWICE A DAY 01/11/17   [name], MD  Dextromethorphan-Benzocaine (COUGH/SORE THROAT  LOZENGES MT) Use as directed 1-3 drops in the mouth or throat daily as needed (cough).    [provider]  diphenhydrAMINE (BENADRYL) 25 MG tablet Take 25 mg by mouth every 6 (six) hours as needed for allergies. Reported on 05/24/2015    [provider]  fenofibrate (TRICOR) 145 MG tablet Take 1 tablet (145 mg total) by mouth daily. 06/02/15   Bensimhon, Bevelyn Buckles, MD  furosemide (LASIX) 40 MG tablet Take 60 mg (1.5 tabs) in AM, then 40 mg (1 tab) in  PM 02/16/17   Laurey Morale, MD  guaifenesin (ROBITUSSIN) 100 MG/5ML syrup Take 200 mg by mouth 3 (three) times daily as needed for cough.    [provider]  losartan (COZAAR) 100 MG tablet Take 1 tablet (100 mg total) by mouth daily. 10/10/16   [name], MD  Multiple Vitamin (MULTIVITAMIN WITH MINERALS) TABS Take 1 tablet by mouth daily.    [provider]  omeprazole (PRILOSEC) 40 MG capsule TAKE 1 CAPSULE BY MOUTH DAILY 08/02/16   [name], MD  polyethylene glycol powder (GLYCOLAX/MIRALAX) powder Take 17 g of powder with 4-8 ounces of fluid daily for constipation. 10/10/16   [name], MD  spironolactone (ALDACTONE) 25 MG tablet Take 1 tablet (25 mg total) by mouth daily. 02/21/17   Eulah Pont, MD    Family History Family History  Problem Relation Age of Onset  . Heart disease Mother   . Cancer Mother   . Heart disease Father     Social History Social History   Tobacco Use  . Smoking status: Current Every Day Smoker    Packs/day: 0.50    Years: 38.00    Pack years: 19.00    Types: Cigarettes    Start date: 01/10/1972  . Smokeless tobacco: Current User  . Tobacco comment: 1/2 pack/day  Substance Use Topics  . Alcohol use: Yes    Alcohol/week: 7.2 oz    Types: 12 Shots of liquor per week    Comment: Vodka 1/2 pint - on/off.  . Drug use: No    Types: Cocaine    Comment: none in over 20 yrs per patient     Allergies   Lisinopril; Shrimp [shellfish allergy]; Bidil [isosorb  dinitrate-hydralazine]; and Penicillins   Review of Systems Review of Systems  Constitutional: Positive for fatigue. Negative for activity change and fever.  Respiratory: Negative for shortness of breath.   Cardiovascular: Negative for chest pain.  Gastrointestinal: Positive for abdominal pain.  Neurological: Positive for syncope.  All other systems reviewed and are negative.    Physical Exam Updated Vital Signs BP 109/85   Pulse (!) 122   Temp 97.8 F (36.6 C)   Resp (!) 21   Ht 5\' 7"  (1.702 m)   Wt 114.3 kg (252 lb)   SpO2 94%   BMI 39.47 kg/m   Physical Exam  Constitutional: He is oriented to person, place, and  time. He appears well-nourished.  Morbidly obese African-American male sitting comfortably  HENT:  Head: Normocephalic.  Eyes: Conjunctivae are normal.  Cardiovascular: Normal rate and regular rhythm.  No murmur heard. Pulmonary/Chest: Effort normal and breath sounds normal. No respiratory distress.  Abdominal: Soft. He exhibits no distension. There is no tenderness.  Neurological: He is oriented to person, place, and time.  Skin: Skin is warm and dry. He is not diaphoretic.  Psychiatric: He has a normal mood and affect. His behavior is normal.  Nursing note and vitals reviewed.    ED Treatments / Results  Labs (all labs ordered are listed, but only abnormal results are displayed) Labs Reviewed  COMPREHENSIVE METABOLIC PANEL - Abnormal; Notable for the following components:      Result Value   Chloride 97 (*)    Glucose, Bld 131 (*)    Creatinine, Ser 2.31 (*)    Albumin 3.4 (*)    GFR calc non Af Amer 29 (*)    GFR calc Af Amer 34 (*)    All other components within normal limits  CBC WITH DIFFERENTIAL/PLATELET - Abnormal; Notable for the following components:   Abs Immature Granulocytes 0.2 (*)    All other components within normal limits  I-STAT TROPONIN, ED - Abnormal; Notable for the following components:   Troponin i, poc 0.09 (*)    All  other components within normal limits  I-STAT CG4 LACTIC ACID, ED - Abnormal; Notable for the following components:   Lactic Acid, Venous 3.53 (*)    All other components within normal limits  PROTIME-INR  BRAIN NATRIURETIC PEPTIDE  D-DIMER, QUANTITATIVE (NOT AT [facility])  URINALYSIS, ROUTINE W REFLEX MICROSCOPIC  I-STAT CG4 LACTIC ACID, ED    EKG EKG Interpretation  Date/Time:  Tuesday May 29 2017 13:08:40 EDT Ventricular Rate:  87 PR Interval:    QRS Duration: 98 QT Interval:  395 QTC Calculation: 476 R Axis:   0 Text Interpretation:  Sinus rhythm Multiform ventricular premature complexes Consider left atrial enlargement Nonspecific T abnormalities, lateral leads Borderline prolonged QT interval Normal sinus rhythm Confirmed by Corlis Leak, Courteney ([id number]) on 05/29/2017 2:32:24 PM   Radiology Dg Chest 2 View  Result Date: 05/29/2017 CLINICAL DATA:  Dizziness today, hypertension, smoker EXAM: CHEST - 2 VIEW COMPARISON:  08/09/2016 FINDINGS: Upper normal heart size. Mediastinal contours and pulmonary vascularity normal. Mild bibasilar atelectasis. Upper lungs clear. No pleural effusion or pneumothorax. IMPRESSION: Mild bibasilar atelectasis. Electronically Signed   By: Ulyses Southward M.D.   On: 05/29/2017 12:47   Ct Head Wo Contrast  Result Date: 05/29/2017 CLINICAL DATA:  Dizziness and headaches EXAM: CT HEAD WITHOUT CONTRAST TECHNIQUE: Contiguous axial images were obtained from the base of the skull through the vertex without intravenous contrast. COMPARISON:  02/28/2011 FINDINGS: Brain: No evidence of acute infarction, hemorrhage, hydrocephalus, extra-axial collection or mass lesion/mass effect. Vascular: No hyperdense vessel or unexpected calcification. Skull: Normal. Negative for fracture or focal lesion. Sinuses/Orbits: No acute finding. Other: None. IMPRESSION: No acute abnormality noted. Electronically Signed   By: Alcide Clever M.D.   On: 05/29/2017 14:19   Dg Abd 2 Views  Result  Date: 05/28/2017 CLINICAL DATA:  Acute BILATERAL lower quadrant abdominal pain since colonoscopy 3 days ago, diaphoresis EXAM: ABDOMEN - 2 VIEW COMPARISON:  None FINDINGS: Atelectasis versus infiltrate LEFT lower lobe. Nonobstructive bowel gas pattern. No bowel dilatation, bowel wall thickening or free air. Calcifications in pelvis likely phleboliths. No definite urinary tract calcification or acute bony lesion. IMPRESSION: Nonobstructive  bowel gas pattern. Atelectasis versus infiltrate in LEFT lower lobe. Electronically Signed   By: Ulyses Southward M.D.   On: 05/28/2017 17:57    Procedures Procedures (including critical care time)  CRITICAL CARE Performed by: Arlana Hove Total critical care time: 45 minutes Critical care time was exclusive of separately billable procedures and treating other patients. Critical care was necessary to treat or prevent imminent or life-threatening deterioration. Critical care was time spent personally by me on the following activities: development of treatment plan with patient and/or surrogate as well as nursing, discussions with consultants, evaluation of patient's response to treatment, examination of patient, obtaining history from patient or surrogate, ordering and performing treatments and interventions, ordering and review of laboratory studies, ordering and review of radiographic studies, pulse oximetry and re-evaluation of patient's condition.   Medications Ordered in ED Medications  sodium chloride 0.9 % bolus 500 mL (0 mLs Intravenous Stopped 05/29/17 1405)  sodium chloride 0.9 % bolus 500 mL (500 mLs Intravenous New Bag/Given 05/29/17 1340)     Initial Impression / Assessment and Plan / ED Course  I have reviewed the triage vital signs and the nursing notes.  Pertinent labs & imaging results that were available during my care of the patient were reviewed by me and considered in my medical decision making (see chart for details).     Patient is  a 62 year old male with past medical history significant for hepatitis C, cocaine abuse, alcohol abuse, gout, morbid obesity, hypertension, sleep apnea, nonischemic cardiomyopathy.  He is presenting today with syncope while getting outpatient MRI.  Patient was getting a cardiac MRI today.  The transferred him from sitting to stand up to going to MRI and patient syncopized.  Initial blood pressure is very low.  Patient brought here to the emergency department.  Patient has had  A couple days of not feeling himself.  Patient's last drink was Friday.  Patient had a colonoscopy on Friday and then drank.  But since then has not had drinks because he is felt unwell.  He a little bit of epigastric pain.  This is since resolved.  No black stool.  Patient reports having DTs one time in the past.  Patient does not have any shakiness or unsteadiness or anxiety currently.  No abdominal pain currently.  No chest pain, or other complaints currently.  12:29 PM Patient has no real symptoms at this time.  However given this syncope, will do broad-spectrum work-up.  Will get lactic, troponin, EKG.   1:36 PM Went and recheck patient's blood pressure.  Remains the same.  However we have been unable to give any fluids yet.  Have now hung a liter of fluid and so it anticipate improvement of symptoms vital signs.  Patient really appears very well and he is asking to go home.  However with an AKI, and continued low blood pressures, will likely need to admit.  I think this is all stemming from dehydration.  Doubt infection.  Patient had a colonoscopy in Friday and really has not been able to eat or drink much since then.  Patient has no fever.  No white count.  And no focal symptoms.  We will hold off on broad-spectrum antibiotics at this time.  I think lactic is likely due to hypoperfusion secondary to dehydration.  2:29 PM Blood pressures improved after 500 cc bolus.Will admit for repeat Cr, and monitoring of vital signs.    Final Clinical Impressions(s) / ED Diagnoses   Final diagnoses:  None    ED Discharge Orders    None       Abelino Derrick, MD 05/29/17 1432

## 2017-05-29 NOTE — Progress Notes (Signed)
Rapid Response called for pt having diaphoresis, possible seizure event while being prepped for MRI.  Pt alert and oriented upon arrival.  Placed on O2 at 2 lpm sat 98%.  Pt stable and being transferred to ED.

## 2017-05-29 NOTE — Progress Notes (Signed)
Notified on call MD for internal medicine of troponin 0.13.

## 2017-05-29 NOTE — ED Notes (Signed)
Transported to CT via stretcher per rad tech  

## 2017-05-29 NOTE — ED Notes (Signed)
From radiology via stretcher per rad tech

## 2017-05-29 NOTE — ED Notes (Signed)
Despite present hypotension as noted on VS flowsheet, pt awake, alert, conversant, skin warm, dry, sitting upright on bedside without complaint

## 2017-05-29 NOTE — ED Triage Notes (Signed)
MRI called as a Rapid Response call for a syncopal episode. Per staff pt stood up became dizzy and diaphoretic. Pt reports he drinks a pint of vodka or gin daily, last drink was Saturday. Pt states he had a colonoscopy on Friday began having ABD pain later that evening associated with feeling light headed, no relief with Gas-X or Tums, some relief with cough syrup. Pt denies bloody or dark stool. Denies abd pain today.

## 2017-05-29 NOTE — ED Notes (Signed)
Unable to provide urine specimen at this time.  

## 2017-05-29 NOTE — ED Notes (Signed)
Report called to Judeth Cornfield, RN on [address] - ready to accept pt

## 2017-05-30 ENCOUNTER — Encounter (HOSPITAL_COMMUNITY): Payer: Self-pay

## 2017-05-30 DIAGNOSIS — Z888 Allergy status to other drugs, medicaments and biological substances status: Secondary | ICD-10-CM | POA: Diagnosis not present

## 2017-05-30 DIAGNOSIS — F1010 Alcohol abuse, uncomplicated: Secondary | ICD-10-CM | POA: Diagnosis not present

## 2017-05-30 DIAGNOSIS — I110 Hypertensive heart disease with heart failure: Secondary | ICD-10-CM | POA: Diagnosis not present

## 2017-05-30 DIAGNOSIS — I428 Other cardiomyopathies: Secondary | ICD-10-CM | POA: Diagnosis not present

## 2017-05-30 DIAGNOSIS — Z91013 Allergy to seafood: Secondary | ICD-10-CM | POA: Diagnosis not present

## 2017-05-30 DIAGNOSIS — I5042 Chronic combined systolic (congestive) and diastolic (congestive) heart failure: Secondary | ICD-10-CM

## 2017-05-30 DIAGNOSIS — Z880 Allergy status to penicillin: Secondary | ICD-10-CM | POA: Diagnosis not present

## 2017-05-30 DIAGNOSIS — G4733 Obstructive sleep apnea (adult) (pediatric): Secondary | ICD-10-CM | POA: Diagnosis not present

## 2017-05-30 DIAGNOSIS — I951 Orthostatic hypotension: Secondary | ICD-10-CM | POA: Diagnosis not present

## 2017-05-30 DIAGNOSIS — Z79899 Other long term (current) drug therapy: Secondary | ICD-10-CM | POA: Diagnosis not present

## 2017-05-30 DIAGNOSIS — R740 Nonspecific elevation of levels of transaminase and lactic acid dehydrogenase [LDH]: Secondary | ICD-10-CM | POA: Diagnosis not present

## 2017-05-30 DIAGNOSIS — N179 Acute kidney failure, unspecified: Secondary | ICD-10-CM | POA: Diagnosis not present

## 2017-05-30 LAB — BASIC METABOLIC PANEL
Anion gap: 11 (ref 5–15)
BUN: 16 mg/dL (ref 6–20)
CALCIUM: 9 mg/dL (ref 8.9–10.3)
CO2: 27 mmol/L (ref 22–32)
CREATININE: 1.32 mg/dL — AB (ref 0.61–1.24)
Chloride: 100 mmol/L — ABNORMAL LOW (ref 101–111)
GFR, EST NON AFRICAN AMERICAN: 57 mL/min — AB (ref >60)
Glucose, Bld: 129 mg/dL — ABNORMAL HIGH (ref 65–99)
Potassium: 3.5 mmol/L (ref 3.5–5.1)
SODIUM: 138 mmol/L (ref 135–145)

## 2017-05-30 LAB — TROPONIN I: TROPONIN I: 0.13 ng/mL — AB (ref <0.03)

## 2017-05-30 MED ORDER — ENOXAPARIN SODIUM 40 MG/0.4ML ~~LOC~~ SOLN
40.0000 mg | SUBCUTANEOUS | Status: DC
  Filled 2017-05-30: qty 0.4

## 2017-05-30 NOTE — Discharge Instructions (Signed)
It was a pleasure to take care of you Ethan Wilcox. Your symptoms of dizziness and lightheadedness were likely due to dehydration. You were given fluids and monitored during your hospitalization. Please drink plenty of water and refrain from using miralax or magnesium citrate for the next few days to avoid further dehydration. Thank you!   Dehydration, Adult Dehydration is when there is not enough fluid or water in your body. This happens when you lose more fluids than you take in. Dehydration can range from mild to very bad. It should be treated right away to keep it from getting very bad. Symptoms of mild dehydration may include:  Thirst.  Dry lips.  Slightly dry mouth.  Dry, warm skin.  Dizziness. Symptoms of moderate dehydration may include:  Very dry mouth.  Muscle cramps.  Dark pee (urine). Pee may be the color of tea.  Your body making less pee.  Your eyes making fewer tears.  Heartbeat that is uneven or faster than normal (palpitations).  Headache.  Light-headedness, especially when you stand up from sitting.  Fainting (syncope). Symptoms of very bad dehydration may include:  Changes in skin, such as: ? Cold and clammy skin. ? Blotchy (mottled) or pale skin. ? Skin that does not quickly return to normal after being lightly pinched and let go (poor skin turgor).  Changes in body fluids, such as: ? Feeling very thirsty. ? Your eyes making fewer tears. ? Not sweating when body temperature is high, such as in hot weather. ? Your body making very little pee.  Changes in vital signs, such as: ? Weak pulse. ? Pulse that is more than 100 beats a minute when you are sitting still. ? Fast breathing. ? Low blood pressure.  Other changes, such as: ? Sunken eyes. ? Cold hands and feet. ? Confusion. ? Lack of energy (lethargy). ? Trouble waking up from sleep. ? Short-term weight loss. ? Unconsciousness. Follow these instructions at home:  If told by your doctor,  drink an ORS: ? Make an ORS by using instructions on the package. ? Start by drinking small amounts, about  cup (120 mL) every 5-10 minutes. ? Slowly drink more until you have had the amount that your doctor said to have.  Drink enough clear fluid to keep your pee clear or pale yellow. If you were told to drink an ORS, finish the ORS first, then start slowly drinking clear fluids. Drink fluids such as: ? Water. Do not drink only water by itself. Doing that can make the salt (sodium) level in your body get too low (hyponatremia). ? Ice chips. ? Fruit juice that you have added water to (diluted). ? Low-calorie sports drinks.  Avoid: ? Alcohol. ? Drinks that have a lot of sugar. These include high-calorie sports drinks, fruit juice that does not have water added, and soda. ? Caffeine. ? Foods that are greasy or have a lot of fat or sugar.  Take over-the-counter and prescription medicines only as told by your doctor.  Do not take salt tablets. Doing that can make the salt level in your body get too high (hypernatremia).  Eat foods that have minerals (electrolytes). Examples include bananas, oranges, potatoes, tomatoes, and spinach.  Keep all follow-up visits as told by your doctor. This is important. Contact a doctor if:  You have belly (abdominal) pain that: ? Gets worse. ? Stays in one area (localizes).  You have a rash.  You have a stiff neck.  You get angry or annoyed more  easily than normal (irritability).  You are more sleepy than normal.  You have a harder time waking up than normal.  You feel: ? Weak. ? Dizzy. ? Very thirsty.  You have peed (urinated) only a small amount of very dark pee during 6-8 hours. Get help right away if:  You have symptoms of very bad dehydration.  You cannot drink fluids without throwing up (vomiting).  Your symptoms get worse with treatment.  You have a fever.  You have a very bad headache.  You are throwing up or having watery  poop (diarrhea) and it: ? Gets worse. ? Does not go away.  You have blood or something green (bile) in your throw-up.  You have blood in your poop (stool). This may cause poop to look black and tarry.  You have not peed in 6-8 hours.  You pass out (faint).  Your heart rate when you are sitting still is more than 100 beats a minute.  You have trouble breathing. This information is not intended to replace advice given to you by your health care provider. Make sure you discuss any questions you have with your health care provider. Document Released: 10/22/2008 Document Revised: 07/16/2015 Document Reviewed: 02/19/2015 Elsevier Interactive Patient Education  2018 [employer].

## 2017-05-30 NOTE — Progress Notes (Addendum)
   Subjective: Mr. Ulep was seen sitting up in chair by his bedside this morning. He states that he has not had any dizziness/lightheadedness today. He denies nausea/vomiting.   Objective:  Vital signs in last 24 hours: Vitals:   05/29/17 1445 05/29/17 1500 05/29/17 1625 05/30/17 0552  BP: 121/88 104/89 (!) 106/55 (!) 102/52  Pulse: (!) 46 (!) 59  74  Resp: 20 14 14 18   Temp:   98.4 F (36.9 C) 98.1 F (36.7 C)  TempSrc:   Oral Oral  SpO2: 95% 97% 97% 98%  Weight:   260 lb (117.9 kg) 260 lb 9.6 oz (118.2 kg)  Height:   5\' 7"  (1.702 m)    Physical Exam  Constitutional: He appears well-developed and well-nourished. No distress.  HENT:  Head: Normocephalic and atraumatic.  Eyes: Conjunctivae are normal.  Cardiovascular: Normal rate, regular rhythm, normal heart sounds and intact distal pulses.  Respiratory: Breath sounds normal. No respiratory distress. He has no wheezes.  GI: Soft. Bowel sounds are normal. He exhibits no distension. There is no tenderness.  Musculoskeletal: He exhibits no edema.  Neurological: He is alert.  Skin: He is not diaphoretic. No erythema.  No skin tenting  Psychiatric: He has a normal mood and affect. His behavior is normal. Judgment and thought content normal.   Assessment/Plan:  Mr. Bartold is a 62 y.o m with nonischemic cardiomyopathy, HFrEF (25-30%), OSA, essential hypertension, and polysubstance abuse who presented post feeling lightheaded and dizzy when he was standing up prior to getting his outpatient cardiac MRI done.  Orthostatic Syncope  The patient states that does not have any dizziness/lightheadedness today 5/22. The patient's symptoms are likely due to decreased oral intake and recent bowel prep that caused him to lose a lot of fluids. The patient is   -Cardiac monitoring noted few pvcs this morning at 9am -PT/OT evaluation pending -Patient's troponin 0.09>0.13>0.13 stopped trending   Acute Kidney Injury The patient's creatinine  is 2.31 on admission which decreased today to 1.32. The patient's baseline creatinine has been 0.9-1.2.  -Resolved  HFrEF LVEF 25-30%, no regional wall motion abnormalities, G1DD,  moderate concentric hypertrophy seen in left ventricle per echo in August 2018.   Patient is on carvedilol 25mg  bid, lasix 60mg  in am and 40mg  in pm, losartan 100mg  qd, spironolactone 25mg  qd at home.   -Will resume patient's diuretics on discharge  Essential Hypertension  Patient's blood pressure has been normotensive since admission has ranged 86-121/57-88. He is on carvedilol 25mg  bid, and losartan 100mg  qd at home.   -Resume blood pressure medication on discharge  Polysubstance abuse The patient has not exhibited any signs of alcohol withdrawal. Patient's CIWA has been 0.   Dispo: Anticipated discharge in approximately today pending PT eval.  Lorenso Courier, MD 05/30/2017, 10:58 AM Pager: [phone]

## 2017-05-30 NOTE — Evaluation (Signed)
Physical Therapy Evaluation Patient Details Name: Ethan Wilcox MRN: [id number] DOB: 01/07/1956 Today's Date: 05/30/2017   History of Present Illness  Pt adm with syncope due to orthostatic hypotension due to dehydration after colonoscopy. PMH - chf htn, polysubstance abuse  Clinical Impression  Pt doing well with mobility and no further PT needed.  Ready for dc from PT standpoint.      Follow Up Recommendations No PT follow up    Equipment Recommendations  None recommended by PT    Recommendations for Other Services       Precautions / Restrictions Precautions Precautions: None Restrictions Weight Bearing Restrictions: No      Mobility  Bed Mobility               General bed mobility comments: Pt sitting EOB  Transfers Overall transfer level: Independent                  Ambulation/Gait Ambulation/Gait assistance: Independent Ambulation Distance (Feet): 475 Feet Assistive device: None Gait Pattern/deviations: WFL(Within Functional Limits) Gait velocity: normal Gait velocity interpretation: >4.37 ft/sec, indicative of normal walking speed General Gait Details: Steady gait  Stairs            Wheelchair Mobility    Modified Rankin (Stroke Patients Only)       Balance Overall balance assessment: No apparent balance deficits (not formally assessed)                                           Pertinent Vitals/Pain Pain Assessment: No/denies pain    Home Living Family/patient expects to be discharged to:: Private residence Living Arrangements: Children Available Help at Discharge: Family   Home Access: Stairs to enter Entrance Stairs-Rails: Right Entrance Stairs-Number of Steps: flight Home Layout: One level        Prior Function Level of Independence: Independent               Hand Dominance        Extremity/Trunk Assessment   Upper Extremity Assessment Upper Extremity Assessment: Overall WFL for  tasks assessed    Lower Extremity Assessment Lower Extremity Assessment: Overall WFL for tasks assessed       Communication   Communication: No difficulties  Cognition Arousal/Alertness: Awake/alert Behavior During Therapy: WFL for tasks assessed/performed Overall Cognitive Status: Within Functional Limits for tasks assessed                                        General Comments      Exercises     Assessment/Plan    PT Assessment Patent does not need any further PT services  PT Problem List         PT Treatment Interventions      PT Goals (Current goals can be found in the Care Plan section)  Acute Rehab PT Goals PT Goal Formulation: All assessment and education complete, DC therapy    Frequency     Barriers to discharge        Co-evaluation               AM-PAC PT "6 Clicks" Daily Activity  Outcome Measure Difficulty turning over in bed (including adjusting bedclothes, sheets and blankets)?: None Difficulty moving from lying on back to sitting on the side  of the bed? : None Difficulty sitting down on and standing up from a chair with arms (e.g., wheelchair, bedside commode, etc,.)?: None Help needed moving to and from a bed to chair (including a wheelchair)?: None Help needed walking in hospital room?: None Help needed climbing 3-5 steps with a railing? : None 6 Click Score: 24    End of Session   Activity Tolerance: Patient tolerated treatment well Patient left: in bed Nurse Communication: Mobility status PT Visit Diagnosis: Other abnormalities of gait and mobility (R26.89)    Time: 5409-8119 PT Time Calculation (min) (ACUTE ONLY): 6 min   Charges:   PT Evaluation $PT Eval Low Complexity: 1 Low     PT G CodesMarland Kitchen        [facility] PT [phone]   Angelina Ok [facility] 05/30/2017, 12:50 PM

## 2017-05-30 NOTE — Discharge Summary (Addendum)
Name: Ethan Wilcox MRN: [id number] DOB: 08-Jan-1956 62 y.o. PCP: Eulah Pont, MD  Date of Admission: 05/29/2017 11:23 AM Date of Discharge: 05/30/2017 Attending Physician: Dr. Carlynn Purl  Discharge Diagnosis:  Active Problems: Orthostatic Syncope Chronic combined systolic and diastolic congestive heart failure  Discharge Medications: Allergies as of 05/30/2017      Reactions   Lisinopril Anaphylaxis   Possible cause of anaphylaxis episode on 08/24/2012 which required admission.   Shrimp [shellfish Allergy] Anaphylaxis   Bidil [isosorb Dinitrate-hydralazine]    headaches   Penicillins Other (See Comments)   unknowN      Medication List    TAKE these medications   acetaminophen 500 MG tablet Commonly known as:  TYLENOL Take 1,000 mg by mouth as needed for mild pain.   albuterol 108 (90 Base) MCG/ACT inhaler Commonly known as:  PROVENTIL HFA;VENTOLIN HFA Inhale 2 puffs into the lungs every 4 (four) hours as needed for wheezing or shortness of breath.   ASPIR-LOW 81 MG EC tablet Generic drug:  aspirin Take 81 mg by mouth daily.   buPROPion 75 MG tablet Commonly known as:  WELLBUTRIN Take 1 tablet (75 mg total) by mouth 2 (two) times daily.   carvedilol 25 MG tablet Commonly known as:  COREG TAKE 1 TABLET BY MOUTH TWICE A DAY   COUGH/SORE THROAT LOZENGES MT Use as directed 1-3 drops in the mouth or throat daily as needed (cough).   diphenhydrAMINE 25 MG tablet Commonly known as:  BENADRYL Take 25 mg by mouth every 6 (six) hours as needed for allergies. Reported on 05/24/2015   fenofibrate 145 MG tablet Commonly known as:  TRICOR Take 1 tablet (145 mg total) by mouth daily.   furosemide 40 MG tablet Commonly known as:  LASIX Take 60 mg (1.5 tabs) in AM, then 40 mg (1 tab) in  PM   guaifenesin 100 MG/5ML syrup Commonly known as:  ROBITUSSIN Take 200 mg by mouth 3 (three) times daily as needed for cough.   losartan 100 MG tablet Commonly known as:   COZAAR Take 1 tablet (100 mg total) by mouth daily.   multivitamin with minerals Tabs tablet Take 1 tablet by mouth daily.   omeprazole 40 MG capsule Commonly known as:  PRILOSEC TAKE 1 CAPSULE BY MOUTH DAILY   polyethylene glycol powder powder Commonly known as:  GLYCOLAX/MIRALAX Take 17 g of powder with 4-8 ounces of fluid daily for constipation.   simethicone 125 MG chewable tablet Commonly known as:  MYLICON Chew 125 mg by mouth as needed for flatulence.   spironolactone 25 MG tablet Commonly known as:  ALDACTONE Take 1 tablet (25 mg total) by mouth daily.       Disposition and follow-up:   Ethan Wilcox was discharged from [facility] in stable condition.  At the hospital follow up visit please address:  1.  Orthostatic Syncope: please ensure that the patient is well hydrated and that he does not have any weakness or dizziness. Please make sure that the patient's cardiac mri is rescheduled as he was not able to get it due to syncopal episode.  PVCs: Patient had pvcs during this hospitalization and on chart review appears to have had frequent pvcs during prior hospitalizations as well.   2.  Labs / imaging needed at time of follow-up: Cardiac MRI  3.  Pending labs/ test needing follow-up: none  Follow-up Appointments: Follow-up Information    Eulah Pont, MD. Schedule an appointment as soon as  possible for a visit in 1 week(s).   Specialty:  Internal Medicine Contact information: [address] [place] [address] [phone]           Hospital Course by problem list:   Syncope Mr. Mikulak presented after a syncopal episode that occurred when he was getting an outpatient MRI. He stated that he felt lightheaded and dizzy for a few minutes when getting up from wheelchair to get to MRI machine. The patient's syncopal episode was thought to be orthostatic in nature secondary to decreased oral intake in setting of bowel prep for colonoscopy done  4 days earlier and subsequent abdominal pain after colonoscopy. The patient has chronic combined heart failure (EF 25-30% and g1dd on echo 08/2017) which likely contributed to the patient having orthostatic syncope in a volume down state.  The patient had elevated lactic acid of 3.53 that decreased to 2.70 and he had increased troponin of 0.09>0.13>0.13. Per chart review the patient has a chronically elevated troponin.     The patient was managed for his syncope with gentle fluids with close monitoring as the patient also has a diagnosis of HFrEF. His diuretics and blood pressure medications were also held. The patient was placed on cardiac monitoring and noted to have pvcs. He did not have any cardiac symptoms and on chart review it appears that he has had frequent pvcs on prior hospitalizations as well.   The patient was stable on the day of discharge and was told to follow up in clinic within a week.   Discharge Vitals:   BP (!) 102/52 (BP Location: Right Arm)   Pulse 74   Temp 98.1 F (36.7 C) (Oral)   Resp 18   Ht 5\' 7"  (1.702 m)   Wt 260 lb 9.6 oz (118.2 kg)   SpO2 98%   BMI 40.82 kg/m   Pertinent Labs, Studies, and Procedures:  BMP Latest Ref Rng & Units 05/30/2017 05/29/2017 05/11/2017  Glucose 65 - 99 mg/dL 751(Z) 001(V) 494(W)  BUN 6 - 20 mg/dL 16 17 13   Creatinine 0.61 - 1.24 mg/dL 9.67(R) 9.16(B) 8.46  BUN/Creat Ratio 9 - 20 - - -  Sodium 135 - 145 mmol/L 138 138 140  Potassium 3.5 - 5.1 mmol/L 3.5 4.0 4.6  Chloride 101 - 111 mmol/L 100(L) 97(L) 100(L)  CO2 22 - 32 mmol/L 27 26 26   Calcium 8.9 - 10.3 mg/dL 9.0 9.3 9.6   CBC Latest Ref Rng & Units 05/29/2017 05/11/2017 03/26/2017  WBC 4.0 - 10.5 K/uL 9.6 7.2 8.3  Hemoglobin 13.0 - 17.0 g/dL 65.9 93.5 70.1  Hematocrit 39.0 - 52.0 % 44.9 47.4 47.3  Platelets 150 - 400 K/uL 256 309 302   CMP     Component Value Date/Time   NA 138 05/30/2017 0428   NA 136 05/01/2016 1635   K 3.5 05/30/2017 0428   CL 100 (L) 05/30/2017 0428    CO2 27 05/30/2017 0428   GLUCOSE 129 (H) 05/30/2017 0428   BUN 16 05/30/2017 0428   BUN 16 05/01/2016 1635   CREATININE 1.32 (H) 05/30/2017 0428   CREATININE 0.92 07/21/2014 1123   CALCIUM 9.0 05/30/2017 0428   PROT 7.0 05/29/2017 1156   ALBUMIN 3.4 (L) 05/29/2017 1156   AST 33 05/29/2017 1156   ALT 31 05/29/2017 1156   ALKPHOS 69 05/29/2017 1156   BILITOT 1.1 05/29/2017 1156   GFRNONAA 57 (L) 05/30/2017 0428   GFRNONAA >89 07/21/2014 1123   GFRAA >60 05/30/2017 7793  GFRAA >89 07/21/2014 1123   Troponin (Point of Care Test) 0.13>0.13  Discharge Instructions: Discharge Instructions    Call MD for:  extreme fatigue   Complete by:  As directed    Call MD for:  persistant dizziness or light-headedness   Complete by:  As directed    Call MD for:  persistant nausea and vomiting   Complete by:  As directed    Call MD for:  temperature >100.4   Complete by:  As directed    Diet - low sodium heart healthy   Complete by:  As directed    Discharge instructions   Complete by:  As directed    It was a pleasure to take care of you Mr. Kerins. Your symptoms of dizziness and lightheadedness were likely due to dehydration. You were given fluids and monitored during your hospitalization. Please drink plenty of water and refrain from using miralax or magnesium citrate for the next few days to avoid further dehydration. Thank you!   Increase activity slowly   Complete by:  As directed       Signed: Lorenso Courier, MD 06/01/2017, 3:13 PM   Pager: [phone]

## 2017-05-31 MED FILL — SPIRONOLACTONE 25 MG TABLET: 25 | 90 days supply | Qty: 90 | Fill #1

## 2017-05-31 MED FILL — FUROSEMIDE 40 MG TAB: 40 | 30 days supply | Qty: 75 | Fill #3

## 2017-06-07 ENCOUNTER — Telehealth: Payer: Self-pay

## 2017-06-07 NOTE — Telephone Encounter (Signed)
Transition Care Management Follow-up Telephone Call    Date discharged? 05/30/2017   How have you been since you were released from the hospital? He felt he had improved, but patient noted that he does still have some lightheadedness when he goes outside. When this occurs he sits down and drinks some water as well.  Patient was asked if he would be interested in smoking cessation because his insurance would help cover products. Patient was interested, but didn't seem to want to talk further on the phone. Could follow up at his next clinic appointment about available options.  Functional Questionnaire:    Any patient concerns? Patient stated no concerns at this time and was instructed to contact the clinic if he had other concerns before his next clinic appointment.   Confirmed importance and date/time of follow-up visits scheduled yes, confirmed appointment for 06/12/2017.

## 2017-06-08 NOTE — Telephone Encounter (Signed)
Patient was seen in clinic with Taylor Sprague, PharmD candidate. I agree with the assessment and plan of care documented.  

## 2017-06-12 ENCOUNTER — Ambulatory Visit: Payer: Medicaid Other

## 2017-06-19 ENCOUNTER — Other Ambulatory Visit: Payer: Self-pay

## 2017-06-19 ENCOUNTER — Ambulatory Visit: Payer: Medicaid Other | Admitting: Internal Medicine

## 2017-06-19 ENCOUNTER — Encounter (INDEPENDENT_AMBULATORY_CARE_PROVIDER_SITE_OTHER): Payer: Self-pay

## 2017-06-19 ENCOUNTER — Encounter: Payer: Self-pay | Admitting: Internal Medicine

## 2017-06-19 VITALS — BP 150/100 | HR 56 | Temp 97.9°F | Wt 262.8 lb

## 2017-06-19 DIAGNOSIS — R21 Rash and other nonspecific skin eruption: Secondary | ICD-10-CM

## 2017-06-19 DIAGNOSIS — Z6841 Body Mass Index (BMI) 40.0 and over, adult: Secondary | ICD-10-CM | POA: Diagnosis not present

## 2017-06-19 DIAGNOSIS — I110 Hypertensive heart disease with heart failure: Secondary | ICD-10-CM

## 2017-06-19 DIAGNOSIS — E669 Obesity, unspecified: Secondary | ICD-10-CM

## 2017-06-19 DIAGNOSIS — B1920 Unspecified viral hepatitis C without hepatic coma: Secondary | ICD-10-CM | POA: Diagnosis not present

## 2017-06-19 DIAGNOSIS — I5042 Chronic combined systolic (congestive) and diastolic (congestive) heart failure: Secondary | ICD-10-CM | POA: Diagnosis not present

## 2017-06-19 DIAGNOSIS — I951 Orthostatic hypotension: Secondary | ICD-10-CM | POA: Diagnosis present

## 2017-06-19 DIAGNOSIS — G4733 Obstructive sleep apnea (adult) (pediatric): Secondary | ICD-10-CM

## 2017-06-19 MED ORDER — FUROSEMIDE 40 MG PO TABS: 40.0000 mg | ORAL_TABLET | Freq: Two times a day (BID) | ORAL | 1 refills | Status: DC

## 2017-06-19 NOTE — Progress Notes (Signed)
CC: orthostatic syncope  HPI:  [name] is a 61 y.o. male with PMH as listed below including NICM (EF 25-30%), OSA, HTN, Obesity, Hepatitis C, and polysubstance use who presents for a hospital follow up visit after orthostatic syncope.  Orthostasis Patient presents for hospital follow-up after admission from 5/21 to 5/22 for orthostasis when he was about to have his cardiac MRI.  This was thought secondary to dehydration after bowel prep for colonoscopy. He was treated with gentle fluids during admission and his cardiac medications were held.  He was noted to have frequent PVCs on telemetry.  His symptoms improved and all of his home medications were resumed on discharge.  Patient states that he did well since discharge.  On arrival to clinic he did experience lightheadedness when standing up quickly in the waiting room which resolved after sitting back down.  He reports adherence to his medications which include Lasix 60 mg a.m. 40 p.m., Coreg 25 mg twice daily, losartan 100 mg daily, spironolactone 25 mg daily.  He says he drinks up to 3 L of fluid per day which include water, sodas, and a half a pint of vodka daily.  He denies any room spinning sensation, chest pain, dyspnea, loss of consciousness, or leg swelling.  He has OSA but is not using his CPAP at night.  Initial blood pressure on arrival today was 150/100 with pulse 56.  Orthostatic vitals were: Lying down 106/69 pulse 109 Sitting 99/75 pulse 60 Standing 153/116 pulse 70  A/P: Patient with orthostasis which is likely multifactorial in the setting of medications, PVCs, and alcohol use.  We will reduce his morning Lasix dose and continue his other medications as prescribed.  He is advised to monitor his fluid intake and reducing his soda and alcohol use.  He is advised to take his time when changing positions. -Decrease Lasix to 40 mg twice a day -Continue Coreg 25 mg twice daily, losartan 100 mg daily, spironolactone 25 mg  daily  Chronic combined systolic and diastolic congestive heart failure (HCC) Patient has nonischemic cardiomyopathy with EF 25-30%.  He was recently admitted for orthostasis which improved with gentle IV fluids.  He has been doing well since discharge although has had occasional postural lightheadedness including this morning when walking into clinic.  He has had about a 35 pound weight loss in the last year. A/P: On exam patient appears euvolemic without peripheral edema or rales on auscultation of lungs.  Given his postural lightheadedness and orthostasis I will reduce his morning Lasix from 60 to 40 mg and continue his other medications as prescribed. -Decrease Lasix to 40 mg a.m. 40 mg p.m. -Continue Coreg 25 mg twice daily, losartan 100 mg daily, spironolactone 25 mg daily -He is advised to follow-up with cardiology as scheduled with [place] again in about 4 weeks for reassessment; he is also advised to reschedule his cardiac MRI  Rash of face Patient reports dark in skin coloration on the lateral aspects of his face which is causing irritation. A/P: He has well demarcated darkened patches of skin on both sides of his face lateral to his eyes from the temples to upper cheeks.  Will refer to dermatology for further evaluation.    Past Medical History:  Diagnosis Date  . Active smoker /   1/2 ppd  . Chronic combined systolic and diastolic CHF (congestive heart failure) (HCC)   . Cocaine abuse (HCC)   . ETOH abuse   . GERD (gastroesophageal reflux disease)   .  Gout   . HCV (hepatitis C virus)   . Hx of anaphylaxis with angioedema 08/23/2012   Admitted with angioedema on 08/24/2012 No intubation Unknown allergen Shrimp Vs Spices Vs ACEis Stopped Lisinopril   . Hypertension   . Morbid obesity (HCC)   . Nonischemic cardiomyopathy (HCC)    admitted 5/11 w acute systolic CHF. LHC showed no angiographic CAD. Echo (5/11) showed EF 15-20% w diffuse sever hypokinesis, moderate MR> RHC showed PCWP  10, CI 1.47 (Fick.) cause of CMP thought to be heavy ETOH and cocaine. No ICD due to ongoing ETOH intake  . Sleep apnea    Review of Systems:   Review of Systems  Respiratory: Negative for shortness of breath.   Cardiovascular: Negative for chest pain, palpitations and leg swelling.  Musculoskeletal: Negative for falls.  Neurological: Negative for loss of consciousness.       Occasional lightheadedness  Psychiatric/Behavioral:       Reports daily Alcohol use     Physical Exam:  Vitals:   06/19/17 1100  BP: (!) 150/100  Pulse: (!) 56  Temp: 97.9 F (36.6 C)  TempSrc: Oral  SpO2: 92%   Physical Exam  Constitutional: He is oriented to person, place, and time. He appears well-developed and well-nourished. No distress.  Obese male  HENT:  Head: Normocephalic and atraumatic.  Cardiovascular: Normal rate and regular rhythm.  Pulmonary/Chest: Effort normal. No respiratory distress. He has no wheezes. He has no rales.  Abdominal:  Obese abdomen  Neurological: He is alert and oriented to person, place, and time.  Skin:  Darkened well-demarcated macules on the face from the temples to zygomatic arch bilaterally     Assessment & Plan:   See Encounters Tab for problem based charting.  Patient discussed with Dr. Sandre Kitty

## 2017-06-19 NOTE — Assessment & Plan Note (Signed)
Patient has nonischemic cardiomyopathy with EF 25-30%.  He was recently admitted for orthostasis which improved with gentle IV fluids.  He has been doing well since discharge although has had occasional postural lightheadedness including this morning when walking into clinic.  He has had about a 35 pound weight loss in the last year. A/P: On exam patient appears euvolemic without peripheral edema or rales on auscultation of lungs.  Given his postural lightheadedness and orthostasis I will reduce his morning Lasix from 60 to 40 mg and continue his other medications as prescribed. -Decrease Lasix to 40 mg a.m. 40 mg p.m. -Continue Coreg 25 mg twice daily, losartan 100 mg daily, spironolactone 25 mg daily -He is advised to follow-up with cardiology as scheduled with [place] again in about 4 weeks for reassessment; he is also advised to reschedule his cardiac MRI

## 2017-06-19 NOTE — Assessment & Plan Note (Addendum)
Patient presents for hospital follow-up after admission from 5/21 to 5/22 for orthostasis when he was about to have his cardiac MRI.  This was thought secondary to dehydration after bowel prep for colonoscopy. He was treated with gentle fluids during admission and his cardiac medications were held.  He was noted to have frequent PVCs on telemetry.  His symptoms improved and all of his home medications were resumed on discharge.  Patient states that he did well since discharge.  On arrival to clinic he did experience lightheadedness when standing up quickly in the waiting room which resolved after sitting back down.  He reports adherence to his medications which include Lasix 60 mg a.m. 40 p.m., Coreg 25 mg twice daily, losartan 100 mg daily, spironolactone 25 mg daily.  He says he drinks up to 3 L of fluid per day which include water, sodas, and a half a pint of vodka daily.  He denies any room spinning sensation, chest pain, dyspnea, loss of consciousness, or leg swelling.  He has OSA but is not using his CPAP at night.  Initial blood pressure on arrival today was 150/100 with pulse 56.  Orthostatic vitals were: Lying down 106/69 pulse 109 Sitting 99/75 pulse 60 Standing 153/116 pulse 70  A/P: Patient with orthostasis which is likely multifactorial in the setting of medications, PVCs, and alcohol use.  We will reduce his morning Lasix dose and continue his other medications as prescribed.  He is advised to monitor his fluid intake and reducing his soda and alcohol use.  He is advised to take his time when changing positions. -Decrease Lasix to 40 mg twice a day -Continue Coreg 25 mg twice daily, losartan 100 mg daily, spironolactone 25 mg daily

## 2017-06-19 NOTE — Patient Instructions (Addendum)
It was a pleasure to see you Ethan Wilcox.  Please take your Lasix 40 mg twice a day (1 tab twice a day).  Please limit fluids to 2 liters per day. Try to cut back on sodas and alcohol.  Please call to reschedule your cardiac MRI.  Please call to get new equipment for your CPAP.  Please take your time when changing positions to avoid dizziness and falls.  I am referring you to a dermatologist (skin doctor).  Please follow up with [place] in about 4 weeks or sooner if needed.

## 2017-06-19 NOTE — Assessment & Plan Note (Signed)
Patient reports dark in skin coloration on the lateral aspects of his face which is causing irritation. A/P: He has well demarcated darkened patches of skin on both sides of his face lateral to his eyes from the temples to upper cheeks.  Will refer to dermatology for further evaluation.

## 2017-06-21 NOTE — Progress Notes (Signed)
Internal Medicine Clinic Attending  Case discussed with Dr. Boswell  at the time of the visit.  We reviewed the resident's history and exam and pertinent patient test results.  I agree with the assessment, diagnosis, and plan of care documented in the resident's note.  Alexander N Raines, MD   

## 2017-07-05 ENCOUNTER — Other Ambulatory Visit (HOSPITAL_COMMUNITY): Payer: Self-pay | Admitting: Cardiology

## 2017-07-05 DIAGNOSIS — I5022 Chronic systolic (congestive) heart failure: Secondary | ICD-10-CM

## 2017-07-09 MED FILL — FUROSEMIDE 40 MG TAB: 40 | 30 days supply | Qty: 60 | Fill #0

## 2017-07-16 MED FILL — LOSARTAN POTASSIUM 100 MG T: 100 | 30 days supply | Qty: 30 | Fill #0

## 2017-07-20 ENCOUNTER — Ambulatory Visit: Payer: Medicaid Other

## 2017-08-03 MED FILL — FUROSEMIDE 40 MG TAB: 40 | 30 days supply | Qty: 60 | Fill #1

## 2017-08-06 ENCOUNTER — Other Ambulatory Visit: Payer: Self-pay | Admitting: Internal Medicine

## 2017-08-06 DIAGNOSIS — K219 Gastro-esophageal reflux disease without esophagitis: Secondary | ICD-10-CM

## 2017-08-06 DIAGNOSIS — I5042 Chronic combined systolic (congestive) and diastolic (congestive) heart failure: Secondary | ICD-10-CM

## 2017-08-06 MED ORDER — FUROSEMIDE 40 MG PO TABS: 40.0000 mg | ORAL_TABLET | Freq: Two times a day (BID) | ORAL | 6 refills | Status: DC

## 2017-08-06 MED ORDER — OMEPRAZOLE 40 MG PO CPDR: 40.0000 mg | DELAYED_RELEASE_CAPSULE | Freq: Every day | ORAL | 6 refills | Status: DC

## 2017-08-06 MED ORDER — CARVEDILOL 25 MG PO TABS: 25.0000 mg | ORAL_TABLET | Freq: Two times a day (BID) | ORAL | 6 refills | Status: DC

## 2017-08-06 MED ORDER — FUROSEMIDE 40 MG PO TABS: 40.0000 mg | ORAL_TABLET | Freq: Two times a day (BID) | ORAL | 6 refills | Status: AC

## 2017-08-06 NOTE — Addendum Note (Signed)
Addended by: Neomia Dear on: 08/06/2017 08:12 AM   Modules accepted: Orders

## 2017-08-06 NOTE — Telephone Encounter (Signed)
carvedilol (COREG) 25 MG tablet,  furosemide (LASIX) 40 MG tablet,  omeprazole (PRILOSEC) 40 MG capsule, refill  Request @   [facility] [place] - [place], [place] - [address].

## 2017-08-07 MED FILL — CARVEDILOL 25 MG TABLET: 25 | 30 days supply | Qty: 60 | Fill #0

## 2017-08-07 MED FILL — OMEPRAZOLE 40 MG CPDR: 40 | 30 days supply | Qty: 30 | Fill #0

## 2017-08-14 ENCOUNTER — Encounter: Payer: Self-pay | Admitting: Internal Medicine

## 2017-08-14 ENCOUNTER — Encounter (INDEPENDENT_AMBULATORY_CARE_PROVIDER_SITE_OTHER): Payer: Self-pay

## 2017-08-14 ENCOUNTER — Ambulatory Visit: Payer: Medicaid Other | Admitting: Internal Medicine

## 2017-08-14 ENCOUNTER — Other Ambulatory Visit: Payer: Self-pay

## 2017-08-14 VITALS — BP 102/66 | HR 48 | Temp 98.1°F | Ht 67.0 in | Wt 258.1 lb

## 2017-08-14 DIAGNOSIS — R1013 Epigastric pain: Secondary | ICD-10-CM

## 2017-08-14 DIAGNOSIS — K219 Gastro-esophageal reflux disease without esophagitis: Secondary | ICD-10-CM | POA: Diagnosis not present

## 2017-08-14 DIAGNOSIS — I10 Essential (primary) hypertension: Secondary | ICD-10-CM

## 2017-08-14 DIAGNOSIS — F1990 Other psychoactive substance use, unspecified, uncomplicated: Secondary | ICD-10-CM | POA: Diagnosis not present

## 2017-08-14 DIAGNOSIS — I428 Other cardiomyopathies: Secondary | ICD-10-CM | POA: Diagnosis not present

## 2017-08-14 DIAGNOSIS — B1920 Unspecified viral hepatitis C without hepatic coma: Secondary | ICD-10-CM | POA: Diagnosis not present

## 2017-08-14 DIAGNOSIS — I110 Hypertensive heart disease with heart failure: Secondary | ICD-10-CM | POA: Diagnosis not present

## 2017-08-14 DIAGNOSIS — Z79899 Other long term (current) drug therapy: Secondary | ICD-10-CM

## 2017-08-14 DIAGNOSIS — I5042 Chronic combined systolic (congestive) and diastolic (congestive) heart failure: Secondary | ICD-10-CM

## 2017-08-14 DIAGNOSIS — F329 Major depressive disorder, single episode, unspecified: Secondary | ICD-10-CM | POA: Diagnosis not present

## 2017-08-14 DIAGNOSIS — G4733 Obstructive sleep apnea (adult) (pediatric): Secondary | ICD-10-CM

## 2017-08-14 NOTE — Patient Instructions (Addendum)
Thank you for coming to the clinic today. It was a pleasure to see you.   For your heartburn, start taking omeprazole 40 mg twice daily. Please call your doctor at [facility] and tell them about your symptoms. Try to cut back on alcohol and dont take any NSAIDS such as ibuprofen or naproxen.   For the heart failure, start weighing yourself daily. If you gain 3 pounds in one day or 5 pounds in one week give the clinic a call and let us know.   I will look into reordering your cardiac MRI and someone will call you about this. IF you dont hear back from our office in the next week to two weeks please call us for an update.   FOLLOW-UP INSTRUCTIONS When: 1-2 weeks in the acute care clinic   For: Follow up of your heart burn  What to bring: all of your medication bottles   Please call our clinic if you have any questions or concerns, we may be able to help and keep you from a long and expensive emergency room wait. Our clinic and after hours phone number is [phone], the best time to call is Monday through Friday 9 am to 4 pm but there is always someone available 24/7 if you have an emergency. If you need medication refills please notify your pharmacy one week in advance and they will send [place] a request.

## 2017-08-14 NOTE — Progress Notes (Signed)
CC: abdominal pain   HPI:  Ethan Wilcox is a 62 y.o. with PMH hypertension, non ischemic cardiomyopathy ( EF 25-30%), obstructive sleep apnea, hypertension, hepatitis C, and polysubstance use who presents for follow up of hypertension. Please see the assessment and plans for the status of the patient chronic medical problems.   Review of Systems:  Refer to history of present illness and assessment and plans for pertinent review of systems, all others reviewed and negative  Physical Exam:  Vitals:   08/14/17 1526 08/14/17 1630  BP: (!) 130/97 102/66  Pulse: 90 (!) 48  Temp: 98.1 F (36.7 C)   TempSrc: Oral   SpO2: 98%   Weight: 258 lb 1.6 oz (117.1 kg)   Height: 5\' 7"  (1.702 m)    General: appears uncomfortable, no acute distress   Cardiac: regular rate and rhythm, no murmurs, normal S1 and S2, no peripheral edema or calf tenderness  Pulm: normal work of breathing, speaking in full sentences, lungs clear to ausculation over posterior lung fields  GI: bowel sounds are delayed, the abdomen is soft, tender to palpation in the epigastrium without guarding or rebound, no evidence of ascites   Assessment & Plan:   Epigastric abdominal pain  For the past four days he has had epigastric abdominal pain. He has been drinking high concentration lemon water for the past week in an attempt to lose weight for the past week. When he eats the pain is relieved for a little while but then it returns. He has nausea and belching. Denies waterbrash, vomiting, dysphagia, shortness of breath, chest pain, new diaphoresis. He has has been having normal non bloody or melanotic bowel movements, last one was today. He has been eating and drinking fine. He takes aspirin and tylenol, he is not on any NSAIDs. He drinks about 1 pint of liquor every other day, the pain became worse when he had a drink yesterday. He denies recent cocaine use. He does not have stress in his life. He has had pain similar to this  in the past, last month he had the pain for about 4 days and then it gradually went away. No prior EGD. He has been losing weight.  On further review of the chart after Mr. Turnbough left the office I am concerned with his recent cardiac history and advised him to come to the [facility] ED for an EKG. Mr. Camplin explained that he did not feel that his pain was severe enough to come now but he will present if things get worse. - increase omeprazole to 40 mg twice daily  - referral to GI for EGD  - return to clinic in one week, return precautions discussed   Non ischemic cardiomyopathy  Has been taking lasix and spironolactone daily. Feels that he has had good output with this. Does not weigh himself daily. Denies orthopnea, PND, lower extremity edema, dyspnea or chest pain on exertion. Last cath was in 2011 showed normal coronaries, last echo with EF 25-30%. Euvolemic on exam today   - encouraged daily weight, counseled on recording these and when to call  - continue spironolactone, losartan, lasix 40 mg BID, and carvedilol 25 mg BID  - continue fenofibrate  - encouraged to reschedule cardiac MRI   Hypertension History of orthostatic hypotension  Blood pressure is well controlled at 102/66 today.  - continue spironolactone 25 mg qd, carvedilol 25 mg BID, and losartan 100 mg qd   Depression  Says that he continues to feel  symptoms of this but does not want to be on medications or have talk therapy for this. His symptoms are best controlled when he keeps himself distracted and does not discuss his depression. He denies SI/HI.  - PHQ 2 is zero today  -discontinue Wellbutrin   See Encounters Tab for problem based charting.  Patient discussed with Dr. Heide Spark

## 2017-08-18 ENCOUNTER — Encounter: Payer: Self-pay | Admitting: Internal Medicine

## 2017-08-18 MED ORDER — OMEPRAZOLE 40 MG PO CPDR: 40.0000 mg | DELAYED_RELEASE_CAPSULE | Freq: Two times a day (BID) | ORAL | 11 refills | Status: AC

## 2017-08-18 MED ORDER — LOSARTAN POTASSIUM 100 MG PO TABS: 100.0000 mg | ORAL_TABLET | Freq: Every day | ORAL | 6 refills | Status: AC

## 2017-08-18 NOTE — Addendum Note (Signed)
Addended by: Earl Lagos on: 08/18/2017 12:41 PM   Modules accepted: Orders

## 2017-08-18 NOTE — Assessment & Plan Note (Signed)
For the past four days he has had epigastric abdominal pain. He has been drinking high concentration lemon water for the past week in an attempt to lose weight for the past week. When he eats the pain is relieved for a little while but then it returns. He has nausea and belching. Denies waterbrash, vomiting, dysphagia, shortness of breath, chest pain, new diaphoresis. He has has been having normal non bloody or melanotic bowel movements, last one was today. He has been eating and drinking fine. He takes aspirin and tylenol, he is not on any NSAIDs. He drinks about 1 pint of liquor every other day, the pain became worse when he had a drink yesterday. He denies recent cocaine use. He does not have stress in his life. He has had pain similar to this in the past, last month he had the pain for about 4 days and then it gradually went away. No prior EGD. He has been losing weight.  On further review of the chart after Ethan Wilcox left the office I am concerned with his recent cardiac history and advised him to come to the [facility] ED for an EKG. Ethan Wilcox explained that he did not feel that his pain was severe enough to come now but he will present if things get worse. - increase omeprazole to 40 mg twice daily  - referral to GI for EGD  - return to clinic in one week, return precautions discussed

## 2017-08-18 NOTE — Assessment & Plan Note (Signed)
Has been taking lasix and spironolactone daily. Feels that he has had good output with this. Does not weigh himself daily. Denies orthopnea, PND, lower extremity edema, dyspnea or chest pain on exertion. Last cath was in 2011 showed normal coronaries, last echo with EF 25-30%. Euvolemic on exam today   - encouraged daily weight, counseled on recording these and when to call  - continue spironolactone, losartan, lasix 40 mg BID, and carvedilol 25 mg BID  - continue fenofibrate  - encouraged to reschedule cardiac MRI

## 2017-08-18 NOTE — Assessment & Plan Note (Signed)
Blood pressure is well controlled at 102/66 today.  - continue spironolactone 25 mg qd, carvedilol 25 mg BID, and losartan 100 mg qd

## 2017-08-20 MED FILL — LOSARTAN POTASSIUM 100 MG T: 100 | 90 days supply | Qty: 90 | Fill #0

## 2017-08-20 NOTE — Progress Notes (Signed)
Internal Medicine Clinic Attending  Case discussed with Dr. Blum at the time of the visit.  We reviewed the resident's history and exam and pertinent patient test results.  I agree with the assessment, diagnosis, and plan of care documented in the resident's note. 

## 2017-08-21 ENCOUNTER — Ambulatory Visit (INDEPENDENT_AMBULATORY_CARE_PROVIDER_SITE_OTHER): Payer: Medicaid Other | Admitting: Internal Medicine

## 2017-08-21 ENCOUNTER — Other Ambulatory Visit: Payer: Self-pay

## 2017-08-21 VITALS — BP 119/68 | HR 80 | Temp 98.6°F | Ht 66.0 in | Wt 259.6 lb

## 2017-08-21 DIAGNOSIS — K219 Gastro-esophageal reflux disease without esophagitis: Secondary | ICD-10-CM | POA: Diagnosis not present

## 2017-08-21 DIAGNOSIS — Z79899 Other long term (current) drug therapy: Secondary | ICD-10-CM | POA: Diagnosis not present

## 2017-08-21 DIAGNOSIS — Z7289 Other problems related to lifestyle: Secondary | ICD-10-CM | POA: Diagnosis not present

## 2017-08-21 DIAGNOSIS — I5042 Chronic combined systolic (congestive) and diastolic (congestive) heart failure: Secondary | ICD-10-CM

## 2017-08-21 NOTE — Progress Notes (Signed)
Internal Medicine Clinic Attending  I saw and evaluated the patient.  I personally confirmed the key portions of the history and exam documented by Dr. Agyei and I reviewed pertinent patient test results.  The assessment, diagnosis, and plan were formulated together and I agree with the documentation in the resident's note.  

## 2017-08-21 NOTE — Patient Instructions (Addendum)
Mr. Solin, next  Was a pleasure taking care of you here at the clinic and I am very glad to hear that your symptoms have been improving.  Here is a summary of my recommendation after today's visit:  1.  Continue omeprazole 40 mg twice daily on an empty stomach 2.  You should receive a call from the GI doctors to schedule the EGD 3.  Follow-up with your cardiologist for management of your heart failure 4.  Please consider measuring your weight daily  I will see you back at the clinic in 4 months.  If your symptoms get worse, you can always come in sooner.  ~Dr. Dortha Schwalbe

## 2017-08-21 NOTE — Progress Notes (Signed)
   CC: Follow up Dyspepsia   HPI:  [name] is a 62 y.o. African-American gentleman with past medical history as noted for follow-up dyspepsia.   Dyspepsia: Has a long-standing history of GERD and recently presented with epigastric abdominal pain that worsens with meals.  In the past she was taking omeprazole 40 mg daily.  He was seen in the clinic on 08/14/2017 and omeprazole was increased from 4 daily to 40 mg twice daily.  Today, reports of resolving symptoms and denies epigastric pain, belching, burning sensation, melenic stools, hematochezia, bright red blood per rectum.  He has several risk factors of gastritis including heavy alcohol use as he states that he drinks half a pint of liquor every day in order to help him sleep and will require further evaluation by GI. -Continue omeprazole 40 mg twice daily -Will place referral for GI or EGD -Counseled on decrease alcohol intake and avoidance of NSAIDs  Past Medical History:  Diagnosis Date  . Active smoker /   1/2 ppd  . Chronic combined systolic and diastolic CHF (congestive heart failure) (HCC)   . Cocaine abuse (HCC)   . ETOH abuse   . GERD (gastroesophageal reflux disease)   . Gout   . HCV (hepatitis C virus)   . Hx of anaphylaxis with angioedema 08/23/2012   Admitted with angioedema on 08/24/2012 No intubation Unknown allergen Shrimp Vs Spices Vs ACEis Stopped Lisinopril   . Hypertension   . Morbid obesity (HCC)   . Nonischemic cardiomyopathy (HCC)    admitted 5/11 w acute systolic CHF. LHC showed no angiographic CAD. Echo (5/11) showed EF 15-20% w diffuse sever hypokinesis, moderate MR> RHC showed PCWP 10, CI 1.47 (Fick.) cause of CMP thought to be heavy ETOH and cocaine. No ICD due to ongoing ETOH intake  . Sleep apnea    Review of Systems: As per HPI  Physical Exam:  Vitals:   08/21/17 0914  BP: 119/68  Pulse: 80  Temp: 98.6 F (37 C)  TempSrc: Oral  SpO2: 97%  Weight: 259 lb 9.6 oz (117.8 kg)  Height: 5'  6" (1.676 m)   Constitutional: In no apparent distress Cardiovascular: Regular rate and rhythm.  No murmur, gallops, rubs Respiratory: Clear to auscultation Abdominal: Distention secondary to body habitus, bowel sounds present, nontender to palpation Extremities: No pitting edema  Assessment & Plan:   See Encounters Tab for problem based charting.  Patient discussed with Dr Rogelia Boga

## 2017-08-27 ENCOUNTER — Telehealth: Payer: Self-pay | Admitting: Internal Medicine

## 2017-08-27 MED FILL — OMEPRAZOLE 40 MG CPDR: 40 | 30 days supply | Qty: 60 | Fill #0

## 2017-08-27 MED FILL — SPIRONOLACTONE 25 MG TABLET: 25 | 90 days supply | Qty: 90 | Fill #2

## 2017-08-27 NOTE — Telephone Encounter (Signed)
Hi Ethan Wilcox,  Dr. Obie Dredge placed a referral for GI on 08/14/2017 and should have received a call to make an appointment.   Thanks

## 2017-08-27 NOTE — Telephone Encounter (Signed)
Rec'd a call from the patient asking about his GI referral from his last visit with you on 08/21/2017.  Please advise if a Referral was to be placed as patient states his stomach pain is getting worse.

## 2017-08-29 ENCOUNTER — Other Ambulatory Visit: Payer: Self-pay | Admitting: Internal Medicine

## 2017-08-29 NOTE — Telephone Encounter (Signed)
#  [phone] with 6 refills sent 08/06/2017. Confirmed with French Ana at [facility] that they have this Rx and will get it ready for him. Kinnie Feil, RN, BSN

## 2017-08-29 NOTE — Telephone Encounter (Signed)
Needs refills on furosemide (LASIX) 40 MG tablet @ [facility] outpatient pharmacy; pt contact # [phone]

## 2017-08-31 MED FILL — FUROSEMIDE 40 MG TAB: 40 | 30 days supply | Qty: 60 | Fill #0

## 2017-09-05 ENCOUNTER — Other Ambulatory Visit: Payer: Self-pay | Admitting: Internal Medicine

## 2017-09-05 DIAGNOSIS — R1013 Epigastric pain: Secondary | ICD-10-CM

## 2017-09-05 NOTE — Telephone Encounter (Signed)
Thanks Chilon,   I have placed a new referral. Not entirely sure if the referral was cancelled on accident.

## 2017-09-05 NOTE — Telephone Encounter (Signed)
The referral placed on 08/14/2017 was closed by Dr. Obie Dredge.  A new one must be placed in order to help arrange an appt.  Please Advise.

## 2017-09-06 ENCOUNTER — Telehealth: Payer: Self-pay | Admitting: Internal Medicine

## 2017-09-11 ENCOUNTER — Ambulatory Visit (HOSPITAL_COMMUNITY)
Admission: RE | Admit: 2017-09-11 | Discharge: 2017-09-11 | Disposition: A | Discharge status: Home / Self Care | Payer: Medicaid Other | Source: Ambulatory Visit | Attending: Cardiology | Admitting: Cardiology

## 2017-09-11 VITALS — BP 94/62 | HR 66 | Wt 247.0 lb

## 2017-09-11 DIAGNOSIS — E669 Obesity, unspecified: Secondary | ICD-10-CM | POA: Insufficient documentation

## 2017-09-11 DIAGNOSIS — E785 Hyperlipidemia, unspecified: Secondary | ICD-10-CM

## 2017-09-11 DIAGNOSIS — F17210 Nicotine dependence, cigarettes, uncomplicated: Secondary | ICD-10-CM | POA: Diagnosis not present

## 2017-09-11 DIAGNOSIS — I5042 Chronic combined systolic (congestive) and diastolic (congestive) heart failure: Secondary | ICD-10-CM

## 2017-09-11 DIAGNOSIS — F1010 Alcohol abuse, uncomplicated: Secondary | ICD-10-CM | POA: Diagnosis not present

## 2017-09-11 DIAGNOSIS — R9431 Abnormal electrocardiogram [ECG] [EKG]: Secondary | ICD-10-CM | POA: Diagnosis not present

## 2017-09-11 DIAGNOSIS — I491 Atrial premature depolarization: Secondary | ICD-10-CM | POA: Diagnosis not present

## 2017-09-11 DIAGNOSIS — I428 Other cardiomyopathies: Secondary | ICD-10-CM | POA: Diagnosis not present

## 2017-09-11 DIAGNOSIS — Z6839 Body mass index (BMI) 39.0-39.9, adult: Secondary | ICD-10-CM | POA: Diagnosis not present

## 2017-09-11 DIAGNOSIS — Z09 Encounter for follow-up examination after completed treatment for conditions other than malignant neoplasm: Secondary | ICD-10-CM | POA: Insufficient documentation

## 2017-09-11 DIAGNOSIS — I5022 Chronic systolic (congestive) heart failure: Secondary | ICD-10-CM | POA: Diagnosis not present

## 2017-09-11 DIAGNOSIS — Z79899 Other long term (current) drug therapy: Secondary | ICD-10-CM | POA: Insufficient documentation

## 2017-09-11 DIAGNOSIS — Z7982 Long term (current) use of aspirin: Secondary | ICD-10-CM | POA: Insufficient documentation

## 2017-09-11 DIAGNOSIS — I429 Cardiomyopathy, unspecified: Secondary | ICD-10-CM | POA: Insufficient documentation

## 2017-09-11 DIAGNOSIS — I959 Hypotension, unspecified: Secondary | ICD-10-CM | POA: Diagnosis not present

## 2017-09-11 DIAGNOSIS — G4733 Obstructive sleep apnea (adult) (pediatric): Secondary | ICD-10-CM | POA: Insufficient documentation

## 2017-09-11 DIAGNOSIS — F17200 Nicotine dependence, unspecified, uncomplicated: Secondary | ICD-10-CM

## 2017-09-11 LAB — LIPID PANEL
CHOL/HDL RATIO: 5.9 ratio
Cholesterol: 148 mg/dL (ref 0–200)
HDL: 25 mg/dL — AB (ref >40)
LDL CALC: 65 mg/dL (ref 0–99)
Triglycerides: 290 mg/dL — ABNORMAL HIGH (ref <150)
VLDL: 58 mg/dL — AB (ref 0–40)

## 2017-09-11 LAB — BASIC METABOLIC PANEL
Anion gap: 15 (ref 5–15)
BUN: 12 mg/dL (ref 6–20)
CHLORIDE: 99 mmol/L (ref 98–111)
CO2: 24 mmol/L (ref 22–32)
CREATININE: 1.12 mg/dL (ref 0.61–1.24)
Calcium: 9.6 mg/dL (ref 8.9–10.3)
GFR calc non Af Amer: >60 mL/min (ref >60)
Glucose, Bld: 127 mg/dL — ABNORMAL HIGH (ref 70–99)
Potassium: 4 mmol/L (ref 3.5–5.1)
Sodium: 138 mmol/L (ref 135–145)

## 2017-09-11 MED ORDER — CARVEDILOL 25 MG PO TABS: 25.0000 mg | ORAL_TABLET | Freq: Two times a day (BID) | ORAL | 6 refills | Status: AC

## 2017-09-11 MED FILL — CARVEDILOL 25 MG TABLET: 25 | 30 days supply | Qty: 60 | Fill #0

## 2017-09-11 NOTE — Patient Instructions (Signed)
Labs today  Your physician has requested that you have a cardiac MRI. Cardiac MRI uses a computer to create images of your heart as its beating, producing both still and moving pictures of your heart and major blood vessels. For further information please visit [email or web address]. Please follow the instruction sheet given to you today for more information.  You have been referred to Cardiac Rehab, they will call you to schedule  Your physician recommends that you schedule a follow-up appointment in: 3 months

## 2017-09-11 NOTE — Progress Notes (Signed)
Patient ID: Ethan Wilcox, male   DOB: 1955-07-08, 62 y.o.   MRN: [id number]   PCP: Ethan Pont, MD Cardiology: Dr. Shirlee Wilcox  62 y.o. with history of nonischemic cardiomyopathy presents for cardiology followup. Ethan Wilcox had a prolonged admission in 5/11 for CHF. Echo showed EF 15-20% with diffuse hypokinesis. Left and right heart cath showed no angiographic CAD and Fick CI 1.47. Patient became hypotensive with ARF (cardiogenic shock) requiring milrinone and dopamine. He was gradually titrated off these medications and begun on an oral regimen.  He saw Dr. Ladona Wilcox regarding an ICD but actually at that time admitted to resuming ETOH intake. Therefore, no ICD placed.  Echo in 5/15 showed EF 30-35%, diffuse hypokinesis.  He has had a hospitalization with angioedema.  This actually may have been related to eating shellfish but lisinopril was stopped.  He was admitted for a CHF exacerbation in 8/18.  He had an episode of syncope in 5/19 thought to be orthostatic (dehydration in setting of bowel prep).   He returns for followup of CHF. He still smokes 1/2 ppd and is drinking 1 pint liquor 3 times/week.  Trying to cut back on both.  No further lightheadedness or syncope.  No chest pain.  No peripheral edema.  Weight is down 22 lbs, feels like higher dose of torsemide keeps fluid off better.  Dyspnea after walking 50-100 yards.  Has CPAP but is not using it.    ECG (personally reviewed): NSR, PACs/PVCs  Labs (5/11): K 4.4, creatinine 1.23, LDL 104, HDL 33  Labs (6/11): BNP 97, K 4.8, creatinine 1.4  Labs (7/11): K 4.8, creatinine 1.0, BNP 53  Labs (10/11): BNP 25, K 4.8 => 4.3, creatinine 1.4 => 1.3  Labs (12/11): BNP 25, K 4.9, creatinine 1.2  Labs (3/12): K 4.3, creatinine 1.2 Labs (11/12): K 4, creatinine 1.3, BNP 82, TGs 518, HDL 39, HDL 60 Labs (2/13): AST 72, ALT 96 Labs (4/13): K 4.4, creatinine 0.95, proBNP 185 Labs (5/13): K 4.3, creatinine 1.2, BNP 32 Labs (7/13): K 4.1, creatinine 1.2, BNP  15 Labs (8/13): K 4.6, creatinine 1.0 Labs (11/13): K 3.9, creatinine 1.1, BNP 32 Labs (4/14): K 4.8, creatinine 1.2, BNP 28 Labs (8/14): K 4.1, creatinine 1.0 Labs (2/15): K 4.1, creatinine 1.09 Labs (3/15): K 3.8, creatinine 1.1 Labs (8/15): K 3.5, creatinine 1.1 Labs (4/17): K 4.5, creatinine 1.36 Labs (8/18): K 3.8, creatinine 1.16 Labs (5/19): K 3.5, creatinine 1.32  Allergies (verified):  1) ! Penicillin   Past Medical History:  1. Nonischemic Cardiomyopathy: Admitted 5/11 with acute systolic CHF. LHC showed no angiographic CAD. Echo (5/11) showed EF 15-20% with diffuse severe hypokinesis, moderate MR. RHC showed PCWP 10, CI 1.47 (Fick).  Echo (4/14) with EF 35%, diffuse hypokinesis.  Echo (5/15) with EF 30-35%, mild LV dilation, mild MR.  Cause of CMP thought to be heavy ETOH and cocaine. No ICD yet due to ongoing ETOH intake.  - Echo (3/17) with EF 40-45%, diffuse hypokinesis, mild MR.  - Unable to tolerate Bidil due to headaches. - Echo (8/18): EF 25-30%, diffuse hypokinesis.  2. ETOH abuse 3. Cocaine abuse: Quit in 2011  4. Active smoker: 1/2 ppd  5. Obesity  6. OSA: Severe. Not using CPAP.  7. Angioedema: ? ACEI or shellfish related.   8. HCV 9. Syncope 5/19: Thought to be orthostatic, related to dehydration.   Family History:  "Heart disease" in mother   Social History:  Worked as [employer]. Smokes 1/2 ppd. Prior cocaine,  last use in 2011. Heavy ETOH in the past, now drinking around 1/2 pint liquor daily.    ROS: All systems reviewed and negative except as per HPI.    Current Outpatient Medications  Medication Sig Dispense Refill  . acetaminophen (TYLENOL) 500 MG tablet Take 1,000 mg by mouth as needed for mild pain.    [name] albuterol (PROVENTIL HFA;VENTOLIN HFA) 108 (90 Base) MCG/ACT inhaler Inhale 2 puffs into the lungs every 4 (four) hours as needed for wheezing or shortness of breath. 1 Inhaler 1  . aspirin (ASPIR-LOW) 81 MG EC tablet Take 81 mg by mouth daily.      . carvedilol (COREG) 25 MG tablet Take 1 tablet (25 mg total) by mouth 2 (two) times daily. 180 tablet 6  . Dextromethorphan-Benzocaine (COUGH/SORE THROAT LOZENGES MT) Use as directed 1-3 drops in the mouth or throat daily as needed (cough).    . diphenhydrAMINE (BENADRYL) 25 MG tablet Take 25 mg by mouth every 6 (six) hours as needed for allergies. Reported on 05/24/2015    . fenofibrate (TRICOR) 145 MG tablet Take 1 tablet (145 mg total) by mouth daily. 30 tablet 6  . furosemide (LASIX) 40 MG tablet Take 1 tablet (40 mg total) by mouth 2 (two) times daily. Take 40 mg (1 tab) in AM and 40 mg (1 tab) in  PM 180 tablet 6  . guaifenesin (ROBITUSSIN) 100 MG/5ML syrup Take 200 mg by mouth 3 (three) times daily as needed for cough.    . losartan (COZAAR) 100 MG tablet Take 1 tablet (100 mg total) by mouth daily. 90 tablet 6  . Multiple Vitamin (MULTIVITAMIN WITH MINERALS) TABS Take 1 tablet by mouth daily.    [name] omeprazole (PRILOSEC) 40 MG capsule Take 1 capsule (40 mg total) by mouth 2 (two) times daily. 60 capsule 11  . polyethylene glycol powder (GLYCOLAX/MIRALAX) powder Take 17 g of powder with 4-8 ounces of fluid daily for constipation. 255 g 3  . simethicone (MYLICON) 125 MG chewable tablet Chew 125 mg by mouth as needed for flatulence.    [name] spironolactone (ALDACTONE) 25 MG tablet Take 1 tablet (25 mg total) by mouth daily. 90 tablet 3   No current facility-administered medications for this encounter.     BP 94/62   Pulse 66   Wt 112 kg (247 lb)   SpO2 97%   BMI 39.87 kg/m  General: NAD, obese Neck: Thick, no JVD, no thyromegaly or thyroid nodule.  Lungs: Clear to auscultation bilaterally with normal respiratory effort. CV: Nondisplaced PMI.  Heart regular S1/S2, no S3/S4, no murmur.  No peripheral edema.  No carotid bruit.  Normal pedal pulses.  Abdomen: Soft, nontender, no hepatosplenomegaly, no distention.  Skin: Intact without lesions or rashes.  Neurologic: Alert and oriented x 3.   Psych: Normal affect. Extremities: No clubbing or cyanosis.  HEENT: Normal.   Assessment/Plan:  CHRONIC SYSTOLIC HEART FAILURE  Nonischemic cardiomyopathy, possibly ETOH-related.  Echo in 5/15 with EF 30-35%, improved on 3/17 echo to EF 40-45%. Echo in 8/18 with EF back down to 25-30%.  Stable NYHA class II-III symptoms.  He is still drinking but has cut back considerably. He had angioedema with ACEI use. He does not look volume overloaded.  - Continue current doses of Coreg, losartan, and spironolactone.  - He cannot take Bidil due to headaches. - Cannot transition losartan to Entresto given angioedema risk (had angioedema with ACEI). - Continue Lasix 60 qam/40 qpm with BMET today.  - I will  arrange for cardiac MRI to assess for infiltrative disease.  If he is too large for MRI, will change to echo.  If EF remains low, will be ICD candidate.  Narrow QRS so no CRT.  - I will refer him to cardiac rehab.  OSA  I encouraged him to use his CPAP.  I will refer him back to sleep medicine MD.  TOBACCO ABUSE I counselled him again to quit smoking. He cannot take Chantix.  ETOH ABUSE He has cut back on ETOH use but is still drinking.  I encouraged him to cut back to no or minimal ETOH.   Followup in 3 months.    Ethan Wilcox 09/11/2017

## 2017-09-12 ENCOUNTER — Encounter: Payer: Self-pay | Admitting: Student

## 2017-09-13 ENCOUNTER — Telehealth (HOSPITAL_COMMUNITY): Payer: Self-pay

## 2017-09-13 NOTE — Telephone Encounter (Signed)
Patients insurance is active through [facility] - passport/reference [phone]

## 2017-09-21 ENCOUNTER — Ambulatory Visit (HOSPITAL_COMMUNITY)
Admission: RE | Admit: 2017-09-21 | Discharge: 2017-09-21 | Disposition: A | Discharge status: Home / Self Care | Payer: Medicaid Other | Source: Ambulatory Visit | Attending: Student | Admitting: Student

## 2017-09-21 DIAGNOSIS — I428 Other cardiomyopathies: Secondary | ICD-10-CM | POA: Diagnosis not present

## 2017-09-21 DIAGNOSIS — I5022 Chronic systolic (congestive) heart failure: Secondary | ICD-10-CM | POA: Insufficient documentation

## 2017-09-21 MED ORDER — GADOBUTROL 1 MMOL/ML IV SOLN
17.5000 mL | Freq: Once | INTRAVENOUS | Status: AC | PRN
  Administered 2017-09-21: 17.5 mL via INTRAVENOUS

## 2017-09-25 ENCOUNTER — Telehealth (HOSPITAL_COMMUNITY): Payer: Self-pay

## 2017-09-25 NOTE — Telephone Encounter (Signed)
Called patient to see if he was interested in participating in the Cardiac Rehab Program. Patient stated yes. Patient will come in for orientation on 11/15/17 @ 8:30AM and will attend the 11:15AM exercise class.  Mailed homework package.  Went over insurance, patient verbalized understanding.

## 2017-09-27 ENCOUNTER — Telehealth (HOSPITAL_COMMUNITY): Payer: Self-pay | Admitting: *Deleted

## 2017-09-27 DIAGNOSIS — I5042 Chronic combined systolic (congestive) and diastolic (congestive) heart failure: Secondary | ICD-10-CM

## 2017-09-27 NOTE — Telephone Encounter (Signed)
Result Notes for MR CARDIAC MORPHOLOGY W WO CONTRAST   Notes recorded by Georgina Peer, RN on 09/27/2017 at 8:53 AM EDT Patient is willing to go and discuss possible ICD with EP. Referral placed and patient is aware they will contact him for appointment. ------  Notes recorded by Graciella Freer, PA-C on 09/26/2017 at 7:43 AM EDT Refer to EP. ------  Notes recorded by Laurey Morale, MD on 09/24/2017 at 8:42 PM EDT Needs ICD, would refer to EP if amenable. ------  Notes recorded by Graciella Freer, PA-C on 09/24/2017 at 1:20 PM EDT Plan per Dr. Shirlee Latch. ? Consider ICD

## 2017-10-01 MED FILL — OMEPRAZOLE 40 MG CPDR: 40 | 30 days supply | Qty: 60 | Fill #1

## 2017-10-01 MED FILL — FUROSEMIDE 40 MG TAB: 40 | 30 days supply | Qty: 60 | Fill #1

## 2017-10-18 MED FILL — CARVEDILOL 25 MG TABLET: 25 | 30 days supply | Qty: 60 | Fill #1

## 2017-10-31 MED FILL — FUROSEMIDE 40 MG TAB: 40 | 30 days supply | Qty: 60 | Fill #2

## 2017-11-12 MED FILL — OMEPRAZOLE 40 MG CPDR: 40 | 30 days supply | Qty: 60 | Fill #2

## 2017-11-12 NOTE — Progress Notes (Signed)
Ethan Wilcox 62 y.o. male DOB [birth year] MRN [id number]       Nutrition  No diagnosis found. Past Medical History:  Diagnosis Date  . Active smoker /   1/2 ppd  . Chronic combined systolic and diastolic CHF (congestive heart failure) (HCC)   . Cocaine abuse (HCC)   . ETOH abuse   . GERD (gastroesophageal reflux disease)   . Gout   . HCV (hepatitis C virus)   . Hx of anaphylaxis with angioedema 08/23/2012   Admitted with angioedema on 08/24/2012 No intubation Unknown allergen Shrimp Vs Spices Vs ACEis Stopped Lisinopril   . Hypertension   . Morbid obesity (HCC)   . Nonischemic cardiomyopathy (HCC)    admitted 5/11 w acute systolic CHF. LHC showed no angiographic CAD. Echo (5/11) showed EF 15-20% w diffuse sever hypokinesis, moderate MR> RHC showed PCWP 10, CI 1.47 (Fick.) cause of CMP thought to be heavy ETOH and cocaine. No ICD due to ongoing ETOH intake  . Sleep apnea    Meds reviewed.     Current Outpatient Medications (Cardiovascular):  .  carvedilol (COREG) 25 MG tablet, Take 1 tablet (25 mg total) by mouth 2 (two) times daily. .  fenofibrate (TRICOR) 145 MG tablet, Take 1 tablet (145 mg total) by mouth daily. .  furosemide (LASIX) 40 MG tablet, Take 1 tablet (40 mg total) by mouth 2 (two) times daily. Take 40 mg (1 tab) in AM and 40 mg (1 tab) in  PM .  losartan (COZAAR) 100 MG tablet, Take 1 tablet (100 mg total) by mouth daily. [name]  spironolactone (ALDACTONE) 25 MG tablet, Take 1 tablet (25 mg total) by mouth daily.  Current Outpatient Medications (Respiratory):  .  albuterol (PROVENTIL HFA;VENTOLIN HFA) 108 (90 Base) MCG/ACT inhaler, Inhale 2 puffs into the lungs every 4 (four) hours as needed for wheezing or shortness of breath. .  diphenhydrAMINE (BENADRYL) 25 MG tablet, Take 25 mg by mouth every 6 (six) hours as needed for allergies. Reported on 05/24/2015 .  guaifenesin (ROBITUSSIN) 100 MG/5ML syrup, Take 200 mg by mouth 3 (three) times daily as needed for  cough.  Current Outpatient Medications (Analgesics):  .  acetaminophen (TYLENOL) 500 MG tablet, Take 1,000 mg by mouth as needed for mild pain. [name]  aspirin (ASPIR-LOW) 81 MG EC tablet, Take 81 mg by mouth daily.    Current Outpatient Medications (Other):  [name]  Dextromethorphan-Benzocaine (COUGH/SORE THROAT LOZENGES MT), Use as directed 1-3 drops in the mouth or throat daily as needed (cough). .  Multiple Vitamin (MULTIVITAMIN WITH MINERALS) TABS, Take 1 tablet by mouth daily. [name]  omeprazole (PRILOSEC) 40 MG capsule, Take 1 capsule (40 mg total) by mouth 2 (two) times daily. .  polyethylene glycol powder (GLYCOLAX/MIRALAX) powder, Take 17 g of powder with 4-8 ounces of fluid daily for constipation. .  simethicone (MYLICON) 125 MG chewable tablet, Chew 125 mg by mouth as needed for flatulence.   HT: Ht Readings from Last 1 Encounters:  08/21/17 5\' 6"  (1.676 m)    WT: Wt Readings from Last 5 Encounters:  09/11/17 247 lb (112 kg)  08/21/17 259 lb 9.6 oz (117.8 kg)  08/14/17 258 lb 1.6 oz (117.1 kg)  06/19/17 262 lb 12.8 oz (119.2 kg)  05/30/17 260 lb 9.6 oz (118.2 kg)     BMI of 41.92   Current tobacco use? yes       Labs:  Lipid Panel     Component Value Date/Time   CHOL 148  09/11/2017 1103   TRIG 290 (H) 09/11/2017 1103   HDL 25 (L) 09/11/2017 1103   CHOLHDL 5.9 09/11/2017 1103   VLDL 58 (H) 09/11/2017 1103   LDLCALC 65 09/11/2017 1103   LDLDIRECT 35 07/22/2014 0246    Lab Results  Component Value Date   HGBA1C 7.2 (H) 08/11/2016   CBG (last 3)  No results for input(s): GLUCAP in the last 72 hours.  Nutrition Diagnosis ? Food-and nutrition-related knowledge deficit related to lack of exposure to information as related to diagnosis of: ? CVD ? Type 2 Diabetes ? Obese  III = 40+ related to excessive energy intake as evidenced by a BMI of 41.92  Nutrition Goal(s):  ? To be determined  Plan:  Pt to attend nutrition classes ? Nutrition I ? Nutrition II ? Portion  Distortion  ? Diabetes Blitz ? Diabetes Q & A Will provide client-centered nutrition education as part of interdisciplinary care.   Monitor and evaluate progress toward nutrition goal with team.  Ross Marcus, MS, RD, LDN 11/12/2017 2:47 PM

## 2017-11-14 ENCOUNTER — Telehealth (HOSPITAL_COMMUNITY): Payer: Self-pay

## 2017-11-14 NOTE — Telephone Encounter (Signed)
Patient states he will not be able to make it for his cardiac rehab appointment due to a recent death of a family member or friend. He will call the cardiac rehab clinic to reschedule when able.  Danae Orleans, PharmD PGY1 Pharmacy Resident Phone [phone] 11/14/2017       12:51 PM\

## 2017-11-14 NOTE — Telephone Encounter (Signed)
Per pharmacist - pt will call to reschedule cr as there was a death in the family. Cancelled all appointments and placed patient paperwork in cancellation bin.

## 2017-11-15 ENCOUNTER — Inpatient Hospital Stay (HOSPITAL_COMMUNITY)
Admission: RE | Admit: 2017-11-15 | Discharge: 2017-11-15 | Disposition: A | Discharge status: Home / Self Care | Payer: Medicaid Other | Source: Ambulatory Visit

## 2017-11-19 MED FILL — LOSARTAN POTASSIUM 100 MG T: 100 | 90 days supply | Qty: 90 | Fill #1

## 2017-11-21 ENCOUNTER — Ambulatory Visit (HOSPITAL_COMMUNITY): Payer: Medicaid Other

## 2017-11-23 ENCOUNTER — Ambulatory Visit (HOSPITAL_COMMUNITY): Payer: Medicaid Other

## 2017-11-26 ENCOUNTER — Ambulatory Visit (HOSPITAL_COMMUNITY): Payer: Medicaid Other

## 2017-11-28 ENCOUNTER — Ambulatory Visit (HOSPITAL_COMMUNITY): Payer: Medicaid Other

## 2017-11-29 MED FILL — SPIRONOLACTONE 25 MG TABLET: 25 | 90 days supply | Qty: 90 | Fill #3

## 2017-11-30 ENCOUNTER — Ambulatory Visit (HOSPITAL_COMMUNITY): Payer: Medicaid Other

## 2017-11-30 MED FILL — FUROSEMIDE 40 MG TAB: 40 | 30 days supply | Qty: 60 | Fill #3

## 2017-11-30 MED FILL — CARVEDILOL 25 MG TABLET: 25 | 30 days supply | Qty: 60 | Fill #2

## 2017-12-03 ENCOUNTER — Ambulatory Visit (HOSPITAL_COMMUNITY): Payer: Medicaid Other

## 2017-12-05 ENCOUNTER — Ambulatory Visit (HOSPITAL_COMMUNITY): Payer: Medicaid Other

## 2017-12-07 ENCOUNTER — Ambulatory Visit (HOSPITAL_COMMUNITY): Payer: Medicaid Other

## 2017-12-10 ENCOUNTER — Ambulatory Visit (HOSPITAL_COMMUNITY): Payer: Medicaid Other

## 2017-12-11 ENCOUNTER — Inpatient Hospital Stay (HOSPITAL_COMMUNITY): Admission: RE | Admit: 2017-12-11 | Payer: Medicaid Other | Source: Ambulatory Visit

## 2017-12-12 ENCOUNTER — Ambulatory Visit (HOSPITAL_COMMUNITY): Payer: Medicaid Other

## 2017-12-14 ENCOUNTER — Ambulatory Visit (HOSPITAL_COMMUNITY): Payer: Medicaid Other

## 2017-12-17 ENCOUNTER — Ambulatory Visit (HOSPITAL_COMMUNITY): Payer: Medicaid Other

## 2017-12-19 ENCOUNTER — Ambulatory Visit (HOSPITAL_COMMUNITY): Payer: Medicaid Other

## 2017-12-21 ENCOUNTER — Ambulatory Visit (HOSPITAL_COMMUNITY): Payer: Medicaid Other

## 2017-12-24 ENCOUNTER — Ambulatory Visit (HOSPITAL_COMMUNITY): Payer: Medicaid Other

## 2017-12-26 ENCOUNTER — Ambulatory Visit (HOSPITAL_COMMUNITY): Payer: Medicaid Other

## 2017-12-28 ENCOUNTER — Ambulatory Visit (HOSPITAL_COMMUNITY): Payer: Medicaid Other

## 2017-12-31 ENCOUNTER — Ambulatory Visit (HOSPITAL_COMMUNITY): Payer: Medicaid Other

## 2018-01-04 ENCOUNTER — Ambulatory Visit (HOSPITAL_COMMUNITY): Payer: Medicaid Other

## 2018-01-07 ENCOUNTER — Ambulatory Visit (HOSPITAL_COMMUNITY): Payer: Medicaid Other

## 2018-01-11 ENCOUNTER — Ambulatory Visit (HOSPITAL_COMMUNITY): Payer: Medicaid Other

## 2018-01-14 ENCOUNTER — Ambulatory Visit (HOSPITAL_COMMUNITY): Payer: Medicaid Other

## 2018-01-16 ENCOUNTER — Ambulatory Visit (HOSPITAL_COMMUNITY): Payer: Medicaid Other

## 2018-01-18 ENCOUNTER — Ambulatory Visit (HOSPITAL_COMMUNITY): Payer: Medicaid Other

## 2018-01-21 ENCOUNTER — Ambulatory Visit (HOSPITAL_COMMUNITY): Payer: Medicaid Other

## 2018-01-23 ENCOUNTER — Ambulatory Visit (HOSPITAL_COMMUNITY): Payer: Medicaid Other

## 2018-01-25 ENCOUNTER — Ambulatory Visit (HOSPITAL_COMMUNITY): Payer: Medicaid Other

## 2018-01-28 ENCOUNTER — Ambulatory Visit (HOSPITAL_COMMUNITY): Payer: Medicaid Other

## 2018-01-30 ENCOUNTER — Ambulatory Visit (HOSPITAL_COMMUNITY): Payer: Medicaid Other

## 2018-02-01 ENCOUNTER — Ambulatory Visit (HOSPITAL_COMMUNITY): Payer: Medicaid Other

## 2018-02-04 ENCOUNTER — Ambulatory Visit (HOSPITAL_COMMUNITY): Payer: Medicaid Other

## 2018-02-06 ENCOUNTER — Ambulatory Visit (HOSPITAL_COMMUNITY): Payer: Medicaid Other

## 2018-02-08 ENCOUNTER — Ambulatory Visit (HOSPITAL_COMMUNITY): Payer: Medicaid Other

## 2018-02-11 ENCOUNTER — Ambulatory Visit (HOSPITAL_COMMUNITY): Payer: Medicaid Other

## 2018-02-11 NOTE — Progress Notes (Deleted)
Colon cancer screening  Influenza vaccine   Hypertension History of orthostatic hypotension  Patient is here for follow up of hypertension. Comorbidity is non ischemic cardiomyopathy with systolic congestive heart failure with last LVEF 25-30%. Currently he is prescribed spironolactone 25 mg qd, carvedilol 25 mg BID, losartan 100 mg qd, and lasix 40 mg BID.   Non ischemic cardiomyopathy  Systolic congestive heart failure  - carvedilol 25 mg BID, spironolactone 25 mg qd, lasix 40 mg BID  - unable to tolerate Bidil due to headaches  - cardiology following regarding the appropriate timing of ICD placement, he was referred to EP 09/2017  - appreciate cardiology recommendations   Dyspepsia  - referral placed to gastroenterology at last office visi t - omeprazole 40 mg BID   Severe OSA  - not on CPAP   Ethan Wilcox is a 63 y.o. with PMH hypertension, non ischemic cardiomyopathy ( EF 25-30%), obstructive sleep apnea, hypertension, hepatitis C, and cigarette and ETOH use who presents for follow up of hypertension.   Please see the assessment and plans for the status of the patient chronic medical problems

## 2018-02-12 ENCOUNTER — Encounter: Payer: Medicaid Other | Admitting: Internal Medicine

## 2018-02-12 ENCOUNTER — Encounter: Payer: Self-pay | Admitting: Internal Medicine

## 2018-02-13 ENCOUNTER — Ambulatory Visit (HOSPITAL_COMMUNITY): Payer: Medicaid Other

## 2018-02-15 ENCOUNTER — Ambulatory Visit (HOSPITAL_COMMUNITY): Payer: Medicaid Other

## 2018-02-18 ENCOUNTER — Ambulatory Visit (HOSPITAL_COMMUNITY): Payer: Medicaid Other

## 2018-02-20 ENCOUNTER — Ambulatory Visit (HOSPITAL_COMMUNITY): Payer: Medicaid Other

## 2018-02-22 ENCOUNTER — Ambulatory Visit (HOSPITAL_COMMUNITY): Payer: Medicaid Other

## 2018-02-22 ENCOUNTER — Telehealth (HOSPITAL_COMMUNITY): Payer: Self-pay

## 2018-02-22 NOTE — Telephone Encounter (Signed)
Attempted to call patient in regards to Cardiac Rehab - LM on VM °Gloria W. Support Rep II °

## 2018-07-04 ENCOUNTER — Encounter: Payer: Self-pay | Admitting: *Deleted

## 2018-10-30 ENCOUNTER — Ambulatory Visit: Payer: Self-pay

## 2018-10-30 ENCOUNTER — Encounter: Payer: Self-pay | Admitting: Radiation Oncology

## 2019-05-31 IMAGING — DX DG CHEST 2V
2 series · 2 of 2 positions shown · non-contrast
Comparison: 08/09/2016

CLINICAL DATA: Dizziness today, hypertension, smoker

EXAM:
CHEST - 2 VIEW

[chest ap]
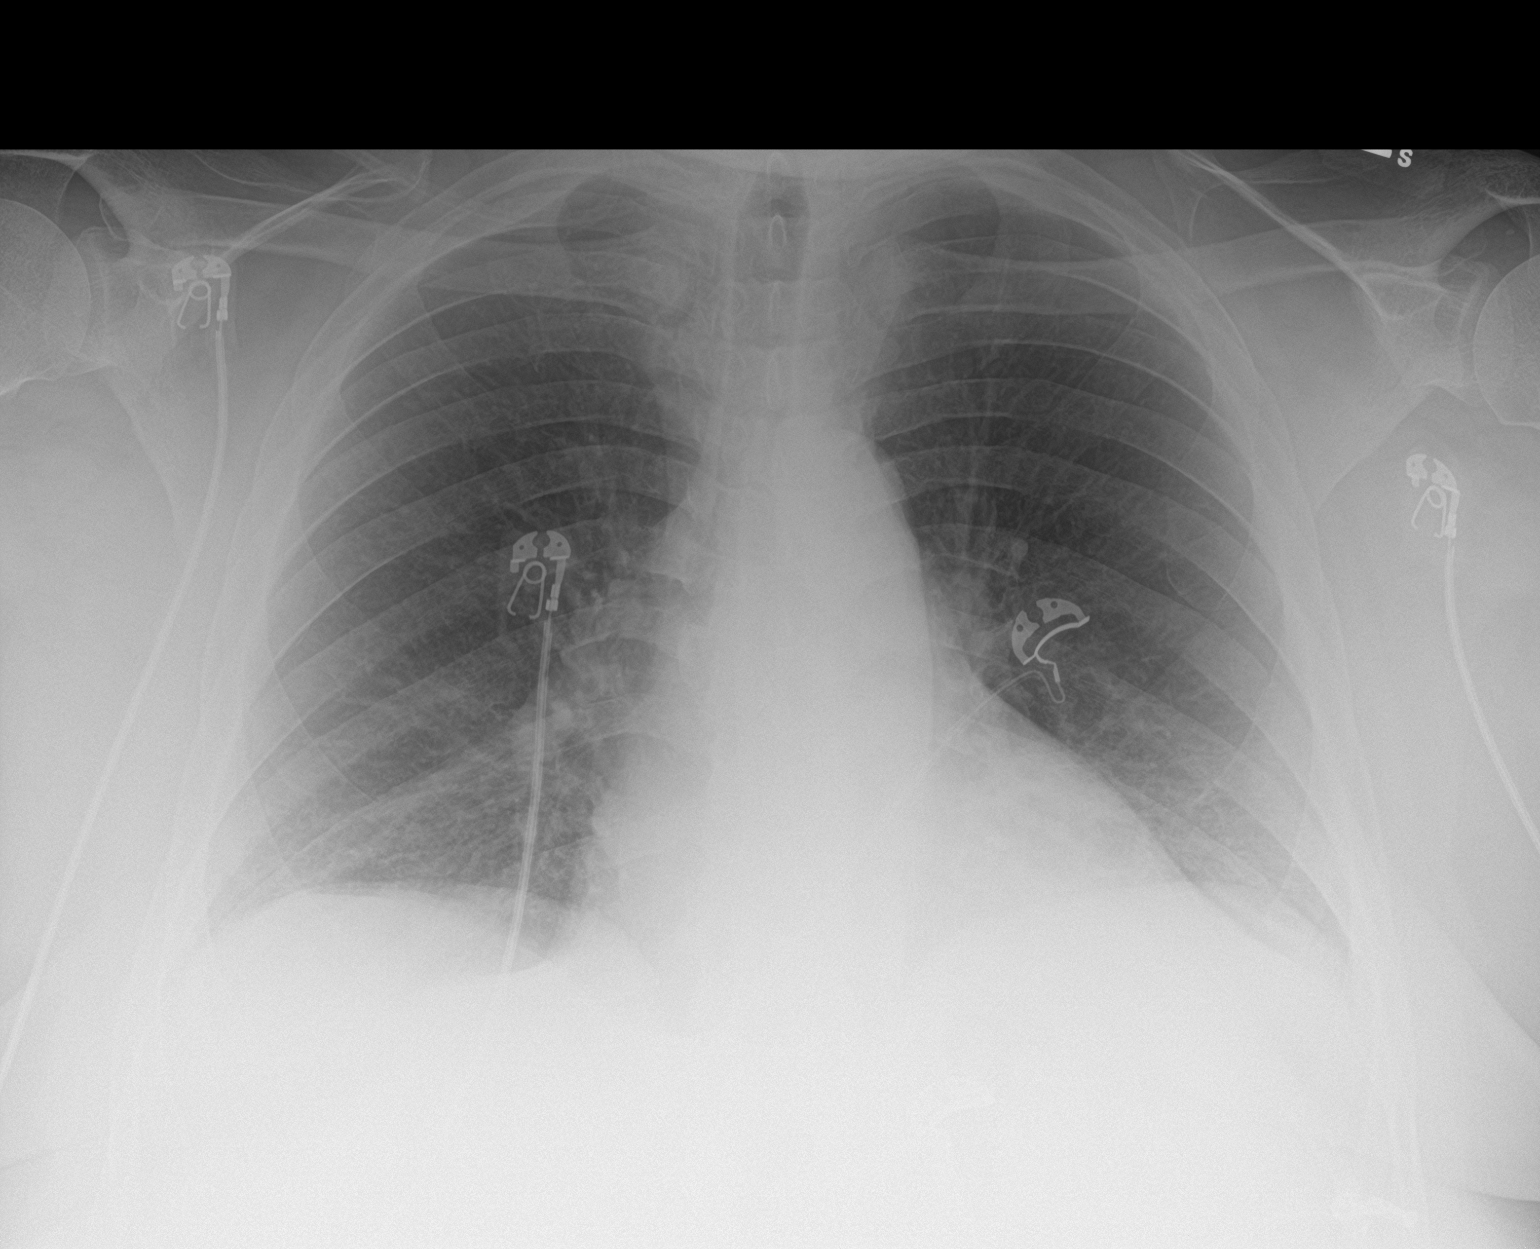

[chest lat]
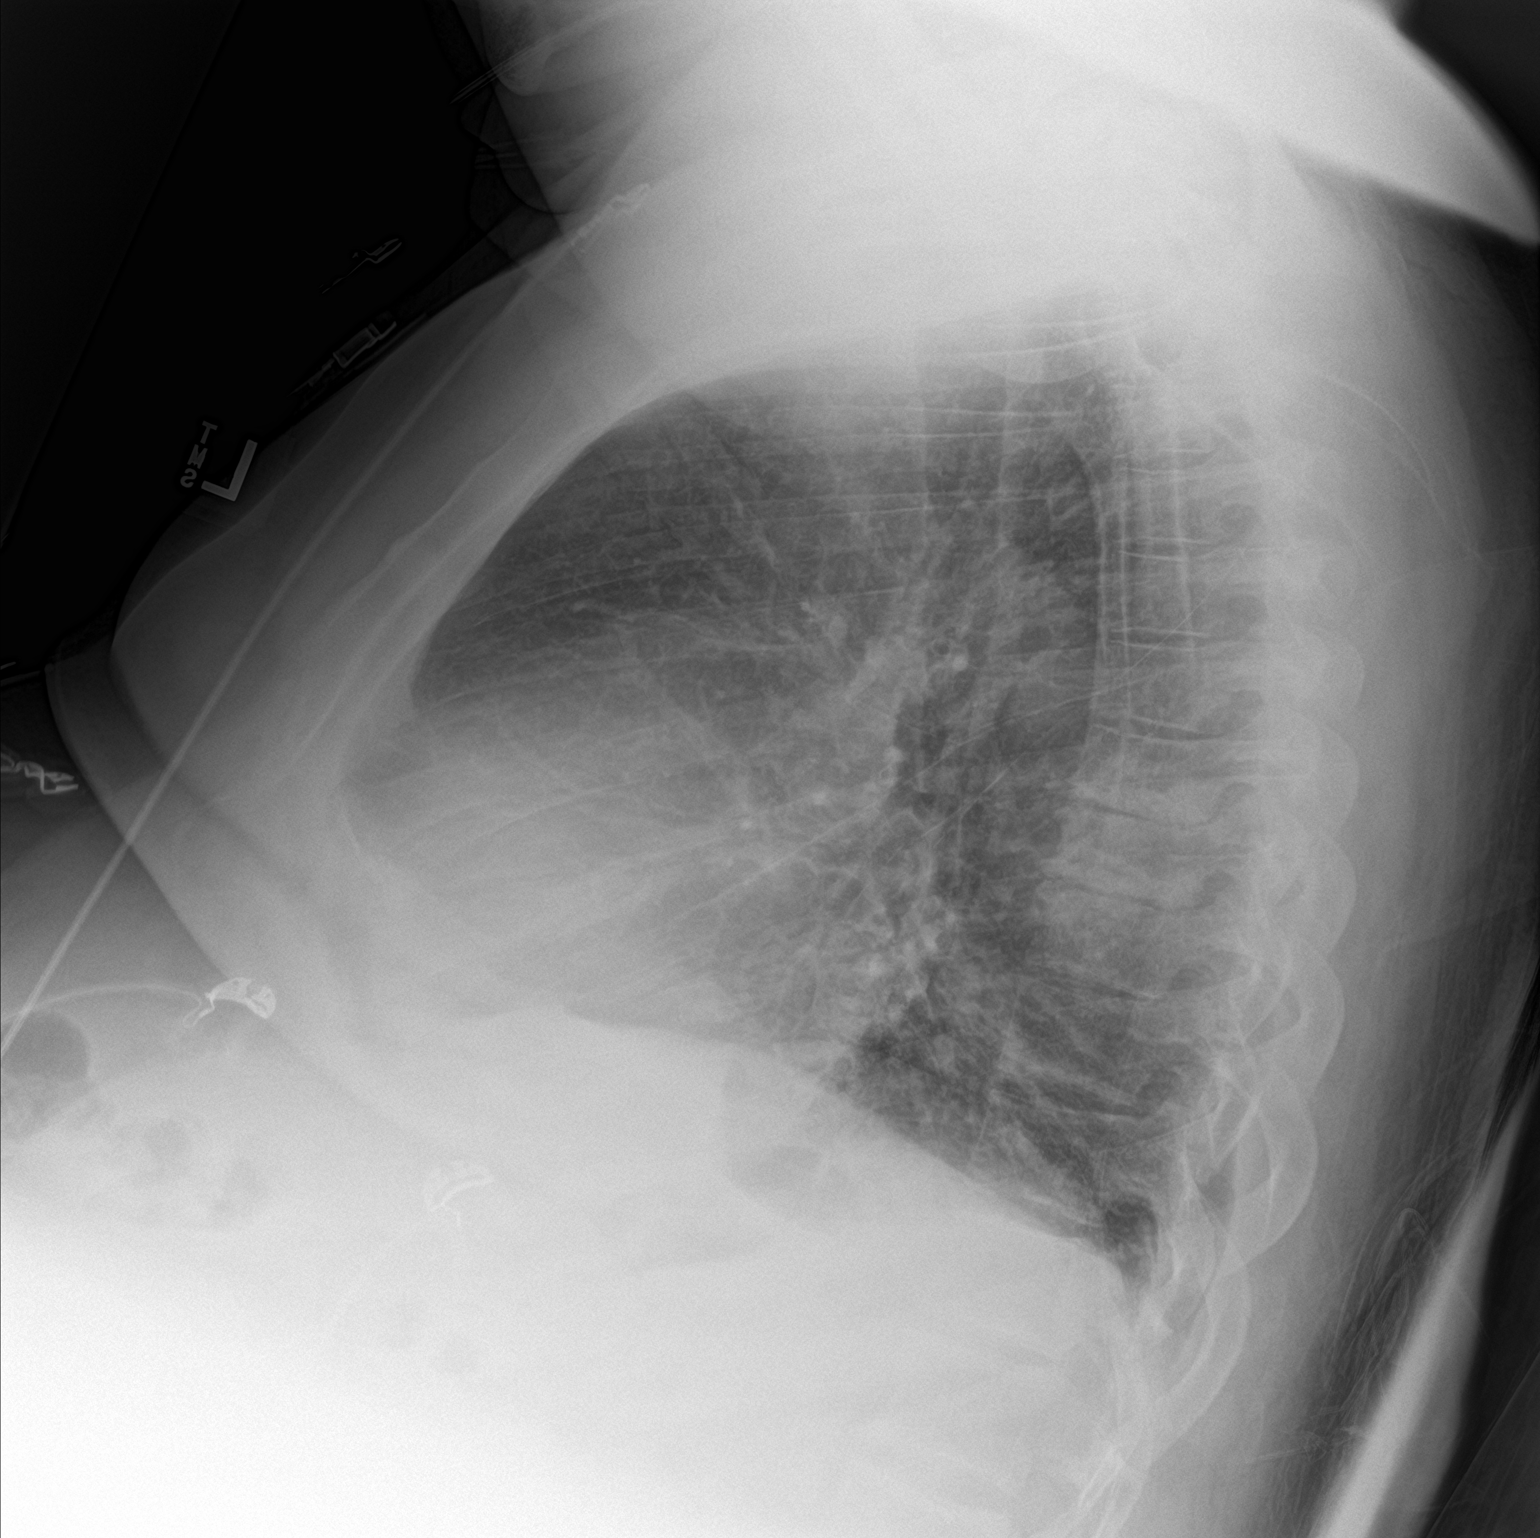

[2 of 2 positions shown; findings below may reference images not displayed]

FINDINGS: Upper normal heart size.

Mediastinal contours and pulmonary vascularity normal.

Mild bibasilar atelectasis.

Upper lungs clear.

No pleural effusion or pneumothorax.
IMPRESSION: Mild bibasilar atelectasis.
